# Patient Record
Sex: Female | Born: 1940
Health system: Southern US, Community
[De-identification: ages and names within clinical notes are randomized; demographics above are authoritative.]

## PROBLEM LIST (undated history)

## (undated) DIAGNOSIS — I10 Essential (primary) hypertension: Secondary | ICD-10-CM

## (undated) DIAGNOSIS — Z8719 Personal history of other diseases of the digestive system: Secondary | ICD-10-CM

## (undated) DIAGNOSIS — F32A Depression, unspecified: Secondary | ICD-10-CM

## (undated) DIAGNOSIS — R51 Headache: Secondary | ICD-10-CM

## (undated) DIAGNOSIS — K219 Gastro-esophageal reflux disease without esophagitis: Secondary | ICD-10-CM

## (undated) DIAGNOSIS — F419 Anxiety disorder, unspecified: Secondary | ICD-10-CM

## (undated) DIAGNOSIS — T4145XA Adverse effect of unspecified anesthetic, initial encounter: Secondary | ICD-10-CM

## (undated) DIAGNOSIS — C801 Malignant (primary) neoplasm, unspecified: Secondary | ICD-10-CM

## (undated) DIAGNOSIS — T8859XA Other complications of anesthesia, initial encounter: Secondary | ICD-10-CM

## (undated) DIAGNOSIS — F329 Major depressive disorder, single episode, unspecified: Secondary | ICD-10-CM

## (undated) HISTORY — PX: TEAR DUCT PROBING: SHX793

## (undated) HISTORY — PX: TONSILLECTOMY: SUR1361

## (undated) HISTORY — PX: HERNIA REPAIR: SHX51

## (undated) HISTORY — PX: APPENDECTOMY: SHX54

## (undated) HISTORY — PX: EYE SURGERY: SHX253

## (undated) HISTORY — PX: ABDOMINAL HYSTERECTOMY: SHX81

---

## 1998-01-19 ENCOUNTER — Ambulatory Visit (HOSPITAL_COMMUNITY): Admission: RE | Admit: 1998-01-19 | Discharge: 1998-01-19 | Payer: Self-pay | Admitting: *Deleted

## 1998-07-16 ENCOUNTER — Other Ambulatory Visit: Admission: RE | Admit: 1998-07-16 | Discharge: 1998-07-16 | Payer: Self-pay | Admitting: *Deleted

## 2005-05-15 ENCOUNTER — Emergency Department (HOSPITAL_COMMUNITY): Admission: EM | Admit: 2005-05-15 | Discharge: 2005-05-16 | Payer: Self-pay | Admitting: Emergency Medicine

## 2005-05-20 ENCOUNTER — Emergency Department (HOSPITAL_COMMUNITY): Admission: EM | Admit: 2005-05-20 | Discharge: 2005-05-20 | Payer: Self-pay | Admitting: Emergency Medicine

## 2006-08-04 ENCOUNTER — Encounter: Admission: RE | Admit: 2006-08-04 | Discharge: 2006-11-02 | Payer: Self-pay | Admitting: Cardiology

## 2006-10-06 ENCOUNTER — Ambulatory Visit (HOSPITAL_COMMUNITY): Admission: RE | Admit: 2006-10-06 | Discharge: 2006-10-07 | Payer: Self-pay | Admitting: Ophthalmology

## 2008-01-06 ENCOUNTER — Encounter: Admission: RE | Admit: 2008-01-06 | Discharge: 2008-01-06 | Payer: Self-pay | Admitting: General Surgery

## 2008-11-06 ENCOUNTER — Emergency Department (HOSPITAL_COMMUNITY): Admission: EM | Admit: 2008-11-06 | Discharge: 2008-11-07 | Payer: Self-pay | Admitting: Emergency Medicine

## 2008-11-15 ENCOUNTER — Encounter: Admission: RE | Admit: 2008-11-15 | Discharge: 2008-11-15 | Payer: Self-pay | Admitting: Internal Medicine

## 2009-04-27 ENCOUNTER — Emergency Department (HOSPITAL_COMMUNITY): Admission: EM | Admit: 2009-04-27 | Discharge: 2009-04-27 | Payer: Self-pay | Admitting: Emergency Medicine

## 2009-04-29 ENCOUNTER — Emergency Department (HOSPITAL_COMMUNITY): Admission: EM | Admit: 2009-04-29 | Discharge: 2009-04-29 | Payer: Self-pay | Admitting: Emergency Medicine

## 2009-05-30 ENCOUNTER — Emergency Department (HOSPITAL_COMMUNITY): Admission: EM | Admit: 2009-05-30 | Discharge: 2009-05-30 | Payer: Self-pay | Admitting: Emergency Medicine

## 2009-06-06 ENCOUNTER — Emergency Department (HOSPITAL_COMMUNITY): Admission: EM | Admit: 2009-06-06 | Discharge: 2009-06-06 | Payer: Self-pay | Admitting: Emergency Medicine

## 2009-10-13 ENCOUNTER — Emergency Department (HOSPITAL_COMMUNITY)
Admission: EM | Admit: 2009-10-13 | Discharge: 2009-10-13 | Payer: Self-pay | Source: Home / Self Care | Admitting: Emergency Medicine

## 2009-10-22 ENCOUNTER — Emergency Department (HOSPITAL_COMMUNITY): Admission: EM | Admit: 2009-10-22 | Discharge: 2009-10-22 | Payer: Self-pay | Admitting: Emergency Medicine

## 2009-10-27 ENCOUNTER — Emergency Department (HOSPITAL_COMMUNITY): Admission: EM | Admit: 2009-10-27 | Discharge: 2009-10-27 | Payer: Self-pay | Admitting: Emergency Medicine

## 2009-10-28 ENCOUNTER — Other Ambulatory Visit: Payer: Self-pay

## 2009-10-28 ENCOUNTER — Ambulatory Visit (HOSPITAL_COMMUNITY): Admission: RE | Admit: 2009-10-28 | Discharge: 2009-10-28 | Payer: Self-pay | Admitting: Psychiatry

## 2009-10-29 ENCOUNTER — Ambulatory Visit: Payer: Self-pay | Admitting: Psychiatry

## 2009-10-29 ENCOUNTER — Other Ambulatory Visit: Payer: Self-pay | Admitting: Emergency Medicine

## 2009-10-29 ENCOUNTER — Inpatient Hospital Stay (HOSPITAL_COMMUNITY): Admission: RE | Admit: 2009-10-29 | Discharge: 2009-11-02 | Payer: Self-pay | Admitting: Psychiatry

## 2009-11-19 ENCOUNTER — Other Ambulatory Visit: Payer: Self-pay | Admitting: Emergency Medicine

## 2009-11-20 ENCOUNTER — Inpatient Hospital Stay (HOSPITAL_COMMUNITY): Admission: AD | Admit: 2009-11-20 | Discharge: 2009-11-26 | Payer: Self-pay | Admitting: Psychiatry

## 2009-11-29 ENCOUNTER — Other Ambulatory Visit (HOSPITAL_COMMUNITY): Admission: RE | Admit: 2009-11-29 | Discharge: 2009-12-12 | Payer: Self-pay | Admitting: Psychiatry

## 2009-12-18 ENCOUNTER — Ambulatory Visit: Payer: Self-pay | Admitting: Psychiatry

## 2010-01-12 ENCOUNTER — Other Ambulatory Visit: Payer: Self-pay | Admitting: Emergency Medicine

## 2010-01-13 ENCOUNTER — Inpatient Hospital Stay (HOSPITAL_COMMUNITY): Admission: AD | Admit: 2010-01-13 | Discharge: 2010-01-18 | Payer: Self-pay | Admitting: Psychiatry

## 2010-02-04 ENCOUNTER — Other Ambulatory Visit: Payer: Self-pay

## 2010-02-05 ENCOUNTER — Ambulatory Visit: Payer: Self-pay | Admitting: Psychiatry

## 2010-02-05 ENCOUNTER — Inpatient Hospital Stay (HOSPITAL_COMMUNITY): Admission: EM | Admit: 2010-02-05 | Discharge: 2010-02-12 | Payer: Self-pay | Admitting: Psychiatry

## 2010-02-20 ENCOUNTER — Emergency Department (HOSPITAL_COMMUNITY): Admission: EM | Admit: 2010-02-20 | Discharge: 2010-02-21 | Payer: Self-pay | Admitting: Emergency Medicine

## 2010-03-12 ENCOUNTER — Ambulatory Visit: Payer: Self-pay | Admitting: Psychiatry

## 2010-04-03 ENCOUNTER — Encounter: Admission: RE | Admit: 2010-04-03 | Discharge: 2010-05-30 | Payer: Self-pay | Admitting: Internal Medicine

## 2010-07-04 ENCOUNTER — Emergency Department (HOSPITAL_COMMUNITY): Admission: EM | Admit: 2010-07-04 | Discharge: 2010-07-04 | Payer: Self-pay | Admitting: Emergency Medicine

## 2010-09-08 ENCOUNTER — Encounter: Payer: Self-pay | Admitting: Internal Medicine

## 2010-10-29 LAB — URINALYSIS, ROUTINE W REFLEX MICROSCOPIC
Bilirubin Urine: NEGATIVE
Ketones, ur: NEGATIVE mg/dL
Nitrite: NEGATIVE
Protein, ur: NEGATIVE mg/dL
Specific Gravity, Urine: 1.013 (ref 1.005–1.030)
Urobilinogen, UA: 0.2 mg/dL (ref 0.0–1.0)
pH: 7 (ref 5.0–8.0)

## 2010-10-29 LAB — COMPREHENSIVE METABOLIC PANEL
ALT: 16 U/L (ref 0–35)
Albumin: 3.9 g/dL (ref 3.5–5.2)
Alkaline Phosphatase: 47 U/L (ref 39–117)
CO2: 28 mEq/L (ref 19–32)
Calcium: 9.7 mg/dL (ref 8.4–10.5)
Chloride: 104 mEq/L (ref 96–112)
Sodium: 140 mEq/L (ref 135–145)

## 2010-10-29 LAB — DIFFERENTIAL
Basophils Absolute: 0 10*3/uL (ref 0.0–0.1)
Basophils Relative: 0 % (ref 0–1)
Eosinophils Absolute: 0.1 10*3/uL (ref 0.0–0.7)
Lymphs Abs: 0.9 10*3/uL (ref 0.7–4.0)
Monocytes Absolute: 0.6 10*3/uL (ref 0.1–1.0)
Monocytes Relative: 9 % (ref 3–12)
Neutrophils Relative %: 75 % (ref 43–77)

## 2010-10-29 LAB — URINE MICROSCOPIC-ADD ON

## 2010-10-29 LAB — CBC
MCHC: 34.9 g/dL (ref 30.0–36.0)
RBC: 4.53 MIL/uL (ref 3.87–5.11)

## 2010-11-03 LAB — CBC
HCT: 42.2 % (ref 36.0–46.0)
Hemoglobin: 14.8 g/dL (ref 12.0–15.0)
MCHC: 34 g/dL (ref 30.0–36.0)
MCV: 96.4 fL (ref 78.0–100.0)
MCV: 96 fL (ref 78.0–100.0)
RDW: 12.4 % (ref 11.5–15.5)

## 2010-11-03 LAB — URINALYSIS, ROUTINE W REFLEX MICROSCOPIC
Bilirubin Urine: NEGATIVE
Glucose, UA: NEGATIVE mg/dL
Ketones, ur: NEGATIVE mg/dL
Nitrite: NEGATIVE

## 2010-11-03 LAB — URINE MICROSCOPIC-ADD ON

## 2010-11-03 LAB — URINE CULTURE: Colony Count: 10000

## 2010-11-03 LAB — DIFFERENTIAL
Basophils Absolute: 0 10*3/uL (ref 0.0–0.1)
Basophils Relative: 0 % (ref 0–1)
Eosinophils Absolute: 0 10*3/uL (ref 0.0–0.7)
Eosinophils Relative: 1 % (ref 0–5)
Eosinophils Relative: 1 % (ref 0–5)
Lymphocytes Relative: 19 % (ref 12–46)
Lymphs Abs: 1.2 10*3/uL (ref 0.7–4.0)
Neutro Abs: 5.3 10*3/uL (ref 1.7–7.7)

## 2010-11-03 LAB — TRICYCLICS SCREEN, URINE
TCA Scrn: NOT DETECTED
TCA Scrn: NOT DETECTED

## 2010-11-03 LAB — RAPID URINE DRUG SCREEN, HOSP PERFORMED
Amphetamines: NOT DETECTED
Barbiturates: NOT DETECTED
Barbiturates: NOT DETECTED
Cocaine: NOT DETECTED
Opiates: NOT DETECTED
Tetrahydrocannabinol: NOT DETECTED

## 2010-11-03 LAB — BASIC METABOLIC PANEL
BUN: 20 mg/dL (ref 6–23)
BUN: 17 mg/dL (ref 6–23)
CO2: 26 mEq/L (ref 19–32)
Calcium: 9.7 mg/dL (ref 8.4–10.5)
Calcium: 9.8 mg/dL (ref 8.4–10.5)
Chloride: 99 mEq/L (ref 96–112)
Creatinine, Ser: 0.68 mg/dL (ref 0.4–1.2)
GFR calc non Af Amer: >60 mL/min (ref >60)
Glucose, Bld: 109 mg/dL — ABNORMAL HIGH (ref 70–99)
Potassium: 3.9 mEq/L (ref 3.5–5.1)
Sodium: 137 mEq/L (ref 135–145)

## 2010-11-04 LAB — DIFFERENTIAL
Eosinophils Absolute: 0.1 10*3/uL (ref 0.0–0.7)
Lymphocytes Relative: 16 % (ref 12–46)
Lymphs Abs: 1.2 10*3/uL (ref 0.7–4.0)
Monocytes Relative: 7 % (ref 3–12)
Neutro Abs: 5.3 10*3/uL (ref 1.7–7.7)
Neutrophils Relative %: 76 % (ref 43–77)

## 2010-11-04 LAB — BASIC METABOLIC PANEL
Chloride: 104 mEq/L (ref 96–112)
Creatinine, Ser: 0.72 mg/dL (ref 0.4–1.2)
GFR calc Af Amer: >60 mL/min (ref >60)
GFR calc non Af Amer: >60 mL/min (ref >60)

## 2010-11-04 LAB — ETHANOL: Alcohol, Ethyl (B): <5 mg/dL (ref 0–10)

## 2010-11-04 LAB — URINALYSIS, ROUTINE W REFLEX MICROSCOPIC
Nitrite: NEGATIVE
Specific Gravity, Urine: 1.014 (ref 1.005–1.030)
Urobilinogen, UA: 0.2 mg/dL (ref 0.0–1.0)
pH: 7.5 (ref 5.0–8.0)

## 2010-11-04 LAB — RAPID URINE DRUG SCREEN, HOSP PERFORMED
Benzodiazepines: POSITIVE — AB
Tetrahydrocannabinol: NOT DETECTED

## 2010-11-04 LAB — GLUCOSE, CAPILLARY
Glucose-Capillary: 118 mg/dL — ABNORMAL HIGH (ref 70–99)
Glucose-Capillary: 124 mg/dL — ABNORMAL HIGH (ref 70–99)

## 2010-11-04 LAB — CBC
MCV: 94.9 fL (ref 78.0–100.0)
Platelets: 237 10*3/uL (ref 150–400)
RBC: 4.55 MIL/uL (ref 3.87–5.11)
WBC: 7 10*3/uL (ref 4.0–10.5)

## 2010-11-04 LAB — URINE MICROSCOPIC-ADD ON

## 2010-11-06 LAB — URINALYSIS, ROUTINE W REFLEX MICROSCOPIC
Bilirubin Urine: NEGATIVE
Hgb urine dipstick: NEGATIVE
Hgb urine dipstick: NEGATIVE
Protein, ur: NEGATIVE mg/dL
Specific Gravity, Urine: 1.011 (ref 1.005–1.030)
Urobilinogen, UA: 0.2 mg/dL (ref 0.0–1.0)
Urobilinogen, UA: 0.2 mg/dL (ref 0.0–1.0)
pH: 7 (ref 5.0–8.0)

## 2010-11-06 LAB — CBC
MCHC: 33.7 g/dL (ref 30.0–36.0)
MCV: 95.8 fL (ref 78.0–100.0)
Platelets: 233 10*3/uL (ref 150–400)

## 2010-11-06 LAB — BASIC METABOLIC PANEL
BUN: 19 mg/dL (ref 6–23)
CO2: 29 mEq/L (ref 19–32)
Chloride: 105 mEq/L (ref 96–112)
Creatinine, Ser: 0.72 mg/dL (ref 0.4–1.2)
Glucose, Bld: 111 mg/dL — ABNORMAL HIGH (ref 70–99)

## 2010-11-06 LAB — DIFFERENTIAL
Basophils Relative: 0 % (ref 0–1)
Eosinophils Absolute: 0.1 10*3/uL (ref 0.0–0.7)
Monocytes Relative: 7 % (ref 3–12)
Neutrophils Relative %: 72 % (ref 43–77)

## 2010-11-06 LAB — URINE MICROSCOPIC-ADD ON

## 2010-11-06 LAB — RAPID URINE DRUG SCREEN, HOSP PERFORMED
Barbiturates: NOT DETECTED
Benzodiazepines: NOT DETECTED
Cocaine: NOT DETECTED

## 2010-11-06 LAB — GLUCOSE, CAPILLARY: Glucose-Capillary: 113 mg/dL — ABNORMAL HIGH (ref 70–99)

## 2010-11-10 LAB — POCT CARDIAC MARKERS
CKMB, poc: <1 ng/mL — ABNORMAL LOW (ref 1.0–8.0)
Myoglobin, poc: 59.5 ng/mL (ref 12–200)
Troponin i, poc: <0.05 ng/mL (ref 0.00–0.09)

## 2010-11-10 LAB — CBC
Hemoglobin: 14.4 g/dL (ref 12.0–15.0)
RDW: 11.7 % (ref 11.5–15.5)

## 2010-11-10 LAB — POCT I-STAT, CHEM 8
Glucose, Bld: 108 mg/dL — ABNORMAL HIGH (ref 70–99)
HCT: 42 % (ref 36.0–46.0)
Hemoglobin: 14.3 g/dL (ref 12.0–15.0)
Potassium: 3.8 mEq/L (ref 3.5–5.1)
Sodium: 139 mEq/L (ref 135–145)
TCO2: 28 mmol/L (ref 0–100)

## 2010-11-10 LAB — DIFFERENTIAL
Basophils Absolute: 0 10*3/uL (ref 0.0–0.1)
Lymphocytes Relative: 18 % (ref 12–46)
Monocytes Absolute: 0.6 10*3/uL (ref 0.1–1.0)
Neutro Abs: 5.6 10*3/uL (ref 1.7–7.7)
Neutrophils Relative %: 72 % (ref 43–77)

## 2010-11-11 LAB — CBC
HCT: 43.5 % (ref 36.0–46.0)
HCT: 44.4 % (ref 36.0–46.0)
Hemoglobin: 14.5 g/dL (ref 12.0–15.0)
Hemoglobin: 14.7 g/dL (ref 12.0–15.0)
MCV: 96.8 fL (ref 78.0–100.0)
MCV: 97.3 fL (ref 78.0–100.0)
Platelets: 241 10*3/uL (ref 150–400)
RBC: 4.47 MIL/uL (ref 3.87–5.11)
RDW: 12.1 % (ref 11.5–15.5)
WBC: 6.6 10*3/uL (ref 4.0–10.5)

## 2010-11-11 LAB — DIFFERENTIAL
Basophils Absolute: 0 10*3/uL (ref 0.0–0.1)
Basophils Relative: 0 % (ref 0–1)
Eosinophils Absolute: 0.1 10*3/uL (ref 0.0–0.7)
Eosinophils Relative: 2 % (ref 0–5)
Lymphocytes Relative: 16 % (ref 12–46)
Lymphocytes Relative: 17 % (ref 12–46)
Lymphs Abs: 1.1 10*3/uL (ref 0.7–4.0)
Monocytes Absolute: 0.5 10*3/uL (ref 0.1–1.0)
Monocytes Relative: 7 % (ref 3–12)
Neutro Abs: 4.7 10*3/uL (ref 1.7–7.7)
Neutrophils Relative %: 74 % (ref 43–77)

## 2010-11-11 LAB — COMPREHENSIVE METABOLIC PANEL
Albumin: 4.1 g/dL (ref 3.5–5.2)
Alkaline Phosphatase: 48 U/L (ref 39–117)
BUN: 16 mg/dL (ref 6–23)
Chloride: 104 mEq/L (ref 96–112)
Creatinine, Ser: 0.66 mg/dL (ref 0.4–1.2)
Glucose, Bld: 120 mg/dL — ABNORMAL HIGH (ref 70–99)
Potassium: 3.9 mEq/L (ref 3.5–5.1)
Total Bilirubin: 0.7 mg/dL (ref 0.3–1.2)

## 2010-11-11 LAB — ETHANOL
Alcohol, Ethyl (B): <5 mg/dL (ref 0–10)
Alcohol, Ethyl (B): <5 mg/dL (ref 0–10)

## 2010-11-11 LAB — BASIC METABOLIC PANEL
Chloride: 102 mEq/L (ref 96–112)
GFR calc Af Amer: >60 mL/min (ref >60)
GFR calc non Af Amer: >60 mL/min (ref >60)
Potassium: 4 mEq/L (ref 3.5–5.1)
Sodium: 138 mEq/L (ref 135–145)

## 2010-11-11 LAB — RAPID URINE DRUG SCREEN, HOSP PERFORMED
Barbiturates: NOT DETECTED
Benzodiazepines: NOT DETECTED
Cocaine: NOT DETECTED
Cocaine: NOT DETECTED
Opiates: NOT DETECTED
Tetrahydrocannabinol: NOT DETECTED

## 2010-11-21 LAB — DIFFERENTIAL
Basophils Absolute: 0 10*3/uL (ref 0.0–0.1)
Basophils Relative: 0 % (ref 0–1)
Basophils Relative: 0 % (ref 0–1)
Eosinophils Absolute: 0.1 10*3/uL (ref 0.0–0.7)
Eosinophils Absolute: 0.1 10*3/uL (ref 0.0–0.7)
Eosinophils Relative: 2 % (ref 0–5)
Monocytes Absolute: 0.4 10*3/uL (ref 0.1–1.0)
Monocytes Absolute: 0.5 10*3/uL (ref 0.1–1.0)
Monocytes Relative: 8 % (ref 3–12)
Monocytes Relative: 9 % (ref 3–12)
Neutrophils Relative %: 75 % (ref 43–77)

## 2010-11-21 LAB — CBC
HCT: 44.6 % (ref 36.0–46.0)
Hemoglobin: 15.6 g/dL — ABNORMAL HIGH (ref 12.0–15.0)
Hemoglobin: 14.9 g/dL (ref 12.0–15.0)
Platelets: 212 10*3/uL (ref 150–400)
RBC: 4.71 MIL/uL (ref 3.87–5.11)
RDW: 12.3 % (ref 11.5–15.5)
RDW: 12.2 % (ref 11.5–15.5)
WBC: 5.6 10*3/uL (ref 4.0–10.5)
WBC: 5.7 10*3/uL (ref 4.0–10.5)

## 2010-11-21 LAB — URINE MICROSCOPIC-ADD ON

## 2010-11-21 LAB — COMPREHENSIVE METABOLIC PANEL
ALT: 19 U/L (ref 0–35)
ALT: 18 U/L (ref 0–35)
Albumin: 4.1 g/dL (ref 3.5–5.2)
Alkaline Phosphatase: 44 U/L (ref 39–117)
Alkaline Phosphatase: 49 U/L (ref 39–117)
Chloride: 105 mEq/L (ref 96–112)
Chloride: 101 mEq/L (ref 96–112)
Glucose, Bld: 111 mg/dL — ABNORMAL HIGH (ref 70–99)
Potassium: 3.9 mEq/L (ref 3.5–5.1)
Potassium: 4.1 mEq/L (ref 3.5–5.1)
Sodium: 138 mEq/L (ref 135–145)
Sodium: 138 mEq/L (ref 135–145)
Total Bilirubin: 0.6 mg/dL (ref 0.3–1.2)
Total Bilirubin: 0.5 mg/dL (ref 0.3–1.2)
Total Protein: 6.6 g/dL (ref 6.0–8.3)
Total Protein: 6.6 g/dL (ref 6.0–8.3)

## 2010-11-21 LAB — URINALYSIS, ROUTINE W REFLEX MICROSCOPIC
Bilirubin Urine: NEGATIVE
Bilirubin Urine: NEGATIVE
Glucose, UA: NEGATIVE mg/dL
Glucose, UA: NEGATIVE mg/dL
Hgb urine dipstick: NEGATIVE
Ketones, ur: NEGATIVE mg/dL
Nitrite: NEGATIVE
Specific Gravity, Urine: 1.012 (ref 1.005–1.030)
Specific Gravity, Urine: 1.013 (ref 1.005–1.030)
Urobilinogen, UA: 0.2 mg/dL (ref 0.0–1.0)
pH: 6.5 (ref 5.0–8.0)
pH: 6.5 (ref 5.0–8.0)

## 2010-11-22 LAB — COMPREHENSIVE METABOLIC PANEL
ALT: 19 U/L (ref 0–35)
AST: 19 U/L (ref 0–37)
Albumin: 4.2 g/dL (ref 3.5–5.2)
Calcium: 9.5 mg/dL (ref 8.4–10.5)
GFR calc Af Amer: >60 mL/min (ref >60)
Glucose, Bld: 138 mg/dL — ABNORMAL HIGH (ref 70–99)
Sodium: 138 mEq/L (ref 135–145)
Total Protein: 6.9 g/dL (ref 6.0–8.3)

## 2010-11-22 LAB — URINALYSIS, ROUTINE W REFLEX MICROSCOPIC
Bilirubin Urine: NEGATIVE
Bilirubin Urine: NEGATIVE
Glucose, UA: NEGATIVE mg/dL
Ketones, ur: NEGATIVE mg/dL
Ketones, ur: NEGATIVE mg/dL
Nitrite: NEGATIVE
Specific Gravity, Urine: 1.016 (ref 1.005–1.030)
Urobilinogen, UA: 0.2 mg/dL (ref 0.0–1.0)
pH: 6 (ref 5.0–8.0)

## 2010-11-22 LAB — RAPID URINE DRUG SCREEN, HOSP PERFORMED
Amphetamines: NOT DETECTED
Barbiturates: NOT DETECTED
Benzodiazepines: POSITIVE — AB
Opiates: NOT DETECTED

## 2010-11-22 LAB — URINE CULTURE: Culture: NO GROWTH

## 2010-11-22 LAB — DIFFERENTIAL
Eosinophils Absolute: 0.1 10*3/uL (ref 0.0–0.7)
Eosinophils Relative: 2 % (ref 0–5)
Lymphocytes Relative: 15 % (ref 12–46)
Lymphs Abs: 1 10*3/uL (ref 0.7–4.0)
Lymphs Abs: 1.1 10*3/uL (ref 0.7–4.0)
Monocytes Absolute: 0.5 10*3/uL (ref 0.1–1.0)
Monocytes Absolute: 0.6 10*3/uL (ref 0.1–1.0)
Monocytes Relative: 6 % (ref 3–12)
Neutrophils Relative %: 79 % — ABNORMAL HIGH (ref 43–77)

## 2010-11-22 LAB — CBC
HCT: 42.1 % (ref 36.0–46.0)
Hemoglobin: 14.4 g/dL (ref 12.0–15.0)
MCHC: 34.2 g/dL (ref 30.0–36.0)
Platelets: 212 10*3/uL (ref 150–400)
RDW: 12.1 % (ref 11.5–15.5)
WBC: 7 10*3/uL (ref 4.0–10.5)

## 2010-11-22 LAB — POCT CARDIAC MARKERS: CKMB, poc: <1 ng/mL — ABNORMAL LOW (ref 1.0–8.0)

## 2010-11-22 LAB — BASIC METABOLIC PANEL
GFR calc Af Amer: >60 mL/min (ref >60)
GFR calc non Af Amer: >60 mL/min (ref >60)
Glucose, Bld: 126 mg/dL — ABNORMAL HIGH (ref 70–99)
Potassium: 4.1 mEq/L (ref 3.5–5.1)
Sodium: 137 mEq/L (ref 135–145)

## 2010-11-22 LAB — BRAIN NATRIURETIC PEPTIDE: Pro B Natriuretic peptide (BNP): 38 pg/mL (ref 0.0–100.0)

## 2010-11-28 LAB — BASIC METABOLIC PANEL
BUN: 18 mg/dL (ref 6–23)
CO2: 24 mEq/L (ref 19–32)
Calcium: 9.8 mg/dL (ref 8.4–10.5)
Creatinine, Ser: 0.67 mg/dL (ref 0.4–1.2)
GFR calc non Af Amer: >60 mL/min (ref >60)
Glucose, Bld: 157 mg/dL — ABNORMAL HIGH (ref 70–99)

## 2010-11-28 LAB — CBC
MCHC: 35.5 g/dL (ref 30.0–36.0)
Platelets: 257 10*3/uL (ref 150–400)
RDW: 11.5 % (ref 11.5–15.5)

## 2010-11-28 LAB — POCT CARDIAC MARKERS
Myoglobin, poc: 142 ng/mL (ref 12–200)
Troponin i, poc: <0.05 ng/mL (ref 0.00–0.09)

## 2010-11-28 LAB — URINALYSIS, ROUTINE W REFLEX MICROSCOPIC
Bilirubin Urine: NEGATIVE
Nitrite: NEGATIVE
Specific Gravity, Urine: 1.015 (ref 1.005–1.030)
pH: 7 (ref 5.0–8.0)

## 2010-11-28 LAB — URINE MICROSCOPIC-ADD ON

## 2010-11-28 LAB — DIFFERENTIAL
Eosinophils Absolute: 0.1 10*3/uL (ref 0.0–0.7)
Eosinophils Relative: 1 % (ref 0–5)
Lymphocytes Relative: 16 % (ref 12–46)
Lymphs Abs: 1.4 10*3/uL (ref 0.7–4.0)
Monocytes Relative: 11 % (ref 3–12)
Neutrophils Relative %: 71 % (ref 43–77)

## 2011-01-03 NOTE — Op Note (Signed)
NAMEJAMILYN, Monica Faulkner NO.:  [id number]   MEDICAL RECORD NO.:  [id number]          PATIENT TYPE:  AMB   LOCATION:  SDS                          FACILITY:  [facility]   PHYSICIAN:  John D. Ashley Royalty, M.D. DATE OF BIRTH:  Jun 11, 1941   DATE OF PROCEDURE:  10/06/2006  DATE OF DISCHARGE:                               OPERATIVE REPORT   ADMISSION DIAGNOSIS:  Macular hole right eye.   PROCEDURES:  Are pars plana vitrectomy with membrane peel, retinal  photocoagulation, gas fluid exchange with Perfluoron propane, serum  patch, and membrane peel, all in the right eye.   SURGEON:  Dr. Alan Mulder.   ASSISTANT:  Rosalie Doctor, MA   ANESTHESIA:  Is general.   DETAILS:  Usual prep and drape, peritomies at 8, 10, and 2 o'clock.  The  5-mm infusion port anchored into place at 8 o'clock.  The lighted pick  and the cutter placed at 10 and 2 o'clock respectively.  Contact lens  ring anchored into place at 6 and 12 o'clock.  Provisc placed on the  corneal surface and the flat contact lens was placed.  Pars plana  vitrectomy was begun just behind the crystalline lens. Membranes and  blood were encountered.  These were carefully removed under low suction  and rapid cutting.  The core vitrectomy was completed.  The silicone tip  suction line was placed into the eye, drawn down to the macular surface  where the fish strike sign occurred centrally and at the edge of the  macular region.  The posterior hyaloid was teased up and stripped from  its attachments to the macular hole with the lighted pick and the  cutter.  The vitrectomy is carried out to the far periphery with a 30  degrees prismatic lens. The magnifying contact lens was then placed and  the diamond dusted membrane scraper was used to remove the internal  limiting membrane from the edges of the hole.  Once this was complete  the instruments removed from the eye.  The indirect ophthalmoscope laser  was moved into place, 847  burns placed around the periphery with a power  of 400-560 milliwatts, 1000 microns each and 0.05-0.07 seconds each.  The instruments were repositioned in the eye and a gas fluid exchange  was carried out.  Sufficient time was allowed for additional fluids  track down the walls of the eye and collect in the posterior pole.  The  serum patch was prepared during this time in the Perfluoron propane was  mixed to 16%.  The additional fluid was removed with a [place] [employer]  brush.  Serum patch was delivered.  The Perfluoron propane was exchanged  for intravitreal gas.  The instruments were removed from the eye and 9-0  nylon was used to close the sclerotomy sites.  The conjunctiva was  closed with wet-field cautery.  Polymyxin and gentamicin were irrigated  into tenons space.  A Decadron 10 mg was injected to the lower  subconjunctival space.  Atropine solution was applied.  Marcaine was  injected around the globe for  postoperative  pain.  TobraDex ophthalmic ointment, patch and shield were  placed.  Closing pressure was 10 with a [place] tonometer.  Complications none.  Duration 1 hour.  The patient is awakened, taken to  recovery in satisfactory condition.      Beulah Gandy. Ashley Royalty, M.D.  Electronically Signed     JDM/MEDQ  D:  10/06/2006  T:  10/06/2006  Job:  [id number]

## 2011-06-13 ENCOUNTER — Emergency Department (HOSPITAL_COMMUNITY)
Admission: EM | Admit: 2011-06-13 | Discharge: 2011-06-13 | Disposition: A | Discharge status: Home / Self Care | Payer: Medicare Other | Attending: Emergency Medicine | Admitting: Emergency Medicine

## 2011-06-13 ENCOUNTER — Emergency Department (HOSPITAL_COMMUNITY): Payer: Medicare Other

## 2011-06-13 DIAGNOSIS — R112 Nausea with vomiting, unspecified: Secondary | ICD-10-CM | POA: Insufficient documentation

## 2011-06-13 DIAGNOSIS — I10 Essential (primary) hypertension: Secondary | ICD-10-CM | POA: Insufficient documentation

## 2011-06-13 DIAGNOSIS — R1013 Epigastric pain: Secondary | ICD-10-CM | POA: Insufficient documentation

## 2011-06-13 DIAGNOSIS — F411 Generalized anxiety disorder: Secondary | ICD-10-CM | POA: Insufficient documentation

## 2011-06-13 DIAGNOSIS — R1011 Right upper quadrant pain: Secondary | ICD-10-CM | POA: Insufficient documentation

## 2011-06-13 LAB — CBC
Hemoglobin: 14.2 g/dL (ref 12.0–15.0)
MCH: 30.9 pg (ref 26.0–34.0)
Platelets: 260 10*3/uL (ref 150–400)
RBC: 4.6 MIL/uL (ref 3.87–5.11)
WBC: 5.9 10*3/uL (ref 4.0–10.5)

## 2011-06-13 LAB — URINE MICROSCOPIC-ADD ON

## 2011-06-13 LAB — COMPREHENSIVE METABOLIC PANEL
ALT: 22 U/L (ref 0–35)
AST: 19 U/L (ref 0–37)
Albumin: 3.4 g/dL — ABNORMAL LOW (ref 3.5–5.2)
Alkaline Phosphatase: 64 U/L (ref 39–117)
Calcium: 9.7 mg/dL (ref 8.4–10.5)
GFR calc Af Amer: >90 mL/min (ref >90)
Glucose, Bld: 120 mg/dL — ABNORMAL HIGH (ref 70–99)
Potassium: 4.1 mEq/L (ref 3.5–5.1)
Sodium: 134 mEq/L — ABNORMAL LOW (ref 135–145)
Total Protein: 6.5 g/dL (ref 6.0–8.3)

## 2011-06-13 LAB — URINALYSIS, ROUTINE W REFLEX MICROSCOPIC
Bilirubin Urine: NEGATIVE
Hgb urine dipstick: NEGATIVE
Specific Gravity, Urine: 1.012 (ref 1.005–1.030)
Urobilinogen, UA: 0.2 mg/dL (ref 0.0–1.0)
pH: 7 (ref 5.0–8.0)

## 2011-06-13 LAB — DIFFERENTIAL
Basophils Absolute: 0 10*3/uL (ref 0.0–0.1)
Basophils Relative: 0 % (ref 0–1)
Eosinophils Absolute: 0.1 10*3/uL (ref 0.0–0.7)
Monocytes Relative: 14 % — ABNORMAL HIGH (ref 3–12)
Neutro Abs: 3.8 10*3/uL (ref 1.7–7.7)
Neutrophils Relative %: 64 % (ref 43–77)

## 2011-06-13 LAB — PROTIME-INR
INR: 1.03 (ref 0.00–1.49)
Prothrombin Time: 13.7 seconds (ref 11.6–15.2)

## 2011-06-13 LAB — APTT: aPTT: 31 seconds (ref 24–37)

## 2011-06-13 MED ORDER — IOHEXOL 300 MG/ML  SOLN
100.0000 mL | Freq: Once | INTRAMUSCULAR | Status: AC | PRN
  Administered 2011-06-13: 100 mL via INTRAVENOUS

## 2011-08-08 IMAGING — CT CT HEAD W/O CM
1 series · 16 of 30 positions shown, 20 images · non-contrast
Comparison: 11/06/2008

CLINICAL DATA: Nausea, ringing in left the year, dizziness, left
ear pain

CT HEAD WITHOUT CONTRAST
TECHNIQUE: Contiguous axial images were obtained from the base of
the skull through the vertex without contrast.

[Series 2: head routine 4.8 h37s · axial · 0.48mm/px · z∈[-66,+72]mm · 16 of 30 slices shown, 20 images]
[im 2/30  brain]
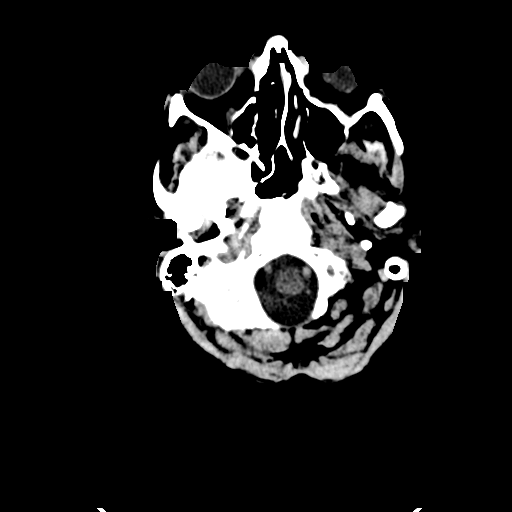
[im 2/30  bone]
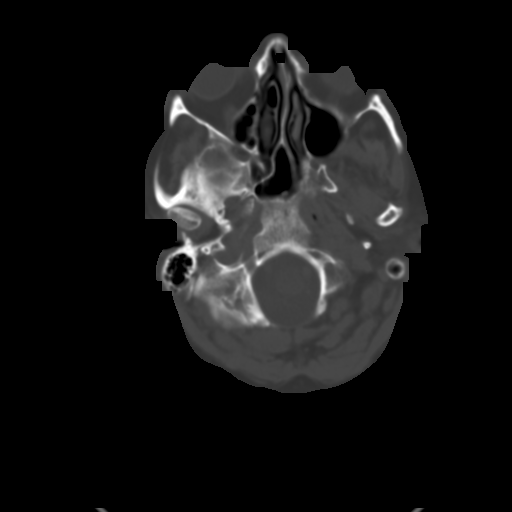
[im 4/30  brain]
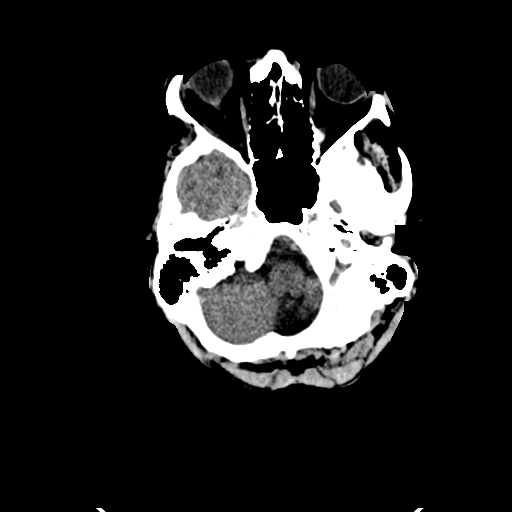
[im 6/30  brain]
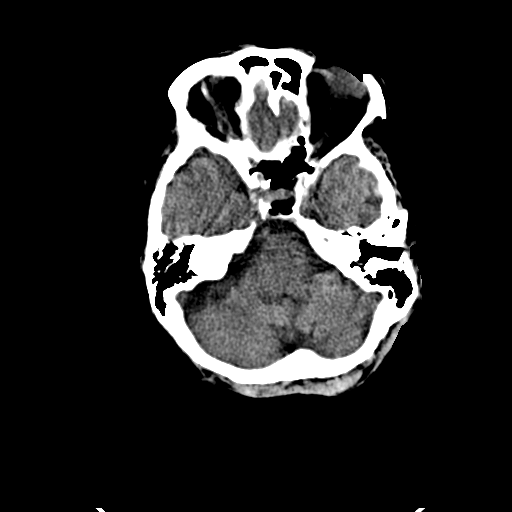
[im 8/30  brain]
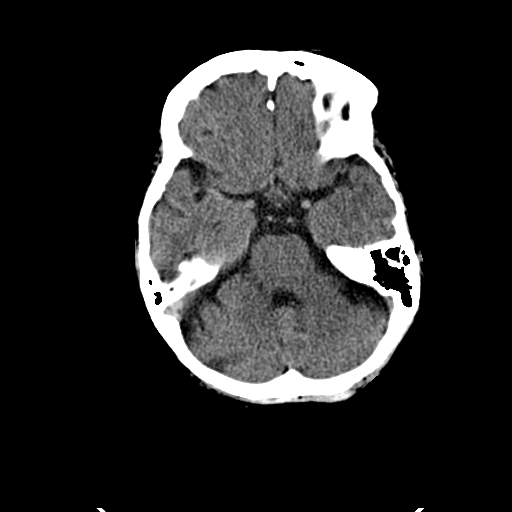
[im 9/30  brain]
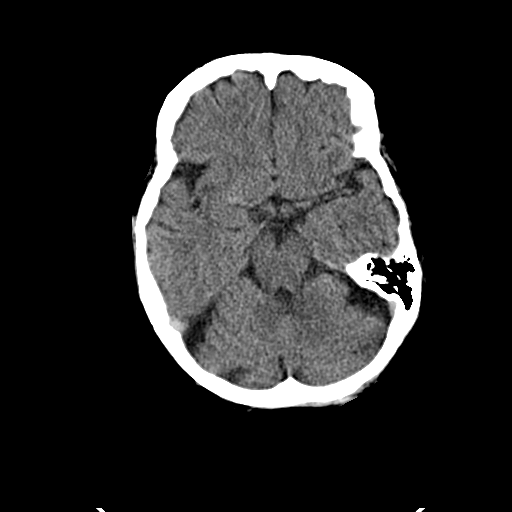
[im 9/30  bone]
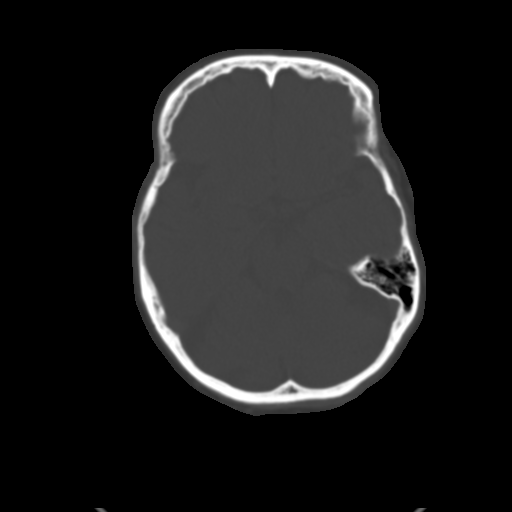
[im 11/30  brain]
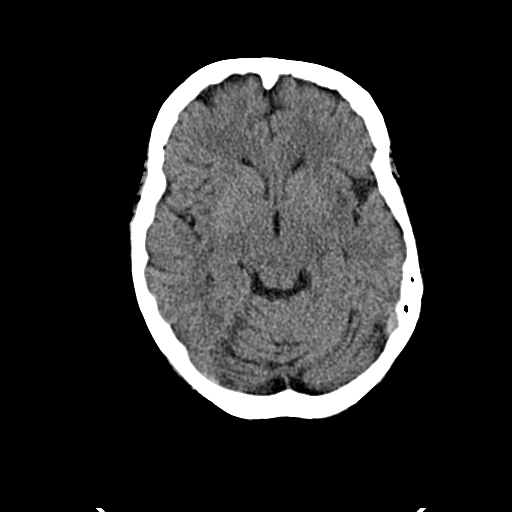
[im 13/30  brain]
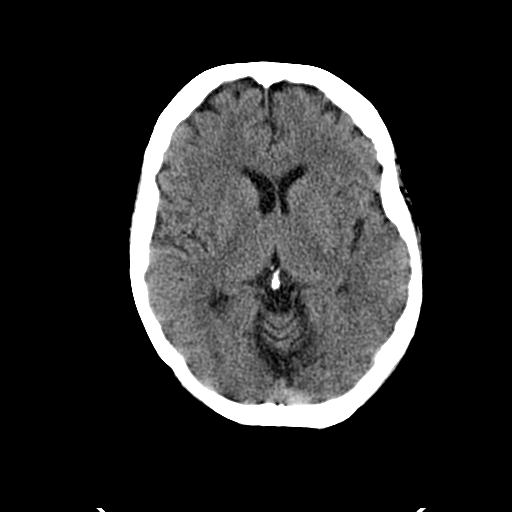
[im 15/30  brain]
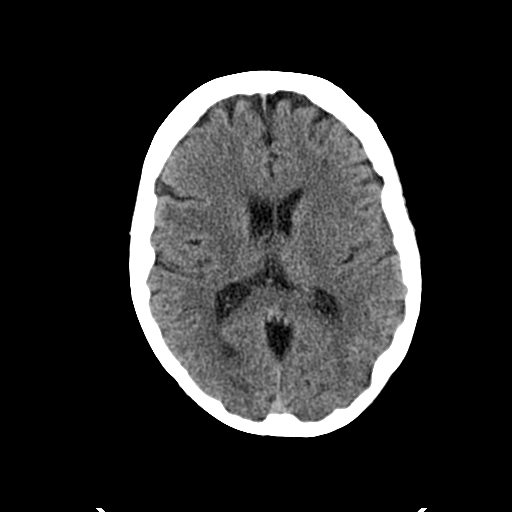
[im 16/30  brain]
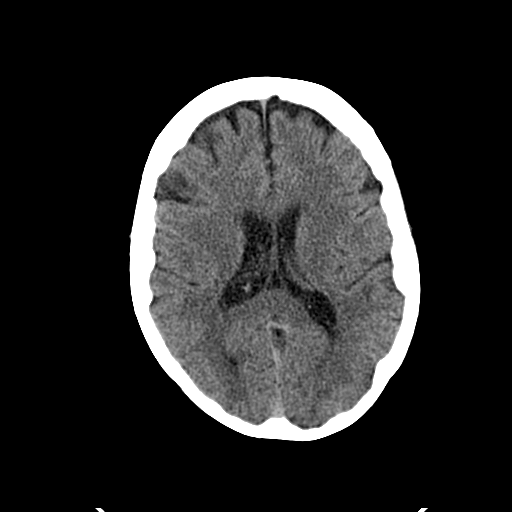
[im 16/30  bone]
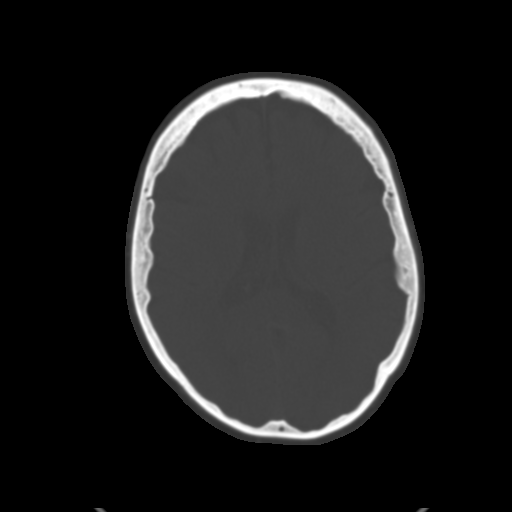
[im 18/30  brain]
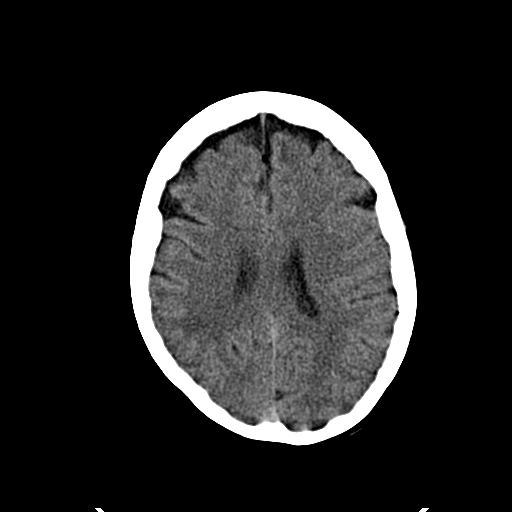
[im 20/30  brain]
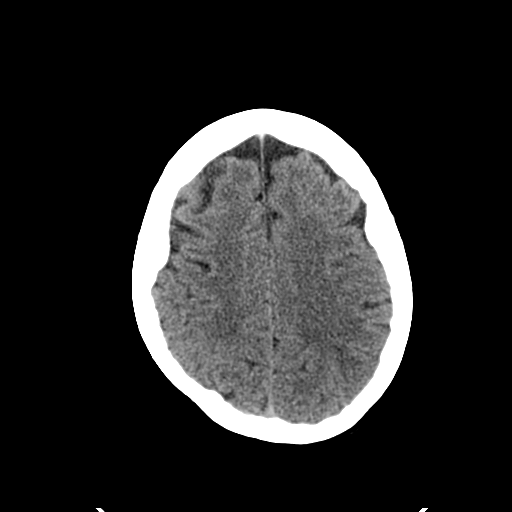
[im 22/30  brain]
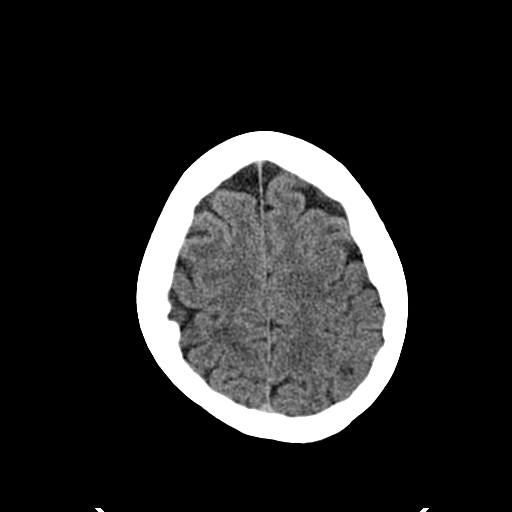
[im 23/30  brain]
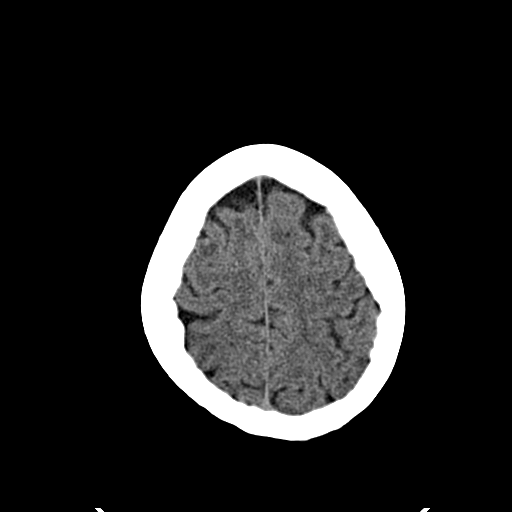
[im 23/30  bone]
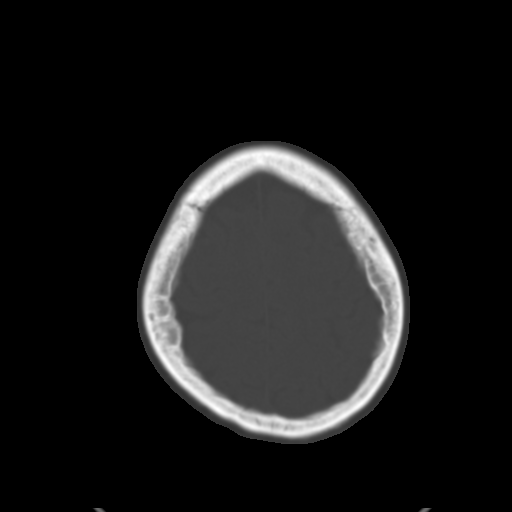
[im 25/30  brain]
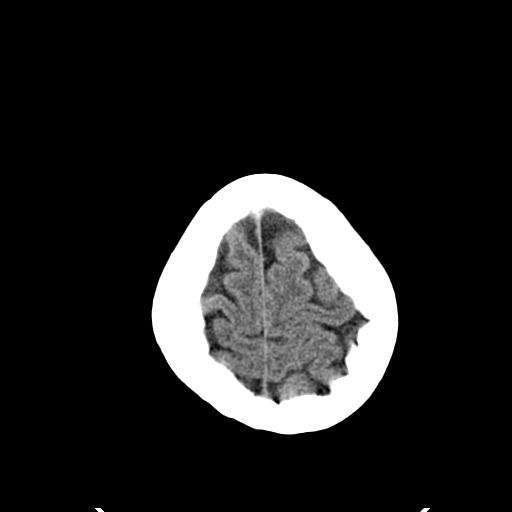
[im 27/30  brain]
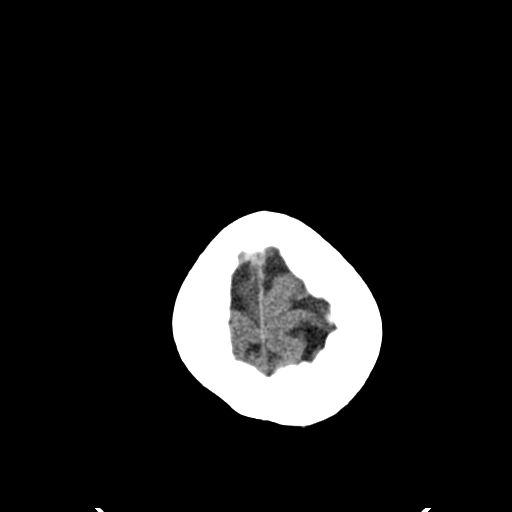
[im 29/30  brain]
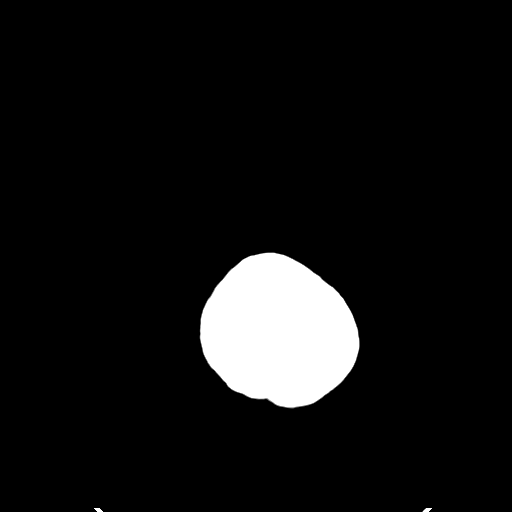

[16 of 30 positions shown; findings below may reference images not displayed]

FINDINGS: Mild generalized atrophy.
Normal ventricular morphology.
No midline shift or mass effect.
Minimal small vessel chronic ischemic changes of deep cerebral
white matter.
Otherwise normal appearance of brain parenchyma.
No intracranial hemorrhage, mass lesion, or acute infarction.
Visualized paranasal sinuses clear.
Bones unremarkable.
IMPRESSION: Atrophy with minimal small vessel chronic ischemic changes of deep
cerebral white matter.
No acute intracranial abnormalities.

## 2011-09-29 ENCOUNTER — Encounter (INDEPENDENT_AMBULATORY_CARE_PROVIDER_SITE_OTHER): Payer: BC Managed Care – PPO | Admitting: Ophthalmology

## 2011-10-13 ENCOUNTER — Encounter (INDEPENDENT_AMBULATORY_CARE_PROVIDER_SITE_OTHER): Payer: BC Managed Care – PPO | Admitting: Ophthalmology

## 2012-01-08 ENCOUNTER — Encounter (INDEPENDENT_AMBULATORY_CARE_PROVIDER_SITE_OTHER): Payer: Medicare Other | Admitting: Ophthalmology

## 2012-02-04 ENCOUNTER — Encounter (INDEPENDENT_AMBULATORY_CARE_PROVIDER_SITE_OTHER): Payer: Medicare Other | Admitting: Ophthalmology

## 2012-02-16 ENCOUNTER — Encounter (INDEPENDENT_AMBULATORY_CARE_PROVIDER_SITE_OTHER): Payer: Medicare Other | Admitting: Ophthalmology

## 2012-02-16 DIAGNOSIS — H43819 Vitreous degeneration, unspecified eye: Secondary | ICD-10-CM

## 2012-02-16 DIAGNOSIS — H2510 Age-related nuclear cataract, unspecified eye: Secondary | ICD-10-CM

## 2012-02-16 DIAGNOSIS — H35349 Macular cyst, hole, or pseudohole, unspecified eye: Secondary | ICD-10-CM

## 2012-02-18 DIAGNOSIS — H35349 Macular cyst, hole, or pseudohole, unspecified eye: Secondary | ICD-10-CM | POA: Diagnosis present

## 2012-02-18 NOTE — H&P (Signed)
Monica Faulkner is an 71 y.o. female.   Chief Complaint:Loss of vision left eye HPI: longstanding macular hole left eye.  Has successful repair of macular hole in right eye  No past medical history on file.   past surgical history:  Tear Duct surgery, Tonsils and Adnoids surgery, ovarian cyst surgery, Hernia surgery, right eye macular hole repair surgery   No family history on file. Social History:  does not have a smoking history on file. She does not have any smokeless tobacco history on file. Her alcohol and drug histories not on file.  Allergies: Allergies not on file  No prescriptions prior to admission    Review of systems otherwise negative  There were no vitals taken for this visit.  Physical exam: Mental status: oriented x3. Eyes: See eye exam associated with this date of surgery in media tab.  Scanned in by scanning center Ears, Nose, Throat: within normal limits Neck: Within Normal limits General: within normal limits Chest: Within normal limits Breast: deferred Heart: Within normal limits Abdomen: Within normal limits GU: deferred Extremities: within normal limits Skin: within normal limits  Assessment/Plan Macular hole left eye Plan: To [facility] for Pars plana vitrectomy, membrane peel, serum patch, gas injection left eye  Orin Eberwein D 02/18/2012, 12:02 PM

## 2012-03-08 ENCOUNTER — Encounter (HOSPITAL_COMMUNITY): Payer: Self-pay | Admitting: Respiratory Therapy

## 2012-03-16 ENCOUNTER — Encounter (HOSPITAL_COMMUNITY): Payer: Self-pay

## 2012-03-17 MED ORDER — CYCLOPENTOLATE HCL 1 % OP SOLN
1.0000 [drp] | OPHTHALMIC | Status: DC | PRN
  Filled 2012-03-17: qty 2

## 2012-03-17 MED ORDER — CEFAZOLIN SODIUM-DEXTROSE 2-3 GM-% IV SOLR
2.0000 g | INTRAVENOUS | Status: DC
  Filled 2012-03-17: qty 50

## 2012-03-17 MED ORDER — PHENYLEPHRINE HCL 2.5 % OP SOLN
1.0000 [drp] | OPHTHALMIC | Status: DC | PRN
  Filled 2012-03-17: qty 3

## 2012-03-17 MED ORDER — TROPICAMIDE 1 % OP SOLN
1.0000 [drp] | OPHTHALMIC | Status: DC | PRN
  Filled 2012-03-17: qty 3

## 2012-03-17 MED ORDER — GATIFLOXACIN 0.5 % OP SOLN
1.0000 [drp] | OPHTHALMIC | Status: DC | PRN
  Filled 2012-03-17: qty 2.5

## 2012-03-18 ENCOUNTER — Encounter (HOSPITAL_COMMUNITY): Payer: Self-pay | Admitting: General Practice

## 2012-03-18 ENCOUNTER — Encounter (HOSPITAL_COMMUNITY)
Admission: RE | Disposition: A | Discharge status: Home / Self Care | Payer: Self-pay | Source: Ambulatory Visit | Attending: Ophthalmology

## 2012-03-18 ENCOUNTER — Encounter (HOSPITAL_COMMUNITY): Payer: Self-pay | Admitting: *Deleted

## 2012-03-18 ENCOUNTER — Ambulatory Visit (HOSPITAL_COMMUNITY)
Admission: RE | Admit: 2012-03-18 | Discharge: 2012-03-19 | Disposition: A | Discharge status: Home / Self Care | Payer: Medicare Other | Source: Ambulatory Visit | Attending: Ophthalmology | Admitting: Ophthalmology

## 2012-03-18 ENCOUNTER — Ambulatory Visit (HOSPITAL_COMMUNITY): Payer: Medicare Other | Admitting: *Deleted

## 2012-03-18 ENCOUNTER — Ambulatory Visit (HOSPITAL_COMMUNITY): Payer: Medicare Other

## 2012-03-18 DIAGNOSIS — H35349 Macular cyst, hole, or pseudohole, unspecified eye: Secondary | ICD-10-CM

## 2012-03-18 DIAGNOSIS — F329 Major depressive disorder, single episode, unspecified: Secondary | ICD-10-CM | POA: Insufficient documentation

## 2012-03-18 DIAGNOSIS — I10 Essential (primary) hypertension: Secondary | ICD-10-CM | POA: Insufficient documentation

## 2012-03-18 DIAGNOSIS — F3289 Other specified depressive episodes: Secondary | ICD-10-CM | POA: Insufficient documentation

## 2012-03-18 DIAGNOSIS — K219 Gastro-esophageal reflux disease without esophagitis: Secondary | ICD-10-CM | POA: Insufficient documentation

## 2012-03-18 DIAGNOSIS — F411 Generalized anxiety disorder: Secondary | ICD-10-CM | POA: Insufficient documentation

## 2012-03-18 HISTORY — DX: Adverse effect of unspecified anesthetic, initial encounter: T41.45XA

## 2012-03-18 HISTORY — DX: Anxiety disorder, unspecified: F41.9

## 2012-03-18 HISTORY — DX: Essential (primary) hypertension: I10

## 2012-03-18 HISTORY — DX: Depression, unspecified: F32.A

## 2012-03-18 HISTORY — DX: Malignant (primary) neoplasm, unspecified: C80.1

## 2012-03-18 HISTORY — PX: GAS INSERTION: SHX5336

## 2012-03-18 HISTORY — DX: Major depressive disorder, single episode, unspecified: F32.9

## 2012-03-18 HISTORY — DX: Gastro-esophageal reflux disease without esophagitis: K21.9

## 2012-03-18 HISTORY — DX: Other complications of anesthesia, initial encounter: T88.59XA

## 2012-03-18 HISTORY — DX: Headache: R51

## 2012-03-18 HISTORY — PX: PARS PLANA VITRECTOMY: SHX2166

## 2012-03-18 HISTORY — PX: VITRECTOMY: SHX106

## 2012-03-18 HISTORY — DX: Personal history of other diseases of the digestive system: Z87.19

## 2012-03-18 LAB — CBC
HCT: 41.3 % (ref 36.0–46.0)
Hemoglobin: 14 g/dL (ref 12.0–15.0)
MCV: 89 fL (ref 78.0–100.0)
RBC: 4.64 MIL/uL (ref 3.87–5.11)
WBC: 7 10*3/uL (ref 4.0–10.5)

## 2012-03-18 LAB — AUTOLOGOUS SERUM PATCH PREP

## 2012-03-18 LAB — BASIC METABOLIC PANEL
CO2: 28 mEq/L (ref 19–32)
Chloride: 103 mEq/L (ref 96–112)
Creatinine, Ser: 0.67 mg/dL (ref 0.50–1.10)
GFR calc Af Amer: >90 mL/min (ref >90)
Potassium: 4 mEq/L (ref 3.5–5.1)
Sodium: 139 mEq/L (ref 135–145)

## 2012-03-18 LAB — SURGICAL PCR SCREEN: Staphylococcus aureus: NEGATIVE

## 2012-03-18 SURGERY — PARS PLANA VITRECTOMY WITH 25 GAUGE
Anesthesia: General | Site: Eye | Laterality: Left | Wound class: Clean

## 2012-03-18 MED ORDER — STERILE WATER FOR IRRIGATION IR SOLN
Status: DC | PRN
  Administered 2012-03-18: 1000 mL

## 2012-03-18 MED ORDER — EPHEDRINE SULFATE 50 MG/ML IJ SOLN
INTRAMUSCULAR | Status: DC | PRN
  Administered 2012-03-18: 10 mg via INTRAVENOUS
  Administered 2012-03-18: 5 mg via INTRAVENOUS

## 2012-03-18 MED ORDER — FENTANYL CITRATE 0.05 MG/ML IJ SOLN
INTRAMUSCULAR | Status: DC | PRN
  Administered 2012-03-18: 50 ug via INTRAVENOUS
  Administered 2012-03-18: 150 ug via INTRAVENOUS

## 2012-03-18 MED ORDER — DEXAMETHASONE SODIUM PHOSPHATE 10 MG/ML IJ SOLN
INTRAMUSCULAR | Status: AC
  Filled 2012-03-18: qty 1

## 2012-03-18 MED ORDER — GATIFLOXACIN 0.5 % OP SOLN
1.0000 [drp] | Freq: Four times a day (QID) | OPHTHALMIC | Status: DC
  Administered 2012-03-19: 1 [drp] via OPHTHALMIC
  Filled 2012-03-18: qty 2.5

## 2012-03-18 MED ORDER — LISINOPRIL 40 MG PO TABS
40.0000 mg | ORAL_TABLET | Freq: Two times a day (BID) | ORAL | Status: DC
  Administered 2012-03-18 – 2012-03-19 (×2): 40 mg via ORAL
  Filled 2012-03-18 (×4): qty 1

## 2012-03-18 MED ORDER — ONDANSETRON HCL 4 MG/2ML IJ SOLN: 4.0000 mg | Freq: Once | INTRAMUSCULAR | Status: DC | PRN

## 2012-03-18 MED ORDER — ACETAMINOPHEN 325 MG PO TABS: 325.0000 mg | ORAL_TABLET | ORAL | Status: DC | PRN

## 2012-03-18 MED ORDER — BSS PLUS IO SOLN
INTRAOCULAR | Status: DC | PRN
  Administered 2012-03-18: 1 via INTRAOCULAR

## 2012-03-18 MED ORDER — LATANOPROST 0.005 % OP SOLN
1.0000 [drp] | Freq: Every day | OPHTHALMIC | Status: DC
  Filled 2012-03-18: qty 2.5

## 2012-03-18 MED ORDER — HYDROMORPHONE HCL PF 1 MG/ML IJ SOLN
0.2500 mg | INTRAMUSCULAR | Status: DC | PRN
  Administered 2012-03-18: 0.5 mg via INTRAVENOUS

## 2012-03-18 MED ORDER — BACITRACIN-POLYMYXIN B 500-10000 UNIT/GM OP OINT
1.0000 "application " | TOPICAL_OINTMENT | Freq: Four times a day (QID) | OPHTHALMIC | Status: DC
  Administered 2012-03-19: 1 via OPHTHALMIC
  Filled 2012-03-18: qty 3.5

## 2012-03-18 MED ORDER — EPINEPHRINE HCL 1 MG/ML IJ SOLN
INTRAMUSCULAR | Status: AC
  Filled 2012-03-18: qty 1

## 2012-03-18 MED ORDER — BACITRACIN-POLYMYXIN B 500-10000 UNIT/GM OP OINT
TOPICAL_OINTMENT | OPHTHALMIC | Status: AC
  Filled 2012-03-18: qty 3.5

## 2012-03-18 MED ORDER — POLYMYXIN B SULFATE 500000 UNITS IJ SOLR
INTRAMUSCULAR | Status: AC
  Filled 2012-03-18: qty 1

## 2012-03-18 MED ORDER — SODIUM CHLORIDE 0.45 % IV SOLN
INTRAVENOUS | Status: DC
  Administered 2012-03-19: 02:00:00 via INTRAVENOUS

## 2012-03-18 MED ORDER — SODIUM CHLORIDE 0.9 % IV SOLN
INTRAVENOUS | Status: DC
  Administered 2012-03-18: 12:00:00 via INTRAVENOUS

## 2012-03-18 MED ORDER — PROPOFOL 10 MG/ML IV EMUL
INTRAVENOUS | Status: DC | PRN
  Administered 2012-03-18: 110 mg via INTRAVENOUS

## 2012-03-18 MED ORDER — TETRACAINE HCL 0.5 % OP SOLN
2.0000 [drp] | Freq: Once | OPHTHALMIC | Status: DC
  Filled 2012-03-18: qty 2

## 2012-03-18 MED ORDER — ATROPINE SULFATE 1 % OP SOLN
OPHTHALMIC | Status: AC
  Filled 2012-03-18: qty 2

## 2012-03-18 MED ORDER — POLYMYXIN B SULFATE 500000 UNITS IJ SOLR
INTRAMUSCULAR | Status: DC | PRN
  Administered 2012-03-18: 12:00:00

## 2012-03-18 MED ORDER — DEXAMETHASONE SODIUM PHOSPHATE 10 MG/ML IJ SOLN
INTRAMUSCULAR | Status: DC | PRN
  Administered 2012-03-18: 10 mg

## 2012-03-18 MED ORDER — PROVISC 10 MG/ML IO SOLN
INTRAOCULAR | Status: DC | PRN
  Administered 2012-03-18: .85 mL via INTRAOCULAR

## 2012-03-18 MED ORDER — HYDROMORPHONE HCL PF 1 MG/ML IJ SOLN
INTRAMUSCULAR | Status: AC
  Filled 2012-03-18: qty 1

## 2012-03-18 MED ORDER — BUPIVACAINE HCL 0.75 % IJ SOLN
INTRAMUSCULAR | Status: AC
  Filled 2012-03-18: qty 10

## 2012-03-18 MED ORDER — ESCITALOPRAM OXALATE 10 MG PO TABS
10.0000 mg | ORAL_TABLET | Freq: Every day | ORAL | Status: DC
  Administered 2012-03-19: 10 mg via ORAL
  Filled 2012-03-18: qty 1

## 2012-03-18 MED ORDER — SODIUM CHLORIDE 0.9 % IR SOLN
Status: DC | PRN
  Administered 2012-03-18: 1

## 2012-03-18 MED ORDER — BSS IO SOLN
INTRAOCULAR | Status: DC | PRN
  Administered 2012-03-18: 15 mL via INTRAOCULAR

## 2012-03-18 MED ORDER — VECURONIUM BROMIDE 10 MG IV SOLR
INTRAVENOUS | Status: DC | PRN
  Administered 2012-03-18: 1 mg via INTRAVENOUS

## 2012-03-18 MED ORDER — PHENYLEPHRINE HCL 2.5 % OP SOLN
1.0000 [drp] | OPHTHALMIC | Status: AC | PRN
  Administered 2012-03-18 (×3): 1 [drp] via OPHTHALMIC

## 2012-03-18 MED ORDER — ACETAZOLAMIDE SODIUM 500 MG IJ SOLR
500.0000 mg | Freq: Once | INTRAMUSCULAR | Status: AC
  Administered 2012-03-19: 500 mg via INTRAVENOUS
  Filled 2012-03-18: qty 500

## 2012-03-18 MED ORDER — MUPIROCIN 2 % EX OINT
TOPICAL_OINTMENT | CUTANEOUS | Status: AC
  Administered 2012-03-18: 1 via NASAL
  Filled 2012-03-18: qty 22

## 2012-03-18 MED ORDER — GATIFLOXACIN 0.5 % OP SOLN
1.0000 [drp] | OPHTHALMIC | Status: DC | PRN
  Administered 2012-03-18 (×2): 1 [drp] via OPHTHALMIC

## 2012-03-18 MED ORDER — TROPICAMIDE 1 % OP SOLN
1.0000 [drp] | OPHTHALMIC | Status: AC | PRN
  Administered 2012-03-18 (×3): 1 [drp] via OPHTHALMIC

## 2012-03-18 MED ORDER — DOCUSATE SODIUM 100 MG PO CAPS
100.0000 mg | ORAL_CAPSULE | Freq: Two times a day (BID) | ORAL | Status: DC
  Administered 2012-03-19: 100 mg via ORAL
  Filled 2012-03-18 (×2): qty 1

## 2012-03-18 MED ORDER — HYPROMELLOSE (GONIOSCOPIC) 2.5 % OP SOLN
OPHTHALMIC | Status: AC
  Filled 2012-03-18: qty 15

## 2012-03-18 MED ORDER — ATENOLOL 25 MG PO TABS
25.0000 mg | ORAL_TABLET | Freq: Every evening | ORAL | Status: DC
  Administered 2012-03-18: 25 mg via ORAL
  Filled 2012-03-18 (×2): qty 1

## 2012-03-18 MED ORDER — GENTAMICIN SULFATE 40 MG/ML IJ SOLN
INTRAMUSCULAR | Status: AC
  Filled 2012-03-18: qty 2

## 2012-03-18 MED ORDER — BACITRACIN-POLYMYXIN B 500-10000 UNIT/GM OP OINT
TOPICAL_OINTMENT | OPHTHALMIC | Status: DC | PRN
  Administered 2012-03-18: 1 via OPHTHALMIC

## 2012-03-18 MED ORDER — BUPIVACAINE HCL 0.75 % IJ SOLN
INTRAMUSCULAR | Status: DC | PRN
  Administered 2012-03-18: 10 mL

## 2012-03-18 MED ORDER — EPINEPHRINE HCL 1 MG/ML IJ SOLN
INTRAOCULAR | Status: DC | PRN
  Administered 2012-03-18: 12:00:00

## 2012-03-18 MED ORDER — ACETAZOLAMIDE SODIUM 500 MG IJ SOLR
INTRAMUSCULAR | Status: AC
  Filled 2012-03-18: qty 500

## 2012-03-18 MED ORDER — FAMOTIDINE 10 MG PO TABS
10.0000 mg | ORAL_TABLET | Freq: Every day | ORAL | Status: DC
  Administered 2012-03-19: 10 mg via ORAL
  Filled 2012-03-18 (×2): qty 1

## 2012-03-18 MED ORDER — BSS PLUS IO SOLN
INTRAOCULAR | Status: AC
  Filled 2012-03-18: qty 500

## 2012-03-18 MED ORDER — MAGNESIUM HYDROXIDE 400 MG/5ML PO SUSP: 15.0000 mL | Freq: Four times a day (QID) | ORAL | Status: DC | PRN

## 2012-03-18 MED ORDER — LACTATED RINGERS IV SOLN
INTRAVENOUS | Status: DC | PRN
  Administered 2012-03-18: 12:00:00 via INTRAVENOUS

## 2012-03-18 MED ORDER — TEMAZEPAM 15 MG PO CAPS: 15.0000 mg | ORAL_CAPSULE | Freq: Every evening | ORAL | Status: DC | PRN

## 2012-03-18 MED ORDER — ATROPINE SULFATE 1 % OP SOLN
OPHTHALMIC | Status: DC | PRN
  Administered 2012-03-18: 1 [drp] via OPHTHALMIC

## 2012-03-18 MED ORDER — MINERAL OIL LIGHT 100 % EX OIL
TOPICAL_OIL | CUTANEOUS | Status: AC
  Filled 2012-03-18: qty 25

## 2012-03-18 MED ORDER — HYDROCODONE-ACETAMINOPHEN 5-325 MG PO TABS
ORAL_TABLET | ORAL | Status: AC
  Filled 2012-03-18: qty 2

## 2012-03-18 MED ORDER — NEOSTIGMINE METHYLSULFATE 1 MG/ML IJ SOLN
INTRAMUSCULAR | Status: DC | PRN
  Administered 2012-03-18: 4 mg via INTRAVENOUS

## 2012-03-18 MED ORDER — ONDANSETRON HCL 4 MG/2ML IJ SOLN
INTRAMUSCULAR | Status: DC | PRN
  Administered 2012-03-18: 4 mg via INTRAVENOUS

## 2012-03-18 MED ORDER — ROCURONIUM BROMIDE 100 MG/10ML IV SOLN
INTRAVENOUS | Status: DC | PRN
  Administered 2012-03-18: 50 mg via INTRAVENOUS

## 2012-03-18 MED ORDER — CYCLOPENTOLATE HCL 1 % OP SOLN
1.0000 [drp] | OPHTHALMIC | Status: AC | PRN
  Administered 2012-03-18 (×3): 1 [drp] via OPHTHALMIC

## 2012-03-18 MED ORDER — BSS IO SOLN
INTRAOCULAR | Status: AC
  Filled 2012-03-18: qty 15

## 2012-03-18 MED ORDER — PREDNISOLONE ACETATE 1 % OP SUSP
1.0000 [drp] | Freq: Four times a day (QID) | OPHTHALMIC | Status: DC
  Administered 2012-03-19: 1 [drp] via OPHTHALMIC
  Filled 2012-03-18: qty 1

## 2012-03-18 MED ORDER — BRIMONIDINE TARTRATE 0.2 % OP SOLN
1.0000 [drp] | Freq: Two times a day (BID) | OPHTHALMIC | Status: DC
  Administered 2012-03-19: 1 [drp] via OPHTHALMIC
  Filled 2012-03-18: qty 5

## 2012-03-18 MED ORDER — MORPHINE SULFATE 2 MG/ML IJ SOLN: 1.0000 mg | INTRAMUSCULAR | Status: DC | PRN

## 2012-03-18 MED ORDER — LIDOCAINE HCL (CARDIAC) 20 MG/ML IV SOLN
INTRAVENOUS | Status: DC | PRN
  Administered 2012-03-18: 100 mg via INTRAVENOUS

## 2012-03-18 MED ORDER — ONDANSETRON HCL 4 MG/2ML IJ SOLN: 4.0000 mg | Freq: Four times a day (QID) | INTRAMUSCULAR | Status: DC | PRN

## 2012-03-18 MED ORDER — CLONAZEPAM 1 MG PO TABS
1.0000 mg | ORAL_TABLET | Freq: Every day | ORAL | Status: DC
  Administered 2012-03-18: 1 mg via ORAL
  Filled 2012-03-18: qty 1

## 2012-03-18 MED ORDER — LIDOCAINE HCL 2 % IJ SOLN
INTRAMUSCULAR | Status: AC
  Filled 2012-03-18: qty 1

## 2012-03-18 MED ORDER — GLYCOPYRROLATE 0.2 MG/ML IJ SOLN
INTRAMUSCULAR | Status: DC | PRN
  Administered 2012-03-18: .5 mg via INTRAVENOUS

## 2012-03-18 MED ORDER — SODIUM HYALURONATE 10 MG/ML IO SOLN
INTRAOCULAR | Status: AC
  Filled 2012-03-18: qty 0.85

## 2012-03-18 MED ORDER — HYDROCODONE-ACETAMINOPHEN 5-325 MG PO TABS
1.0000 | ORAL_TABLET | ORAL | Status: DC | PRN
  Administered 2012-03-18: 1 via ORAL
  Administered 2012-03-18: 2 via ORAL
  Filled 2012-03-18: qty 2

## 2012-03-18 SURGICAL SUPPLY — 74 items
APL SRG 3 HI ABS STRL LF PLS (MISCELLANEOUS) ×2
APPLICATOR DR MATTHEWS STRL (MISCELLANEOUS) ×2 IMPLANT
BALL CTTN LRG ABS STRL LF (GAUZE/BANDAGES/DRESSINGS) ×3
BLADE EYE CATARACT 19 1.4 BEAV (BLADE) IMPLANT
BLADE MVR KNIFE 19G (BLADE) IMPLANT
BLADE MVR KNIFE 20G (BLADE) IMPLANT
CANNULA DUAL BORE 23G (CANNULA) IMPLANT
CANNULA FLEX TIP 25G (CANNULA) ×2 IMPLANT
CLOTH BEACON ORANGE TIMEOUT ST (SAFETY) ×2 IMPLANT
CORDS BIPOLAR (ELECTRODE) ×1 IMPLANT
COTTONBALL LRG STERILE PKG (GAUZE/BANDAGES/DRESSINGS) ×6 IMPLANT
DRAPE INCISE 51X51 W/FILM STRL (DRAPES) ×1 IMPLANT
DRAPE OPHTHALMIC 77X100 STRL (CUSTOM PROCEDURE TRAY) ×2 IMPLANT
FILTER BLUE MILLIPORE (MISCELLANEOUS) ×2 IMPLANT
FILTER STRAW FLUID ASPIR (MISCELLANEOUS) ×1 IMPLANT
FORCEPS ECKARDT ILM 25G SERR (OPHTHALMIC RELATED) IMPLANT
GAS OPHTHALMIC (MISCELLANEOUS) ×1 IMPLANT
GLOVE SS BIOGEL STRL SZ 6.5 (GLOVE) ×1 IMPLANT
GLOVE SS BIOGEL STRL SZ 7 (GLOVE) ×1 IMPLANT
GLOVE SUPERSENSE BIOGEL SZ 6.5 (GLOVE) ×1
GLOVE SUPERSENSE BIOGEL SZ 7 (GLOVE) ×1
GLOVE SURG 8.5 LATEX PF (GLOVE) ×2 IMPLANT
GOWN STRL NON-REIN LRG LVL3 (GOWN DISPOSABLE) ×6 IMPLANT
ILLUMINATOR CHOW PICK 25GA (MISCELLANEOUS) ×2 IMPLANT
KIT BASIN OR (CUSTOM PROCEDURE TRAY) ×2 IMPLANT
KIT ROOM TURNOVER OR (KITS) ×2 IMPLANT
KNIFE CRESCENT 1.75 EDGEAHEAD (BLADE) ×1 IMPLANT
KNIFE CRESCENT 2.5 55 ANG (BLADE) IMPLANT
LENS BIOM SUPER VIEW SET DISP (OPHTHALMIC RELATED) IMPLANT
MARKER SKIN DUAL TIP RULER LAB (MISCELLANEOUS) IMPLANT
MASK EYE SHIELD (GAUZE/BANDAGES/DRESSINGS) ×1 IMPLANT
MICROPICK 25G (MISCELLANEOUS)
NDL 18GX1X1/2 (RX/OR ONLY) (NEEDLE) ×1 IMPLANT
NDL 25GX 5/8IN NON SAFETY (NEEDLE) ×1 IMPLANT
NDL FILTER BLUNT 18X1 1/2 (NEEDLE) ×1 IMPLANT
NDL HYPO 30X.5 LL (NEEDLE) ×1 IMPLANT
NEEDLE 18GX1X1/2 (RX/OR ONLY) (NEEDLE) ×2 IMPLANT
NEEDLE 25GX 5/8IN NON SAFETY (NEEDLE) ×2 IMPLANT
NEEDLE 27GAX1X1/2 (NEEDLE) ×1 IMPLANT
NEEDLE FILTER BLUNT 18X 1/2SAF (NEEDLE) ×1
NEEDLE FILTER BLUNT 18X1 1/2 (NEEDLE) ×1 IMPLANT
NEEDLE HYPO 30X.5 LL (NEEDLE) ×2 IMPLANT
NS IRRIG 1000ML POUR BTL (IV SOLUTION) ×2 IMPLANT
PACK VITRECTOMY CUSTOM (CUSTOM PROCEDURE TRAY) ×2 IMPLANT
PAD ARMBOARD 7.5X6 YLW CONV (MISCELLANEOUS) ×4 IMPLANT
PAD EYE OVAL STERILE LF (GAUZE/BANDAGES/DRESSINGS) ×1 IMPLANT
PAK VITRECTOMY PIK 25 GA (OPHTHALMIC RELATED) ×2 IMPLANT
PENCIL BIPOLAR 25GA STR DISP (OPHTHALMIC RELATED) IMPLANT
PICK MICROPICK 25G (MISCELLANEOUS) IMPLANT
PROBE DIRECTIONAL LASER (MISCELLANEOUS) ×1 IMPLANT
REPL STRA BRUSH NDL (NEEDLE) IMPLANT
REPL STRA BRUSH NEEDLE (NEEDLE) ×2 IMPLANT
RESERVOIR BACK FLUSH (MISCELLANEOUS) ×1 IMPLANT
ROLLS DENTAL (MISCELLANEOUS) ×4 IMPLANT
SCRAPER DIAMOND DUST MEMBRANE (MISCELLANEOUS) ×1 IMPLANT
SPONGE SURGIFOAM ABS GEL 12-7 (HEMOSTASIS) ×2 IMPLANT
STOPCOCK 4 WAY LG BORE MALE ST (IV SETS) IMPLANT
SUT CHROMIC 7 0 TG140 8 (SUTURE) IMPLANT
SUT ETHILON 10 0 CS140 6 (SUTURE) IMPLANT
SUT ETHILON 9 0 TG140 8 (SUTURE) ×1 IMPLANT
SUT POLY NON ABSORB 10-0 8 STR (SUTURE) IMPLANT
SUT SILK 4 0 RB 1 (SUTURE) IMPLANT
SYR 20CC LL (SYRINGE) ×2 IMPLANT
SYR 50ML LL SCALE MARK (SYRINGE) ×1 IMPLANT
SYR 5ML LL (SYRINGE) IMPLANT
SYR BULB 3OZ (MISCELLANEOUS) ×2 IMPLANT
SYR TB 1ML LUER SLIP (SYRINGE) ×2 IMPLANT
SYRINGE 10CC LL (SYRINGE) ×1 IMPLANT
TAPE SURG TRANSPORE 1 IN (GAUZE/BANDAGES/DRESSINGS) IMPLANT
TAPE SURGICAL TRANSPORE 1 IN (GAUZE/BANDAGES/DRESSINGS) ×1
TOWEL OR 17X24 6PK STRL BLUE (TOWEL DISPOSABLE) ×6 IMPLANT
TROCAR CANNULA 25GA (CANNULA) IMPLANT
WATER STERILE IRR 1000ML POUR (IV SOLUTION) ×2 IMPLANT
WIPE INSTRUMENT VISIWIPE 73X73 (MISCELLANEOUS) ×2 IMPLANT

## 2012-03-18 NOTE — Anesthesia Postprocedure Evaluation (Signed)
  Anesthesia Post-op Note  Patient: Monica Faulkner  Procedure(s) Performed: Procedure(s) (LRB): PARS PLANA VITRECTOMY WITH 25 GAUGE (Left) DIODE LASER APPLICATION (Left) MEMBRANE PEEL (Left) SERUM PATCH (Left) INSERTION OF GAS (Left)  Patient Location: PACU  Anesthesia Type: General  Level of Consciousness: awake, alert  and oriented  Airway and Oxygen Therapy: Patient Spontanous Breathing  Post-op Pain: none  Post-op Assessment: Post-op Vital signs reviewed and Patient's Cardiovascular Status Stable  Post-op Vital Signs: Reviewed and stable  Complications: No apparent anesthesia complications

## 2012-03-18 NOTE — H&P (Signed)
I examined the patient today and there is no change in the medical status 

## 2012-03-18 NOTE — Transfer of Care (Signed)
Immediate Anesthesia Transfer of Care Note  Patient: Shakeira Kayde Atkerson  Procedure(s) Performed: Procedure(s) (LRB): PARS PLANA VITRECTOMY WITH 25 GAUGE (Left) DIODE LASER APPLICATION (Left) MEMBRANE PEEL (Left) SERUM PATCH (Left) INSERTION OF GAS (Left)  Patient Location: PACU  Anesthesia Type: General  Level of Consciousness: awake, alert  and oriented  Airway & Oxygen Therapy: Patient Spontanous Breathing and Patient connected to face mask oxygen  Post-op Assessment: Report given to PACU RN and Post -op Vital signs reviewed and stable  Post vital signs: Reviewed and stable  Complications: No apparent anesthesia complications

## 2012-03-18 NOTE — Preoperative (Signed)
Beta Blockers   Reason not to administer Beta Blockers:Not Applicable 

## 2012-03-18 NOTE — Anesthesia Preprocedure Evaluation (Addendum)
Anesthesia Evaluation  Patient identified by MRN, date of birth, ID band Patient awake    Reviewed: Allergy & Precautions, H&P , NPO status , Patient's Chart, lab work & pertinent test results, reviewed documented beta blocker date and time   Airway Mallampati: I TM Distance: >3 FB Neck ROM: Full    Dental  (+) Teeth Intact and Dental Advisory Given   Pulmonary          Cardiovascular hypertension, Pt. on medications and Pt. on home beta blockers     Neuro/Psych Anxiety Depression    GI/Hepatic GERD-  Medicated,  Endo/Other  Type 2  Renal/GU      Musculoskeletal   Abdominal   Peds  Hematology   Anesthesia Other Findings   Reproductive/Obstetrics                          Anesthesia Physical Anesthesia Plan  ASA: III  Anesthesia Plan: General   Post-op Pain Management:    Induction: Intravenous  Airway Management Planned: Oral ETT  Additional Equipment:   Intra-op Plan:   Post-operative Plan: Extubation in OR  Informed Consent: I have reviewed the patients History and Physical, chart, labs and discussed the procedure including the risks, benefits and alternatives for the proposed anesthesia with the patient or authorized representative who has indicated his/her understanding and acceptance.   Dental advisory given  Plan Discussed with: CRNA and Surgeon  Anesthesia Plan Comments:        Anesthesia Quick Evaluation

## 2012-03-18 NOTE — Brief Op Note (Signed)
03/18/2012  1:25 PM  PATIENT:  [name]  71 y.o. female  PRE-OPERATIVE DIAGNOSIS:  Left Eye Macular Hole  POST-OPERATIVE DIAGNOSIS:  Left Eye Macular Hole  PROCEDURE:  Procedure(s) (LRB): PARS PLANA VITRECTOMY WITH 25 GAUGE (Left) DIODE LASER APPLICATION (Left) MEMBRANE PEEL (Left) SERUM PATCH (Left) INSERTION OF GAS (Left)  SURGEON:  Surgeon(s) and Role:    * Sherrie George, MD - Primary    Preoperative diagnosis:  Pre-Op Diagnosis Codes:    * Macular cyst, hole, or pseudohole of retina [362.54] Postoperative diagnosis  Post-Op Diagnosis Codes:    * Macular cyst, hole, or pseudohole of retina [362.54]  Procedures: @ORPROCAL @  Surgeon:  Sherrie George, MD...  Assistant:  Rosalie Doctor SA   Anesthesia: General  Specimen: none  Estimated blood loss:  1cc  Complications: none  Patient sent to PACU in good condition  Composed by Sherrie George MD  Dictation number: [phone]

## 2012-03-19 ENCOUNTER — Encounter (HOSPITAL_COMMUNITY): Payer: Self-pay | Admitting: Ophthalmology

## 2012-03-19 MED ORDER — PREDNISOLONE ACETATE 1 % OP SUSP: 1.0000 [drp] | Freq: Four times a day (QID) | OPHTHALMIC | Status: AC

## 2012-03-19 MED ORDER — GATIFLOXACIN 0.5 % OP SOLN: 1.0000 [drp] | Freq: Four times a day (QID) | OPHTHALMIC | Status: DC

## 2012-03-19 MED ORDER — BRIMONIDINE TARTRATE 0.2 % OP SOLN: 1.0000 [drp] | Freq: Two times a day (BID) | OPHTHALMIC | Status: AC

## 2012-03-19 MED ORDER — BACITRACIN-POLYMYXIN B 500-10000 UNIT/GM OP OINT: 1.0000 "application " | TOPICAL_OINTMENT | Freq: Four times a day (QID) | OPHTHALMIC | Status: AC

## 2012-03-19 MED FILL — Cefazolin Sodium for IV Soln 2 GM and Dextrose 3% (50 ML): INTRAVENOUS | Qty: 50 | Status: AC

## 2012-03-19 NOTE — Progress Notes (Signed)
03/19/2012, 6:18 AM  Mental Status:  Awake, Alert, Oriented  Anterior segment: Cornea  Clear    Anterior Chamber Clear    Lens:    IOL  Intra Ocular Pressure 20 mmHg with Tonopen  Vitreous: Clear 95%gas bubble   Retina:  Attached Good laser reaction   Impression: Excellent result Retina attached   Final Diagnosis: Principal Problem:  *Macular hole   Plan: start post operative eye drops.  Discharge to home.  Give post operative instructions  Sherrie George 03/19/2012, 6:18 AM

## 2012-03-19 NOTE — Op Note (Signed)
NAMELOY, LITTLE NO.:  [id number]  MEDICAL RECORD NO.:  [id number]  LOCATION:  [facility]                         FACILITY:  [facility]  PHYSICIAN:  Kasean Denherder D. Ashley Royalty, M.D. DATE OF BIRTH:  1941/01/05  DATE OF PROCEDURE:  03/18/2012 DATE OF DISCHARGE:                              OPERATIVE REPORT   ADMISSION DIAGNOSIS:  Macular hole, stage IV, left eye.  PROCEDURES:  Pars plana vitrectomy, repair of macular hole, membrane peel, retinal photocoagulation, gas-fluid exchange, serum patch, all in the left eye.  SURGEON:  Beulah Gandy. Ashley Royalty, MD.  ASSISTANT:  Rosalie Doctor, SA.  ANESTHESIA:  General.  DETAILS:  Usual prep and drape, 25-gauge trocars placed at 10 o'clock and 4 o'clock.  A MVR incision, 20-gauge, performed at 2 o'clock. Contact lens ring anchored into place.  Provisc placed on the corneal surface and the flat contact lens was placed.  Pars plana vitrectomy was begun just behind the cataractous lens.  PSC cataract caused dim view of the pathology.  The vitrectomy was carried posteriorly down to the macular surface where a stage IV macular hole with surrounding pigmentary degeneration was seen.  Additional vitreous was removed from the mid-vitreous in a core fashion.  The vitrectomy was carried into the mid-vitreous space and vitreous was removed from this region.  A silicone-tip suction line was drowned down to the macular surface looking for the fish-strike sign; however, no fish strike sign occurred. The magnifying contact lens was then placed on the cornea and the diamond-dusted membrane scraper was used to engage the posterior hyaloid and the internal limiting membrane of the retinal surface.  The membrane was peeled from around the edges of the hole for 360 degrees and out approximately 2 disc diameters from the hole.  Once this was accomplished, the vitrectomy was carried into the far periphery with a 30-degree prismatic lens.  The lens was rotated to  obtain a view for 360 degrees and all vitreous was removed, and a gas-fluid exchange was carried out at this point.  Sufficient time was allowed for additional fluid to track down the walls of the eye and collect in the posterior segment. The serum patch was prepared during this time and C3F8 in a 15% concentration was prepared. Additional fluid was removed from the posterior segment.  Serum patch was delivered. C3F8 of 15% was exchanged for intravitreal gas.  The indirect ophthalmoscope laser had been moved into place and 677 burns were placed around the retinal periphery around weak areas of the retina.  The power was 400 mW, 1000 microns each and 0.1 seconds each.  The gas-gas exchange was performed with C3F8 being exchanged for intravitreal gas.  The instruments were removed from the eye and the scleral wound was closed with 9-0 nylon suture.  Conjunctiva was closed with wet-field cautery.  Polymyxin and gentamicin were irrigated into tenon space.  Atropine solution was applied.  Marcaine was injected around the globe for postop pain.  Decadron 10 mg was injected to the lower subconjunctival space.  Polysporin ophthalmic ointment, a patch and shield were placed.  The patient was awakened and taken to recovery room in satisfactory condition.  DURATION:  1 hour 15 minutes.  COMPLICATIONS:  None.     Beulah Gandy. Ashley Royalty, M.D.     JDM/MEDQ  D:  03/18/2012  T:  03/19/2012  Job:  [id number]

## 2012-03-19 NOTE — Discharge Summary (Signed)
Discharge summary not needed on [facility] patients per medical records. 

## 2012-03-19 NOTE — Progress Notes (Signed)
Patient d/c home with family Left floor via wheelchair accompanied by staff No c/o pain at d/c  Verbalized understanding of d/c instructions, when to follow up with MD, and RX's Patient provided with eye medications and understands how and when to use the medications. Naveyah Iacovelli [employer]

## 2012-03-25 ENCOUNTER — Ambulatory Visit (INDEPENDENT_AMBULATORY_CARE_PROVIDER_SITE_OTHER): Payer: Medicare Other | Admitting: Ophthalmology

## 2012-03-25 DIAGNOSIS — H35349 Macular cyst, hole, or pseudohole, unspecified eye: Secondary | ICD-10-CM

## 2012-04-14 ENCOUNTER — Encounter (INDEPENDENT_AMBULATORY_CARE_PROVIDER_SITE_OTHER): Payer: Medicare Other | Admitting: Ophthalmology

## 2012-04-22 ENCOUNTER — Encounter (INDEPENDENT_AMBULATORY_CARE_PROVIDER_SITE_OTHER): Payer: Medicare Other | Admitting: Ophthalmology

## 2012-05-03 ENCOUNTER — Encounter (INDEPENDENT_AMBULATORY_CARE_PROVIDER_SITE_OTHER): Payer: Medicare Other | Admitting: Ophthalmology

## 2012-05-06 ENCOUNTER — Encounter (INDEPENDENT_AMBULATORY_CARE_PROVIDER_SITE_OTHER): Payer: Medicare Other | Admitting: Ophthalmology

## 2012-05-06 DIAGNOSIS — H35349 Macular cyst, hole, or pseudohole, unspecified eye: Secondary | ICD-10-CM

## 2012-07-19 ENCOUNTER — Encounter (INDEPENDENT_AMBULATORY_CARE_PROVIDER_SITE_OTHER): Payer: Medicare Other | Admitting: Ophthalmology

## 2012-07-28 ENCOUNTER — Encounter (INDEPENDENT_AMBULATORY_CARE_PROVIDER_SITE_OTHER): Payer: Medicare Other | Admitting: Ophthalmology

## 2012-08-04 ENCOUNTER — Encounter (INDEPENDENT_AMBULATORY_CARE_PROVIDER_SITE_OTHER): Payer: Medicare Other | Admitting: Ophthalmology

## 2012-09-13 ENCOUNTER — Encounter (INDEPENDENT_AMBULATORY_CARE_PROVIDER_SITE_OTHER): Payer: Medicare Other | Admitting: Ophthalmology

## 2012-09-27 ENCOUNTER — Encounter (INDEPENDENT_AMBULATORY_CARE_PROVIDER_SITE_OTHER): Payer: Medicare Other | Admitting: Ophthalmology

## 2012-10-25 ENCOUNTER — Encounter (INDEPENDENT_AMBULATORY_CARE_PROVIDER_SITE_OTHER): Payer: Medicare Other | Admitting: Ophthalmology

## 2012-11-04 ENCOUNTER — Encounter (INDEPENDENT_AMBULATORY_CARE_PROVIDER_SITE_OTHER): Payer: PRIVATE HEALTH INSURANCE | Admitting: Ophthalmology

## 2012-11-04 DIAGNOSIS — H35039 Hypertensive retinopathy, unspecified eye: Secondary | ICD-10-CM

## 2012-11-04 DIAGNOSIS — H43819 Vitreous degeneration, unspecified eye: Secondary | ICD-10-CM

## 2012-11-04 DIAGNOSIS — I10 Essential (primary) hypertension: Secondary | ICD-10-CM

## 2013-03-07 ENCOUNTER — Ambulatory Visit (INDEPENDENT_AMBULATORY_CARE_PROVIDER_SITE_OTHER): Payer: PRIVATE HEALTH INSURANCE | Admitting: Ophthalmology

## 2013-04-04 ENCOUNTER — Ambulatory Visit (INDEPENDENT_AMBULATORY_CARE_PROVIDER_SITE_OTHER): Payer: PRIVATE HEALTH INSURANCE | Admitting: Ophthalmology

## 2013-04-14 ENCOUNTER — Ambulatory Visit (INDEPENDENT_AMBULATORY_CARE_PROVIDER_SITE_OTHER): Payer: PRIVATE HEALTH INSURANCE | Admitting: Ophthalmology

## 2013-05-09 ENCOUNTER — Ambulatory Visit (INDEPENDENT_AMBULATORY_CARE_PROVIDER_SITE_OTHER): Payer: PRIVATE HEALTH INSURANCE | Admitting: Ophthalmology

## 2013-05-25 ENCOUNTER — Ambulatory Visit (INDEPENDENT_AMBULATORY_CARE_PROVIDER_SITE_OTHER): Payer: PRIVATE HEALTH INSURANCE | Admitting: Ophthalmology

## 2013-05-25 DIAGNOSIS — H35349 Macular cyst, hole, or pseudohole, unspecified eye: Secondary | ICD-10-CM

## 2013-05-25 DIAGNOSIS — H2510 Age-related nuclear cataract, unspecified eye: Secondary | ICD-10-CM

## 2013-05-25 DIAGNOSIS — I10 Essential (primary) hypertension: Secondary | ICD-10-CM

## 2013-05-25 DIAGNOSIS — H3530 Unspecified macular degeneration: Secondary | ICD-10-CM

## 2013-05-25 DIAGNOSIS — H35039 Hypertensive retinopathy, unspecified eye: Secondary | ICD-10-CM

## 2013-05-25 DIAGNOSIS — H43819 Vitreous degeneration, unspecified eye: Secondary | ICD-10-CM

## 2013-06-02 ENCOUNTER — Other Ambulatory Visit: Payer: Self-pay

## 2013-06-02 DIAGNOSIS — Z1231 Encounter for screening mammogram for malignant neoplasm of breast: Secondary | ICD-10-CM

## 2013-06-20 ENCOUNTER — Ambulatory Visit: Payer: Medicare Other

## 2013-07-13 ENCOUNTER — Ambulatory Visit: Payer: Medicare Other

## 2013-08-19 ENCOUNTER — Ambulatory Visit: Payer: Medicare Other

## 2013-08-24 ENCOUNTER — Ambulatory Visit: Payer: Medicare Other

## 2013-09-09 ENCOUNTER — Ambulatory Visit: Payer: Medicare Other

## 2013-12-01 ENCOUNTER — Ambulatory Visit (INDEPENDENT_AMBULATORY_CARE_PROVIDER_SITE_OTHER): Payer: PRIVATE HEALTH INSURANCE | Admitting: Ophthalmology

## 2013-12-11 ENCOUNTER — Emergency Department (HOSPITAL_COMMUNITY)
Admission: EM | Admit: 2013-12-11 | Discharge: 2013-12-11 | Disposition: A | Discharge status: Home / Self Care | Payer: Medicare Other | Attending: Emergency Medicine | Admitting: Emergency Medicine

## 2013-12-11 ENCOUNTER — Encounter (HOSPITAL_COMMUNITY): Payer: Self-pay | Admitting: Emergency Medicine

## 2013-12-11 DIAGNOSIS — F329 Major depressive disorder, single episode, unspecified: Secondary | ICD-10-CM | POA: Insufficient documentation

## 2013-12-11 DIAGNOSIS — F3289 Other specified depressive episodes: Secondary | ICD-10-CM | POA: Insufficient documentation

## 2013-12-11 DIAGNOSIS — R110 Nausea: Secondary | ICD-10-CM | POA: Insufficient documentation

## 2013-12-11 DIAGNOSIS — R197 Diarrhea, unspecified: Secondary | ICD-10-CM | POA: Insufficient documentation

## 2013-12-11 DIAGNOSIS — K219 Gastro-esophageal reflux disease without esophagitis: Secondary | ICD-10-CM | POA: Insufficient documentation

## 2013-12-11 DIAGNOSIS — R109 Unspecified abdominal pain: Secondary | ICD-10-CM | POA: Insufficient documentation

## 2013-12-11 DIAGNOSIS — F411 Generalized anxiety disorder: Secondary | ICD-10-CM | POA: Insufficient documentation

## 2013-12-11 DIAGNOSIS — Z85828 Personal history of other malignant neoplasm of skin: Secondary | ICD-10-CM | POA: Insufficient documentation

## 2013-12-11 DIAGNOSIS — E119 Type 2 diabetes mellitus without complications: Secondary | ICD-10-CM | POA: Insufficient documentation

## 2013-12-11 DIAGNOSIS — Z79899 Other long term (current) drug therapy: Secondary | ICD-10-CM | POA: Insufficient documentation

## 2013-12-11 DIAGNOSIS — I10 Essential (primary) hypertension: Secondary | ICD-10-CM | POA: Insufficient documentation

## 2013-12-11 LAB — CBC WITH DIFFERENTIAL/PLATELET
Basophils Absolute: 0 10*3/uL (ref 0.0–0.1)
Basophils Relative: 0 % (ref 0–1)
Eosinophils Absolute: 0.1 10*3/uL (ref 0.0–0.7)
Eosinophils Relative: 2 % (ref 0–5)
HCT: 39 % (ref 36.0–46.0)
Hemoglobin: 13.8 g/dL (ref 12.0–15.0)
Lymphocytes Relative: 23 % (ref 12–46)
Lymphs Abs: 1.2 10*3/uL (ref 0.7–4.0)
MCH: 31.9 pg (ref 26.0–34.0)
MCHC: 35.4 g/dL (ref 30.0–36.0)
MCV: 90.1 fL (ref 78.0–100.0)
Monocytes Absolute: 0.6 10*3/uL (ref 0.1–1.0)
Monocytes Relative: 11 % (ref 3–12)
Neutro Abs: 3.4 10*3/uL (ref 1.7–7.7)
Neutrophils Relative %: 64 % (ref 43–77)
Platelets: 215 10*3/uL (ref 150–400)
RBC: 4.33 MIL/uL (ref 3.87–5.11)
RDW: 11.9 % (ref 11.5–15.5)
WBC: 5.2 10*3/uL (ref 4.0–10.5)

## 2013-12-11 LAB — URINALYSIS, ROUTINE W REFLEX MICROSCOPIC
Bilirubin Urine: NEGATIVE
Glucose, UA: NEGATIVE mg/dL
Ketones, ur: NEGATIVE mg/dL
Leukocytes, UA: NEGATIVE
Nitrite: NEGATIVE
Protein, ur: NEGATIVE mg/dL
Specific Gravity, Urine: 1.004 — ABNORMAL LOW (ref 1.005–1.030)
Urobilinogen, UA: 0.2 mg/dL (ref 0.0–1.0)
pH: 6.5 (ref 5.0–8.0)

## 2013-12-11 LAB — COMPREHENSIVE METABOLIC PANEL
ALT: 12 U/L (ref 0–35)
AST: 16 U/L (ref 0–37)
Albumin: 3.6 g/dL (ref 3.5–5.2)
Alkaline Phosphatase: 55 U/L (ref 39–117)
BUN: 10 mg/dL (ref 6–23)
CO2: 24 mEq/L (ref 19–32)
Calcium: 9.1 mg/dL (ref 8.4–10.5)
Chloride: 102 mEq/L (ref 96–112)
Creatinine, Ser: 0.67 mg/dL (ref 0.50–1.10)
GFR calc Af Amer: >90 mL/min (ref >90)
GFR calc non Af Amer: 86 mL/min — ABNORMAL LOW (ref >90)
Glucose, Bld: 138 mg/dL — ABNORMAL HIGH (ref 70–99)
Potassium: 3.7 mEq/L (ref 3.7–5.3)
Sodium: 139 mEq/L (ref 137–147)
Total Bilirubin: 0.6 mg/dL (ref 0.3–1.2)
Total Protein: 6.5 g/dL (ref 6.0–8.3)

## 2013-12-11 LAB — URINE MICROSCOPIC-ADD ON

## 2013-12-11 LAB — LIPASE, BLOOD: Lipase: 80 U/L — ABNORMAL HIGH (ref 11–59)

## 2013-12-11 MED ORDER — SODIUM CHLORIDE 0.9 % IV BOLUS (SEPSIS)
1000.0000 mL | INTRAVENOUS | Status: AC
  Administered 2013-12-11: 1000 mL via INTRAVENOUS

## 2013-12-11 MED ORDER — ONDANSETRON HCL 4 MG/2ML IJ SOLN
4.0000 mg | Freq: Once | INTRAMUSCULAR | Status: AC
  Administered 2013-12-11: 4 mg via INTRAVENOUS
  Filled 2013-12-11: qty 2

## 2013-12-11 MED ORDER — ONDANSETRON HCL 4 MG PO TABS: 4.0000 mg | ORAL_TABLET | Freq: Four times a day (QID) | ORAL | Status: DC

## 2013-12-11 NOTE — ED Provider Notes (Signed)
Medical screening examination/treatment/procedure(s) were conducted as a shared visit with non-physician practitioner(s) and myself.  I personally evaluated the patient during the encounter.   EKG Interpretation None       Orlie Dakin, MD 12/11/13 2257

## 2013-12-11 NOTE — ED Notes (Signed)
Pt reports having diarrhea since Thursday. Pt denies pain or seeing any blood in her stool. Pt reports attempting imodium without relief. Pt reports nausea, however denies emesis as well as recently being on any antibiotics. Pt is hypertensive in triage, however reports a history of hypertension.

## 2013-12-11 NOTE — ED Notes (Signed)
Pt given water to drink. 

## 2013-12-11 NOTE — ED Provider Notes (Signed)
Complains of diarrhea, watery onset 4 days ago. Symptoms accompanied by general weakness. She complains of mild gassy diffuse abdominal pain when she eats. No pain now. No fevers. No recent antibiotics or travel. On exam no distress Glasgow Coma Score 15 mucous membranes dry lungs clear auscultation heart regular rate and rhythm abdomen obese normal active bowel sounds nontender  Orlie Dakin, MD 12/11/13 1533

## 2013-12-11 NOTE — ED Provider Notes (Signed)
CSN: [id number]     Arrival date & time 12/11/13  1430 History   First MD Initiated Contact with Patient 12/11/13 1502     Chief Complaint  Patient presents with  . Diarrhea     (Consider location/radiation/quality/duration/timing/severity/associated sxs/prior Treatment) HPI Pt is a 73yo female presenting to ED from home with hx of HTN, DM managed by diet, and GERD c/o watery diarrhea that started 4 days ago.  Reports 6-10 episodes of watery diarrhea since sudden onset 4 days ago. Denies blood or mucous in stool. Reports mild generalized abdominal pain that is "gassy" in nature, worse after eating but resolved after having an episode of diarrhea. Reports mild nausea but denies vomiting or fever. Has tried imodium, up to 4 tabs daily w/o relief. Pt states she normally notices difference in diarrhea with just one tab of imodium.  Denies sick contacts, recent travel, or recent antibiotic use. Abdominal surgery hx significant for appendectomy and hysterectomy.   Past Medical History  Diagnosis Date  . Complication of anesthesia     atropine increased BP & increased pulse  . Anxiety     seen at [facility], for extreme anxiety. As a result of her anxiety  she had ECHO  . Hypertension   . Depression   . Diabetes mellitus     type 2- 2006, managed with diet  . GERD (gastroesophageal reflux disease)     rare occurence  . Headache(784.0)     caused by sinus congestion fr. time to time   . Cancer     basal cell   . H/O hiatal hernia    Past Surgical History  Procedure Laterality Date  . Tear duct probing      as an infant  . Tonsillectomy      and adenioidectomy- as a child  . Hernia repair      1974- inguinal repair  . Appendectomy      L ovarian cyst - ruptured & appendectomy done at same time.   . Abdominal hysterectomy      1991-scar later had to be further repaired   . Eye surgery      R eye- macular hole repair- 2008  . Vitrectomy  03/18/2012  . Pars plana vitrectomy  03/18/2012     Procedure: PARS PLANA VITRECTOMY WITH 25 GAUGE;  Surgeon: Hayden Pedro, MD;  Location: [place];  Service: Ophthalmology;  Laterality: Left;  25 GAUGE PPV FOR MACULAR HOLE  . Gas insertion  03/18/2012    Procedure: INSERTION OF GAS;  Surgeon: Hayden Pedro, MD;  Location: [place];  Service: Ophthalmology;  Laterality: Left;   No family history on file. History  Substance Use Topics  . Smoking status: Never Smoker   . Smokeless tobacco: Never Used  . Alcohol Use: No   OB History   Grav Para Term Preterm Abortions TAB SAB Ect Mult Living                 Review of Systems  Constitutional: Negative for fever, chills, diaphoresis, appetite change and fatigue.  Respiratory: Negative for shortness of breath.   Cardiovascular: Negative for chest pain and palpitations.  Gastrointestinal: Positive for nausea, abdominal pain and diarrhea. Negative for vomiting.  Genitourinary: Negative for dysuria, frequency, flank pain, decreased urine volume, vaginal discharge, vaginal pain and pelvic pain.  Musculoskeletal: Negative for back pain.  Neurological: Negative for dizziness, weakness, light-headedness, numbness and headaches.  All other systems reviewed and are negative.  Allergies  Avapro; Decongestant; Norvasc; and Sulfa antibiotics  Home Medications   Prior to Admission medications   Medication Sig Start Date End Date Taking? Authorizing Provider  atenolol (TENORMIN) 25 MG tablet Take 25 mg by mouth every evening.   Yes Historical Provider, MD  clonazePAM (KLONOPIN) 1 MG tablet Take 0.25-1 mg by mouth at bedtime.    Yes Historical Provider, MD  diphenhydrAMINE (BENADRYL) 25 mg capsule Take 25 mg by mouth at bedtime.   Yes Historical Provider, MD  escitalopram (LEXAPRO) 10 MG tablet Take 10 mg by mouth every morning.   Yes Historical Provider, MD  ibuprofen (ADVIL,MOTRIN) 200 MG tablet Take 200 mg by mouth daily as needed. for pain   Yes Historical Provider, MD  lisinopril  (PRINIVIL,ZESTRIL) 40 MG tablet Take 40 mg by mouth 2 (two) times daily.   Yes Historical Provider, MD  ranitidine (ZANTAC) 150 MG tablet Take 150 mg by mouth daily as needed for heartburn.    Yes Historical Provider, MD  temazepam (RESTORIL) 15 MG capsule Take 15 mg by mouth at bedtime.   Yes Historical Provider, MD   BP 163/57  Pulse 58  Temp(Src) 98.8 F (37.1 C) (Oral)  Resp 18  SpO2 93% Physical Exam  Nursing note and vitals reviewed. Constitutional: She appears well-developed and well-nourished. No distress.  Pt lying comfortably in exam bed, NAD.   HENT:  Head: Normocephalic and atraumatic.  Eyes: Conjunctivae are normal. No scleral icterus.  Neck: Normal range of motion. Neck supple.  Cardiovascular: Normal rate, regular rhythm and normal heart sounds.   Pulmonary/Chest: Effort normal and breath sounds normal. No respiratory distress. She has no wheezes. She has no rales. She exhibits no tenderness.  No respiratory distress, able to speak in full sentences w/o difficulty. Lungs: CTAB  Abdominal: Soft. Bowel sounds are normal. She exhibits no distension and no mass. There is no tenderness. There is no rebound and no guarding.  Soft, non-distended, non-tender. No CVAT  Musculoskeletal: Normal range of motion.  Neurological: She is alert.  Skin: Skin is warm and dry. She is not diaphoretic.    ED Course  Procedures (including critical care time) Labs Review Labs Reviewed  URINALYSIS, ROUTINE W REFLEX MICROSCOPIC - Abnormal; Notable for the following:    Specific Gravity, Urine 1.004 (*)    Hgb urine dipstick TRACE (*)    All other components within normal limits  COMPREHENSIVE METABOLIC PANEL - Abnormal; Notable for the following:    Glucose, Bld 138 (*)    GFR calc non Af Amer 86 (*)    All other components within normal limits  LIPASE, BLOOD - Abnormal; Notable for the following:    Lipase 80 (*)    All other components within normal limits  CBC WITH DIFFERENTIAL   URINE MICROSCOPIC-ADD ON    Imaging Review No results found.   EKG Interpretation None      MDM   Final diagnoses:  Diarrhea  Nausea    Pt is a 73yo female presenting c/o watery diarrhea x4 days w/o blood or mucous in stool. Low concern for C. Diff as pt lives at home, no sick contacts, no recent travel, no recent antibiotic use.  Labs: CBC, CMP, Lipase, UA: unremarkable.   1L IV fluids with zofran given in ED. Pt states she feels improved.  She was able to keep down several ounces of water in ED. Not concerned for emergent process taking place at this time. Advised to f/u with PCP next week  for recheck of symptoms if not improving. Return precautions provided. Pt verbalized understanding and agreement with tx plan.  Discussed pt with Dr. Winfred Leeds who also examined pt and agrees with tx plan.      Noland Fordyce, PA-C 12/11/13 1801

## 2013-12-15 ENCOUNTER — Ambulatory Visit (INDEPENDENT_AMBULATORY_CARE_PROVIDER_SITE_OTHER): Payer: PRIVATE HEALTH INSURANCE | Admitting: Ophthalmology

## 2013-12-22 ENCOUNTER — Ambulatory Visit (INDEPENDENT_AMBULATORY_CARE_PROVIDER_SITE_OTHER): Payer: PRIVATE HEALTH INSURANCE | Admitting: Ophthalmology

## 2014-01-04 ENCOUNTER — Ambulatory Visit (INDEPENDENT_AMBULATORY_CARE_PROVIDER_SITE_OTHER): Payer: PRIVATE HEALTH INSURANCE | Admitting: Ophthalmology

## 2014-01-18 ENCOUNTER — Ambulatory Visit (INDEPENDENT_AMBULATORY_CARE_PROVIDER_SITE_OTHER): Payer: PRIVATE HEALTH INSURANCE | Admitting: Ophthalmology

## 2014-01-18 DIAGNOSIS — E1165 Type 2 diabetes mellitus with hyperglycemia: Secondary | ICD-10-CM

## 2014-01-18 DIAGNOSIS — H35039 Hypertensive retinopathy, unspecified eye: Secondary | ICD-10-CM

## 2014-01-18 DIAGNOSIS — I10 Essential (primary) hypertension: Secondary | ICD-10-CM

## 2014-01-18 DIAGNOSIS — H35349 Macular cyst, hole, or pseudohole, unspecified eye: Secondary | ICD-10-CM

## 2014-01-18 DIAGNOSIS — H3530 Unspecified macular degeneration: Secondary | ICD-10-CM

## 2014-01-18 DIAGNOSIS — E1139 Type 2 diabetes mellitus with other diabetic ophthalmic complication: Secondary | ICD-10-CM

## 2014-01-18 DIAGNOSIS — E11319 Type 2 diabetes mellitus with unspecified diabetic retinopathy without macular edema: Secondary | ICD-10-CM

## 2015-01-29 ENCOUNTER — Ambulatory Visit (INDEPENDENT_AMBULATORY_CARE_PROVIDER_SITE_OTHER): Payer: Medicare Other | Admitting: Ophthalmology

## 2015-01-29 DIAGNOSIS — H35343 Macular cyst, hole, or pseudohole, bilateral: Secondary | ICD-10-CM | POA: Diagnosis not present

## 2015-01-29 DIAGNOSIS — I10 Essential (primary) hypertension: Secondary | ICD-10-CM

## 2015-01-29 DIAGNOSIS — E11319 Type 2 diabetes mellitus with unspecified diabetic retinopathy without macular edema: Secondary | ICD-10-CM

## 2015-01-29 DIAGNOSIS — H35033 Hypertensive retinopathy, bilateral: Secondary | ICD-10-CM

## 2015-01-29 DIAGNOSIS — E11329 Type 2 diabetes mellitus with mild nonproliferative diabetic retinopathy without macular edema: Secondary | ICD-10-CM | POA: Diagnosis not present

## 2016-02-11 ENCOUNTER — Ambulatory Visit (INDEPENDENT_AMBULATORY_CARE_PROVIDER_SITE_OTHER): Payer: Medicare Other | Admitting: Ophthalmology

## 2016-02-11 DIAGNOSIS — H35343 Macular cyst, hole, or pseudohole, bilateral: Secondary | ICD-10-CM

## 2016-02-11 DIAGNOSIS — E113293 Type 2 diabetes mellitus with mild nonproliferative diabetic retinopathy without macular edema, bilateral: Secondary | ICD-10-CM

## 2016-02-11 DIAGNOSIS — I10 Essential (primary) hypertension: Secondary | ICD-10-CM

## 2016-02-11 DIAGNOSIS — H35033 Hypertensive retinopathy, bilateral: Secondary | ICD-10-CM

## 2016-02-11 DIAGNOSIS — E11319 Type 2 diabetes mellitus with unspecified diabetic retinopathy without macular edema: Secondary | ICD-10-CM | POA: Diagnosis not present

## 2016-02-11 DIAGNOSIS — H26492 Other secondary cataract, left eye: Secondary | ICD-10-CM

## 2016-02-28 ENCOUNTER — Ambulatory Visit (INDEPENDENT_AMBULATORY_CARE_PROVIDER_SITE_OTHER): Payer: Medicare Other | Admitting: Ophthalmology

## 2016-02-28 DIAGNOSIS — H2702 Aphakia, left eye: Secondary | ICD-10-CM

## 2017-01-29 ENCOUNTER — Ambulatory Visit (INDEPENDENT_AMBULATORY_CARE_PROVIDER_SITE_OTHER): Payer: Medicare Other | Admitting: Ophthalmology

## 2017-01-29 DIAGNOSIS — E113293 Type 2 diabetes mellitus with mild nonproliferative diabetic retinopathy without macular edema, bilateral: Secondary | ICD-10-CM | POA: Diagnosis not present

## 2017-01-29 DIAGNOSIS — I10 Essential (primary) hypertension: Secondary | ICD-10-CM

## 2017-01-29 DIAGNOSIS — H353112 Nonexudative age-related macular degeneration, right eye, intermediate dry stage: Secondary | ICD-10-CM

## 2017-01-29 DIAGNOSIS — H35343 Macular cyst, hole, or pseudohole, bilateral: Secondary | ICD-10-CM

## 2017-01-29 DIAGNOSIS — H35033 Hypertensive retinopathy, bilateral: Secondary | ICD-10-CM

## 2017-02-11 ENCOUNTER — Ambulatory Visit (INDEPENDENT_AMBULATORY_CARE_PROVIDER_SITE_OTHER): Payer: Medicare Other | Admitting: Ophthalmology

## 2018-02-10 ENCOUNTER — Ambulatory Visit (INDEPENDENT_AMBULATORY_CARE_PROVIDER_SITE_OTHER): Payer: Medicare Other | Admitting: Ophthalmology

## 2018-02-10 DIAGNOSIS — H353122 Nonexudative age-related macular degeneration, left eye, intermediate dry stage: Secondary | ICD-10-CM

## 2018-02-10 DIAGNOSIS — H35343 Macular cyst, hole, or pseudohole, bilateral: Secondary | ICD-10-CM | POA: Diagnosis not present

## 2018-02-10 DIAGNOSIS — I10 Essential (primary) hypertension: Secondary | ICD-10-CM

## 2018-02-10 DIAGNOSIS — H35033 Hypertensive retinopathy, bilateral: Secondary | ICD-10-CM

## 2018-02-10 DIAGNOSIS — E11319 Type 2 diabetes mellitus with unspecified diabetic retinopathy without macular edema: Secondary | ICD-10-CM | POA: Diagnosis not present

## 2018-02-10 DIAGNOSIS — E113293 Type 2 diabetes mellitus with mild nonproliferative diabetic retinopathy without macular edema, bilateral: Secondary | ICD-10-CM

## 2018-04-28 ENCOUNTER — Emergency Department (HOSPITAL_COMMUNITY)
Admission: EM | Admit: 2018-04-28 | Discharge: 2018-04-28 | Disposition: A | Discharge status: Home / Self Care | Payer: Medicare Other | Attending: Emergency Medicine | Admitting: Emergency Medicine

## 2018-04-28 ENCOUNTER — Encounter (HOSPITAL_COMMUNITY): Payer: Self-pay | Admitting: Family Medicine

## 2018-04-28 DIAGNOSIS — R112 Nausea with vomiting, unspecified: Secondary | ICD-10-CM | POA: Diagnosis not present

## 2018-04-28 DIAGNOSIS — Z85828 Personal history of other malignant neoplasm of skin: Secondary | ICD-10-CM | POA: Insufficient documentation

## 2018-04-28 DIAGNOSIS — R197 Diarrhea, unspecified: Secondary | ICD-10-CM | POA: Diagnosis not present

## 2018-04-28 DIAGNOSIS — E119 Type 2 diabetes mellitus without complications: Secondary | ICD-10-CM | POA: Diagnosis not present

## 2018-04-28 DIAGNOSIS — Z79899 Other long term (current) drug therapy: Secondary | ICD-10-CM | POA: Diagnosis not present

## 2018-04-28 DIAGNOSIS — I10 Essential (primary) hypertension: Secondary | ICD-10-CM | POA: Diagnosis not present

## 2018-04-28 DIAGNOSIS — Z7984 Long term (current) use of oral hypoglycemic drugs: Secondary | ICD-10-CM | POA: Diagnosis not present

## 2018-04-28 LAB — BASIC METABOLIC PANEL
Anion gap: 14 (ref 5–15)
BUN: 32 mg/dL — ABNORMAL HIGH (ref 8–23)
CO2: 31 mmol/L (ref 22–32)
Calcium: 10 mg/dL (ref 8.9–10.3)
Chloride: 86 mmol/L — ABNORMAL LOW (ref 98–111)
Creatinine, Ser: 1.28 mg/dL — ABNORMAL HIGH (ref 0.44–1.00)
GFR calc Af Amer: 46 mL/min — ABNORMAL LOW (ref >60)
GFR calc non Af Amer: 40 mL/min — ABNORMAL LOW (ref >60)
Glucose, Bld: 148 mg/dL — ABNORMAL HIGH (ref 70–99)
Potassium: 3.4 mmol/L — ABNORMAL LOW (ref 3.5–5.1)
Sodium: 131 mmol/L — ABNORMAL LOW (ref 135–145)

## 2018-04-28 LAB — URINALYSIS, ROUTINE W REFLEX MICROSCOPIC
Bacteria, UA: NONE SEEN
Bilirubin Urine: NEGATIVE
Glucose, UA: NEGATIVE mg/dL
Ketones, ur: NEGATIVE mg/dL
Nitrite: NEGATIVE
Protein, ur: NEGATIVE mg/dL
Specific Gravity, Urine: 1.005 (ref 1.005–1.030)
pH: 5 (ref 5.0–8.0)

## 2018-04-28 LAB — CBC WITH DIFFERENTIAL/PLATELET
Basophils Absolute: 0 10*3/uL (ref 0.0–0.1)
Basophils Relative: 0 %
Eosinophils Absolute: 0 10*3/uL (ref 0.0–0.7)
Eosinophils Relative: 1 %
HCT: 34.3 % — ABNORMAL LOW (ref 36.0–46.0)
Hemoglobin: 12 g/dL (ref 12.0–15.0)
Lymphocytes Relative: 9 %
Lymphs Abs: 0.4 10*3/uL — ABNORMAL LOW (ref 0.7–4.0)
MCH: 31.6 pg (ref 26.0–34.0)
MCHC: 35 g/dL (ref 30.0–36.0)
MCV: 90.3 fL (ref 78.0–100.0)
Monocytes Absolute: 0.7 10*3/uL (ref 0.1–1.0)
Monocytes Relative: 15 %
Neutro Abs: 3.5 10*3/uL (ref 1.7–7.7)
Neutrophils Relative %: 75 %
Platelets: 282 10*3/uL (ref 150–400)
RBC: 3.8 MIL/uL — ABNORMAL LOW (ref 3.87–5.11)
RDW: 11.7 % (ref 11.5–15.5)
WBC: 4.6 10*3/uL (ref 4.0–10.5)

## 2018-04-28 MED ORDER — ONDANSETRON HCL 8 MG PO TABS: 8.0000 mg | ORAL_TABLET | Freq: Three times a day (TID) | ORAL | 0 refills | Status: DC | PRN

## 2018-04-28 MED ORDER — SODIUM CHLORIDE 0.9 % IV BOLUS
1000.0000 mL | Freq: Once | INTRAVENOUS | Status: AC
  Administered 2018-04-28: 1000 mL via INTRAVENOUS

## 2018-04-28 MED ORDER — ONDANSETRON HCL 4 MG/2ML IJ SOLN
4.0000 mg | Freq: Once | INTRAMUSCULAR | Status: AC
  Administered 2018-04-28: 4 mg via INTRAVENOUS
  Filled 2018-04-28: qty 2

## 2018-04-28 MED ORDER — LOPERAMIDE HCL 2 MG PO CAPS
4.0000 mg | ORAL_CAPSULE | Freq: Once | ORAL | Status: AC
  Administered 2018-04-28: 4 mg via ORAL
  Filled 2018-04-28: qty 2

## 2018-04-28 MED ORDER — SODIUM CHLORIDE 0.9 % IV BOLUS
500.0000 mL | Freq: Once | INTRAVENOUS | Status: AC
  Administered 2018-04-28: 500 mL via INTRAVENOUS

## 2018-04-28 NOTE — ED Triage Notes (Signed)
Patient is from home and transported via [facility]. Patient is complaining of nausea and diarrhea. Denies vomiting. Patient reported to EMS she didn't come yesterday because she had errands to do despite feeling bad. EMS reports she was sitting on her front porch when EMS arrived. Patient reports she took 6 Imodiums for her symptoms, last diarrhea was last night. Also, states she had chills and a unmeasured fever last night.

## 2018-04-28 NOTE — ED Provider Notes (Signed)
[facility] DEPT Provider Note   CSN: [id number] Arrival date & time: 04/28/18  1352     History   Chief Complaint Chief Complaint  Patient presents with  . Nausea  . Diarrhea    HPI Monica Faulkner is a 77 y.o. female.  HPI   77 year old female with nausea, vomiting and diarrhea.  Onset 3 days ago.  Persistent since then.  She initially said she was having diarrhea "like dysentery."  She took multiple Imodium yesterday and has not had any more diarrhea since last night.  She is continued to feel very nauseated and vomiting earlier today.  Mild diffuse abdominal pain.  Subjective fevers last night.  No blood in her stool or emesis.  No sick contacts.  Past Medical History:  Diagnosis Date  . Anxiety    seen at [facility], for extreme anxiety. As a result of her anxiety  she had ECHO  . Cancer ([place])    basal cell   . Complication of anesthesia    atropine increased BP & increased pulse  . Depression   . Diabetes mellitus    type 2- 2006, managed with diet  . GERD (gastroesophageal reflux disease)    rare occurence  . H/O hiatal hernia   . Headache(784.0)    caused by sinus congestion fr. time to time   . Hypertension     Patient Active Problem List   Diagnosis Date Noted  . Macular hole 02/18/2012    Past Surgical History:  Procedure Laterality Date  . ABDOMINAL HYSTERECTOMY     1991-scar later had to be further repaired   . APPENDECTOMY     L ovarian cyst - ruptured & appendectomy done at same time.   Marland Kitchen EYE SURGERY     R eye- macular hole repair- 2008  . GAS INSERTION  03/18/2012   Procedure: INSERTION OF GAS;  Surgeon: Hayden Pedro, MD;  Location: [place] [facility];  Service: Ophthalmology;  Laterality: Left;  . HERNIA REPAIR     1974- inguinal repair  . PARS PLANA VITRECTOMY  03/18/2012   Procedure: PARS PLANA VITRECTOMY WITH 25 GAUGE;  Surgeon: Hayden Pedro, MD;  Location: [place];  Service: Ophthalmology;  Laterality: Left;  25 GAUGE PPV  FOR MACULAR HOLE  . TEAR DUCT PROBING     as an infant  . TONSILLECTOMY     and adenioidectomy- as a child  . VITRECTOMY  03/18/2012     OB History   None      Home Medications    Prior to Admission medications   Medication Sig Start Date End Date Taking? Authorizing Provider  atenolol (TENORMIN) 25 MG tablet Take 25 mg by mouth every evening.   Yes [provider]  cholecalciferol (VITAMIN D) 1000 units tablet Take 1,000 Units by mouth daily.   Yes [provider]  clonazePAM (KLONOPIN) 0.5 MG tablet Take 1 mg by mouth at bedtime. May repeat one tablet as needed for sleep 04/23/18  Yes [provider]  cloNIDine (CATAPRES) 0.2 MG tablet Take 0.2 mg by mouth at bedtime.  04/23/18  Yes [provider]  diphenhydrAMINE (BENADRYL) 25 mg capsule Take 25 mg by mouth at bedtime.   Yes [provider]  doxepin (SINEQUAN) 25 MG capsule Take 25 mg by mouth at bedtime. 03/24/18  Yes [provider]  escitalopram (LEXAPRO) 10 MG tablet Take 10 mg by mouth every morning.   Yes [provider]  ibuprofen (ADVIL,MOTRIN)  200 MG tablet Take 200 mg by mouth daily as needed. for pain   Yes [provider]  JANUVIA 100 MG tablet Take 100 mg by mouth daily. 04/01/18  Yes [provider]  lisinopril-hydrochlorothiazide (PRINZIDE,ZESTORETIC) 20-25 MG tablet Take 1 tablet by mouth daily. 04/22/18  Yes [provider]  Loperamide HCl (IMODIUM A-D PO) Take 2 tablets by mouth every hour as needed (diarrhea).   Yes [provider]  pravastatin (PRAVACHOL) 20 MG tablet Take 20 mg by mouth every other day.  02/24/18  Yes [provider]  ranitidine (ZANTAC) 150 MG tablet Take 150 mg by mouth daily as needed for heartburn.    Yes [provider]  zolpidem (AMBIEN) 5 MG tablet Take 5 mg by mouth daily. 04/13/18  Yes [provider]  ondansetron (ZOFRAN) 4 MG tablet Take 1 tablet (4 mg total) by mouth  every 6 (six) hours. Patient not taking: Reported on 04/28/2018 12/11/13   Tyrell Antonio    Family History History reviewed. No pertinent family history.  Social History Social History   Tobacco Use  . Smoking status: Never Smoker  . Smokeless tobacco: Never Used  Substance Use Topics  . Alcohol use: No  . Drug use: No     Allergies   Avapro [irbesartan]; Calcium channel blockers; Decongestant [pseudoephedrine]; Norvasc [amlodipine besylate]; and Sulfa antibiotics   Review of Systems Review of Systems  All systems reviewed and negative, other than as noted in HPI.  Physical Exam Updated Vital Signs BP (!) 104/43 (BP Location: Right Arm)   Pulse 63   Temp 98.3 F (36.8 C) (Oral)   Resp 18   Ht 5\' 5"  (1.651 m)   Wt 76.2 kg   SpO2 96%   BMI 27.96 kg/m   Physical Exam  Constitutional: She appears well-developed and well-nourished. No distress.  HENT:  Head: Normocephalic and atraumatic.  Eyes: Conjunctivae are normal. Right eye exhibits no discharge. Left eye exhibits no discharge.  Neck: Neck supple.  Cardiovascular: Normal rate, regular rhythm and normal heart sounds. Exam reveals no gallop and no friction rub.  No murmur heard. Pulmonary/Chest: Effort normal and breath sounds normal. No respiratory distress.  Abdominal: Soft. She exhibits no distension. There is tenderness.  Mild diffuse tenderness  Musculoskeletal: She exhibits no edema or tenderness.  Neurological: She is alert.  Skin: Skin is warm and dry.  Psychiatric: She has a normal mood and affect. Her behavior is normal. Thought content normal.  Nursing note and vitals reviewed.    ED Treatments / Results  Labs (all labs ordered are listed, but only abnormal results are displayed) Labs Reviewed  CBC WITH DIFFERENTIAL/PLATELET - Abnormal; Notable for the following components:      Result Value   RBC 3.80 (*)    HCT 34.3 (*)    Lymphs Abs 0.4 (*)    All other components within normal  limits  BASIC METABOLIC PANEL - Abnormal; Notable for the following components:   Sodium 131 (*)    Potassium 3.4 (*)    Chloride 86 (*)    Glucose, Bld 148 (*)    BUN 32 (*)    Creatinine, Ser 1.28 (*)    GFR calc non Af Amer 40 (*)    GFR calc Af Amer 46 (*)    All other components within normal limits  C DIFFICILE QUICK SCREEN W PCR REFLEX  URINALYSIS, ROUTINE W REFLEX MICROSCOPIC    EKG None  Radiology No results  found.  Procedures Procedures (including critical care time)  Medications Ordered in ED Medications  sodium chloride 0.9 % bolus 1,000 mL (1,000 mLs Intravenous New Bag/Given 04/28/18 1516)  ondansetron (ZOFRAN) injection 4 mg (4 mg Intravenous Given 04/28/18 1514)  loperamide (IMODIUM) capsule 4 mg (4 mg Oral Given 04/28/18 1514)  sodium chloride 0.9 % bolus 500 mL (500 mLs Intravenous New Bag/Given 04/28/18 1617)     Initial Impression / Assessment and Plan / ED Course  I have reviewed the triage vital signs and the nursing notes.  Pertinent labs & imaging results that were available during my care of the patient were reviewed by me and considered in my medical decision making (see chart for details).     77 year old female with vomiting diarrhea.  Denies any diarrhea since last night though.  Persistently nauseated throughout today though.  Afebrile here in the emergency room.  Nontoxic appearance.  Mild diffuse abdominal tenderness without focality.  Certainly no peritonitis.  Appears to have dehydration/AKI.  Creatinine is approximately double from prior values although these were for several years ago.  She was treated with IV fluids and antiemetics.  Suspect this is a viral GI illness.  Reassess after fluids and meds.  Final Clinical Impressions(s) / ED Diagnoses   Final diagnoses:  Nausea vomiting and diarrhea    ED Discharge Orders    None       Virgel Manifold, MD 04/28/18 [phone]

## 2018-04-28 NOTE — Discharge Instructions (Addendum)
Make sure you are drinking plenty of fluids and eat a low fiber diet.  Use the nausea medicine if needed to improve your appetite.  You can continue to use Imodium for diarrhea.  Follow-up with your primary care doctor if not better in 2 or 3 days.

## 2018-04-28 NOTE — ED Notes (Signed)
Patient giver gingerale and crackers. Patient unable to void at this time.

## 2018-04-28 NOTE — ED Provider Notes (Signed)
18: 15-evaluation after treatment to clear for discharge.  She has been able to eat crackers and peanut butter and drink fluids without vomiting.  She states that she feels better and would like to try going home.  She has Imodium to use for diarrhea.  She would like some antiemetics sent to her pharmacy.  Patient is comfortable now and has no further complaints or concerns.  We discussed symptomatic treatment and follow-up with PCP for further problems.   Daleen Bo, MD 04/28/18 [phone]

## 2018-04-28 NOTE — ED Notes (Signed)
Patient aware we need a urine and stool sample. Patient will call out when ready to void.

## 2018-04-29 ENCOUNTER — Emergency Department (HOSPITAL_COMMUNITY)
Admission: EM | Admit: 2018-04-29 | Discharge: 2018-04-29 | Disposition: A | Discharge status: Home / Self Care | Payer: Medicare Other | Attending: Emergency Medicine | Admitting: Emergency Medicine

## 2018-04-29 ENCOUNTER — Emergency Department (HOSPITAL_COMMUNITY): Payer: Medicare Other

## 2018-04-29 ENCOUNTER — Encounter (HOSPITAL_COMMUNITY): Payer: Self-pay

## 2018-04-29 ENCOUNTER — Other Ambulatory Visit: Payer: Self-pay

## 2018-04-29 DIAGNOSIS — E119 Type 2 diabetes mellitus without complications: Secondary | ICD-10-CM | POA: Insufficient documentation

## 2018-04-29 DIAGNOSIS — Z79899 Other long term (current) drug therapy: Secondary | ICD-10-CM | POA: Diagnosis not present

## 2018-04-29 DIAGNOSIS — K5289 Other specified noninfective gastroenteritis and colitis: Secondary | ICD-10-CM | POA: Insufficient documentation

## 2018-04-29 DIAGNOSIS — I10 Essential (primary) hypertension: Secondary | ICD-10-CM | POA: Insufficient documentation

## 2018-04-29 DIAGNOSIS — R197 Diarrhea, unspecified: Secondary | ICD-10-CM

## 2018-04-29 DIAGNOSIS — K529 Noninfective gastroenteritis and colitis, unspecified: Secondary | ICD-10-CM

## 2018-04-29 DIAGNOSIS — Z7984 Long term (current) use of oral hypoglycemic drugs: Secondary | ICD-10-CM | POA: Diagnosis not present

## 2018-04-29 LAB — I-STAT CG4 LACTIC ACID, ED: LACTIC ACID, VENOUS: 0.58 mmol/L (ref 0.5–1.9)

## 2018-04-29 LAB — C DIFFICILE QUICK SCREEN W PCR REFLEX
C DIFFICILE (CDIFF) TOXIN: NEGATIVE
C Diff antigen: NEGATIVE
C Diff interpretation: NOT DETECTED

## 2018-04-29 LAB — CBC WITH DIFFERENTIAL/PLATELET
Basophils Absolute: 0 10*3/uL (ref 0.0–0.1)
Basophils Relative: 0 %
Eosinophils Absolute: 0.1 10*3/uL (ref 0.0–0.7)
Eosinophils Relative: 3 %
HEMATOCRIT: 31.1 % — AB (ref 36.0–46.0)
Hemoglobin: 10.8 g/dL — ABNORMAL LOW (ref 12.0–15.0)
LYMPHS ABS: 0.7 10*3/uL (ref 0.7–4.0)
Lymphocytes Relative: 13 %
MCH: 31.4 pg (ref 26.0–34.0)
MCHC: 34.7 g/dL (ref 30.0–36.0)
MCV: 90.4 fL (ref 78.0–100.0)
MONO ABS: 1 10*3/uL (ref 0.1–1.0)
Monocytes Relative: 21 %
Neutro Abs: 3.1 10*3/uL (ref 1.7–7.7)
Neutrophils Relative %: 63 %
PLATELETS: 239 10*3/uL (ref 150–400)
RBC: 3.44 MIL/uL — ABNORMAL LOW (ref 3.87–5.11)
RDW: 11.8 % (ref 11.5–15.5)
WBC: 4.9 10*3/uL (ref 4.0–10.5)

## 2018-04-29 LAB — COMPREHENSIVE METABOLIC PANEL
ALT: 10 U/L (ref 0–44)
AST: 15 U/L (ref 15–41)
Albumin: 3.3 g/dL — ABNORMAL LOW (ref 3.5–5.0)
Alkaline Phosphatase: 36 U/L — ABNORMAL LOW (ref 38–126)
Anion gap: 10 (ref 5–15)
BILIRUBIN TOTAL: 0.7 mg/dL (ref 0.3–1.2)
BUN: 24 mg/dL — ABNORMAL HIGH (ref 8–23)
CO2: 28 mmol/L (ref 22–32)
Calcium: 9 mg/dL (ref 8.9–10.3)
Chloride: 92 mmol/L — ABNORMAL LOW (ref 98–111)
Creatinine, Ser: 1.03 mg/dL — ABNORMAL HIGH (ref 0.44–1.00)
GFR calc non Af Amer: 51 mL/min — ABNORMAL LOW (ref >60)
GFR, EST AFRICAN AMERICAN: 60 mL/min — AB (ref >60)
Glucose, Bld: 120 mg/dL — ABNORMAL HIGH (ref 70–99)
POTASSIUM: 3.1 mmol/L — AB (ref 3.5–5.1)
Sodium: 130 mmol/L — ABNORMAL LOW (ref 135–145)
Total Protein: 5.9 g/dL — ABNORMAL LOW (ref 6.5–8.1)

## 2018-04-29 LAB — URINALYSIS, ROUTINE W REFLEX MICROSCOPIC
Bacteria, UA: NONE SEEN
Bilirubin Urine: NEGATIVE
Glucose, UA: NEGATIVE mg/dL
Ketones, ur: NEGATIVE mg/dL
Nitrite: NEGATIVE
PROTEIN: NEGATIVE mg/dL
Specific Gravity, Urine: 1.004 — ABNORMAL LOW (ref 1.005–1.030)
pH: 6 (ref 5.0–8.0)

## 2018-04-29 LAB — LIPASE, BLOOD: LIPASE: 31 U/L (ref 11–51)

## 2018-04-29 MED ORDER — CIPROFLOXACIN HCL 500 MG PO TABS: 500.0000 mg | ORAL_TABLET | Freq: Two times a day (BID) | ORAL | 0 refills | Status: DC

## 2018-04-29 MED ORDER — SODIUM CHLORIDE 0.9 % IV BOLUS
1000.0000 mL | Freq: Once | INTRAVENOUS | Status: AC
Start: 2018-04-29 — End: 2018-04-29
  Administered 2018-04-29: 1000 mL via INTRAVENOUS

## 2018-04-29 MED ORDER — IOPAMIDOL (ISOVUE-300) INJECTION 61%
INTRAVENOUS | Status: AC
  Administered 2018-04-29: 100 mL
  Filled 2018-04-29: qty 100

## 2018-04-29 MED ORDER — METRONIDAZOLE 500 MG PO TABS
500.0000 mg | ORAL_TABLET | Freq: Once | ORAL | Status: AC
  Administered 2018-04-29: 500 mg via ORAL
  Filled 2018-04-29: qty 1

## 2018-04-29 MED ORDER — POTASSIUM CHLORIDE CRYS ER 20 MEQ PO TBCR
40.0000 meq | EXTENDED_RELEASE_TABLET | Freq: Once | ORAL | Status: AC
  Administered 2018-04-29: 40 meq via ORAL
  Filled 2018-04-29: qty 2

## 2018-04-29 MED ORDER — CIPROFLOXACIN HCL 500 MG PO TABS
500.0000 mg | ORAL_TABLET | Freq: Once | ORAL | Status: AC
  Administered 2018-04-29: 500 mg via ORAL
  Filled 2018-04-29: qty 1

## 2018-04-29 MED ORDER — METRONIDAZOLE 500 MG PO TABS: 500.0000 mg | ORAL_TABLET | Freq: Two times a day (BID) | ORAL | 0 refills | Status: DC

## 2018-04-29 NOTE — ED Triage Notes (Signed)
EMS reports from home, called for diarrhea since Monday, seen here yesterday for same. Dc'd with RX, Pt has not picked up Rx yet, increasing weakness.  BP 108/50 HR 62 RR 16 Sp02 95 RA CBG 109

## 2018-04-29 NOTE — ED Provider Notes (Signed)
[facility] DEPT Provider Note   CSN: [id number] Arrival date & time: 04/29/18  1022     History   Chief Complaint Chief Complaint  Patient presents with  . Diarrhea    HPI Monica Faulkner is a 77 y.o. female hx of diabetes, hypertension, here presenting with diarrhea.  Patient has been having 6-8 episodes of watery diarrhea daily for the last 4 days.  Patient states that she has been cleaning out her cat liters and she was concerned that maybe she had an infection from that.  She was in particular concerned about Giardia.  But she denies any travel anywhere or drinking spring water.  She denies eating any uncooked food or vomiting.  She has subjective fevers and chills but did not take her temperature at home.  She denies any history of C. difficile and was not on any antibiotics recently.  Of note, patient was seen in the ED yesterday and was noted to have a mild AKI and was given IV fluids and felt better.  She states that since last night she had poor appetite and continual diarrhea so came here for reevaluation.  The history is provided by the patient.    Past Medical History:  Diagnosis Date  . Anxiety    seen at [facility], for extreme anxiety. As a result of her anxiety  she had ECHO  . Cancer ([place])    basal cell   . Complication of anesthesia    atropine increased BP & increased pulse  . Depression   . Diabetes mellitus    type 2- 2006, managed with diet  . GERD (gastroesophageal reflux disease)    rare occurence  . H/O hiatal hernia   . Headache(784.0)    caused by sinus congestion fr. time to time   . Hypertension     Patient Active Problem List   Diagnosis Date Noted  . Macular hole 02/18/2012    Past Surgical History:  Procedure Laterality Date  . ABDOMINAL HYSTERECTOMY     1991-scar later had to be further repaired   . APPENDECTOMY     L ovarian cyst - ruptured & appendectomy done at same time.   Marland Kitchen EYE SURGERY     R eye-  macular hole repair- 2008  . GAS INSERTION  03/18/2012   Procedure: INSERTION OF GAS;  Surgeon: Hayden Pedro, MD;  Location: [facility];  Service: Ophthalmology;  Laterality: Left;  . HERNIA REPAIR     1974- inguinal repair  . PARS PLANA VITRECTOMY  03/18/2012   Procedure: PARS PLANA VITRECTOMY WITH 25 GAUGE;  Surgeon: Hayden Pedro, MD;  Location: [place];  Service: Ophthalmology;  Laterality: Left;  25 GAUGE PPV FOR MACULAR HOLE  . TEAR DUCT PROBING     as an infant  . TONSILLECTOMY     and adenioidectomy- as a child  . VITRECTOMY  03/18/2012     OB History   None      Home Medications    Prior to Admission medications   Medication Sig Start Date End Date Taking? Authorizing Provider  atenolol (TENORMIN) 25 MG tablet Take 25 mg by mouth every evening.   Yes [provider]  cholecalciferol (VITAMIN D) 1000 units tablet Take 1,000 Units by mouth daily.   Yes [provider]  clonazePAM (KLONOPIN) 0.5 MG tablet Take 1 mg by mouth at bedtime. May repeat one tablet as needed for sleep 04/23/18  Yes [provider]  cloNIDine (  CATAPRES) 0.2 MG tablet Take 0.2 mg by mouth at bedtime.  04/23/18  Yes [provider]  diphenhydrAMINE (BENADRYL) 25 mg capsule Take 25 mg by mouth at bedtime as needed for allergies.    Yes [provider]  doxepin (SINEQUAN) 25 MG capsule Take 25 mg by mouth at bedtime as needed (sleep).  03/24/18  Yes [provider]  escitalopram (LEXAPRO) 10 MG tablet Take 10 mg by mouth every morning.   Yes [provider]  ibuprofen (ADVIL,MOTRIN) 200 MG tablet Take 200 mg by mouth daily as needed for fever or mild pain.    Yes [provider]  JANUVIA 100 MG tablet Take 100 mg by mouth daily. 04/01/18  Yes [provider]  lisinopril-hydrochlorothiazide (PRINZIDE,ZESTORETIC) 20-25 MG tablet Take 1 tablet by mouth daily. 04/22/18  Yes [provider]  Loperamide HCl (IMODIUM A-D PO) Take 2 tablets  by mouth every hour as needed (diarrhea).   Yes [provider]  pravastatin (PRAVACHOL) 20 MG tablet Take 20 mg by mouth every other day.  02/24/18  Yes [provider]  ranitidine (ZANTAC) 150 MG tablet Take 150 mg by mouth daily as needed for heartburn.    Yes [provider]  ondansetron (ZOFRAN) 8 MG tablet Take 1 tablet (8 mg total) by mouth every 8 (eight) hours as needed for nausea or vomiting. 04/28/18   Daleen Bo, MD    Family History History reviewed. No pertinent family history.  Social History Social History   Tobacco Use  . Smoking status: Never Smoker  . Smokeless tobacco: Never Used  Substance Use Topics  . Alcohol use: No  . Drug use: No     Allergies   Avapro [irbesartan]; Calcium channel blockers; Decongestant [pseudoephedrine]; Norvasc [amlodipine besylate]; and Sulfa antibiotics   Review of Systems Review of Systems  Gastrointestinal: Positive for diarrhea.  All other systems reviewed and are negative.    Physical Exam Updated Vital Signs BP (!) 110/41   Pulse 60   Temp 98.1 F (36.7 C)   Resp 16   SpO2 98%   Physical Exam  Constitutional: She is oriented to person, place, and time.  Slightly dehydrated   HENT:  Head: Normocephalic.  MM slightly dry   Eyes: Pupils are equal, round, and reactive to light. Conjunctivae and EOM are normal.  Neck: Normal range of motion. Neck supple.  Cardiovascular: Normal rate, regular rhythm and normal heart sounds.  Pulmonary/Chest: Effort normal and breath sounds normal. No stridor. No respiratory distress.  Abdominal: Soft. Bowel sounds are normal.  Mild diffuse lower abdominal tenderness, no rebound or guarding   Musculoskeletal: Normal range of motion.  Neurological: She is alert and oriented to person, place, and time. No cranial nerve deficit. Coordination normal.  Skin: Skin is warm.  Psychiatric: She has a normal mood and affect.  Nursing note and vitals  reviewed.    ED Treatments / Results  Labs (all labs ordered are listed, but only abnormal results are displayed) Labs Reviewed  CBC WITH DIFFERENTIAL/PLATELET - Abnormal; Notable for the following components:      Result Value   RBC 3.44 (*)    Hemoglobin 10.8 (*)    HCT 31.1 (*)    All other components within normal limits  COMPREHENSIVE METABOLIC PANEL - Abnormal; Notable for the following components:   Sodium 130 (*)    Potassium 3.1 (*)    Chloride 92 (*)    Glucose, Bld 120 (*)  BUN 24 (*)    Creatinine, Ser 1.03 (*)    Total Protein 5.9 (*)    Albumin 3.3 (*)    Alkaline Phosphatase 36 (*)    GFR calc non Af Amer 51 (*)    GFR calc Af Amer 60 (*)    All other components within normal limits  URINALYSIS, ROUTINE W REFLEX MICROSCOPIC - Abnormal; Notable for the following components:   Specific Gravity, Urine 1.004 (*)    Hgb urine dipstick SMALL (*)    Leukocytes, UA TRACE (*)    All other components within normal limits  C DIFFICILE QUICK SCREEN W PCR REFLEX  GASTROINTESTINAL PANEL BY PCR, STOOL (REPLACES STOOL CULTURE)  LIPASE, BLOOD  I-STAT CG4 LACTIC ACID, ED    EKG None  Radiology Ct Abdomen Pelvis W Contrast  Result Date: 04/29/2018 CLINICAL DATA:  Increasing weakness and diarrhea. EXAM: CT ABDOMEN AND PELVIS WITH CONTRAST TECHNIQUE: Multidetector CT imaging of the abdomen and pelvis was performed using the standard protocol following bolus administration of intravenous contrast. CONTRAST:  12mL ISOVUE-300 IOPAMIDOL (ISOVUE-300) INJECTION 61% COMPARISON:  06/13/2011 FINDINGS: Lower chest: Bilateral lower lobe atelectatic changes. Hepatobiliary: No focal liver abnormality is seen. Gallstones within the dependent portion of the gallbladder. No gallbladder wall thickening, or biliary dilatation. Pancreas: Unremarkable. No pancreatic ductal dilatation or surrounding inflammatory changes. Spleen: Splenic granulomata. Adrenals/Urinary Tract: Adrenal glands are  unremarkable. Kidneys are normal, without renal calculi, focal lesion, or hydronephrosis. Bladder is unremarkable. Stomach/Bowel: Stomach is within normal limits. No evidence of appendicitis. No evidence of small bowel wall thickening, distention, or inflammatory changes. Circumferential thickening of the sigmoid colon and rectum. Scattered left colonic diverticula. Vascular/Lymphatic: Aortic atherosclerosis. No enlarged abdominal or pelvic lymph nodes. Reproductive: Status post hysterectomy. No adnexal masses. Other: No abdominal wall hernia or abnormality. No abdominopelvic ascites. Musculoskeletal: S shaped scoliosis of the spine. Multifocal spondylosis. IMPRESSION: Long segment of circumferential mucosal thickening of the sigmoid colon and rectum, most suggestive of colitis, which may or may not be related to diverticulitis. Cholelithiasis. Electronically Signed   By: Fidela Salisbury M.D.   On: 04/29/2018 13:09    Procedures Procedures (including critical care time)  Medications Ordered in ED Medications  ciprofloxacin (CIPRO) tablet 500 mg (has no administration in time range)  metroNIDAZOLE (FLAGYL) tablet 500 mg (has no administration in time range)  sodium chloride 0.9 % bolus 1,000 mL (0 mLs Intravenous Stopped 04/29/18 1204)  potassium chloride SA (K-DUR,KLOR-CON) CR tablet 40 mEq (40 mEq Oral Given 04/29/18 1221)  iopamidol (ISOVUE-300) 61 % injection (100 mLs  Contrast Given 04/29/18 1239)     Initial Impression / Assessment and Plan / ED Course  I have reviewed the triage vital signs and the nursing notes.  Pertinent labs & imaging results that were available during my care of the patient were reviewed by me and considered in my medical decision making (see chart for details).     Monica Faulkner is a 77 y.o. female here with diarrhea, abdominal pain. Consider persistent gastroenteritis vs colitis vs C diff. Will repeat labs and UA, get CT ab/pel, GI pathogen and C diff. Will  hydrate and reassess.   2:16 PM WBC nl. Lactate nl. C diff negative. CT showed colitis. Patient afebrile, no vomiting in the ED. I think likely viral. She is concerned for possible giardia from changing cat litter. I think it is unlikely but GI pathogen panel sent. Will dc home with cipro, flagyl empirically. Gave strict return precautions.  Final Clinical Impressions(s) / ED Diagnoses   Final diagnoses:  None    ED Discharge Orders    None       Drenda Freeze, MD 04/29/18 1417

## 2018-04-29 NOTE — ED Notes (Signed)
Bed: WA09 Expected date:  Expected time:  Means of arrival:  Comments: EMS diarrhea

## 2018-04-29 NOTE — Discharge Instructions (Signed)
Stay hydrated.   Take cipro, flagyl as prescribed. It will treat most infectious diarrhea.   See your doctor  GI pathogen panel was sent and we will get results back in 2-3 days   Return to ER if you have fever, dehydration, severe abdominal pain, vomiting

## 2018-04-30 LAB — GASTROINTESTINAL PANEL BY PCR, STOOL (REPLACES STOOL CULTURE)
Adenovirus F40/41: NOT DETECTED
Astrovirus: NOT DETECTED
CRYPTOSPORIDIUM: NOT DETECTED
CYCLOSPORA CAYETANENSIS: NOT DETECTED
Campylobacter species: NOT DETECTED
Entamoeba histolytica: NOT DETECTED
Enteroaggregative E coli (EAEC): NOT DETECTED
Enteropathogenic E coli (EPEC): DETECTED — AB
Enterotoxigenic E coli (ETEC): NOT DETECTED
Giardia lamblia: NOT DETECTED
Norovirus GI/GII: NOT DETECTED
PLESIMONAS SHIGELLOIDES: NOT DETECTED
ROTAVIRUS A: NOT DETECTED
SALMONELLA SPECIES: NOT DETECTED
SAPOVIRUS (I, II, IV, AND V): NOT DETECTED
Shiga like toxin producing E coli (STEC): NOT DETECTED
Shigella/Enteroinvasive E coli (EIEC): NOT DETECTED
Vibrio cholerae: NOT DETECTED
Vibrio species: NOT DETECTED
YERSINIA ENTEROCOLITICA: NOT DETECTED

## 2018-07-02 ENCOUNTER — Other Ambulatory Visit: Payer: Self-pay

## 2018-07-02 ENCOUNTER — Inpatient Hospital Stay (HOSPITAL_COMMUNITY)
Admission: EM | Admit: 2018-07-02 | Discharge: 2018-07-06 | DRG: 641 | Disposition: A | Discharge status: Home Health | Payer: Medicare Other | Attending: Internal Medicine | Admitting: Internal Medicine

## 2018-07-02 ENCOUNTER — Encounter (HOSPITAL_COMMUNITY): Payer: Self-pay

## 2018-07-02 DIAGNOSIS — Z90710 Acquired absence of both cervix and uterus: Secondary | ICD-10-CM

## 2018-07-02 DIAGNOSIS — I10 Essential (primary) hypertension: Secondary | ICD-10-CM | POA: Diagnosis present

## 2018-07-02 DIAGNOSIS — E785 Hyperlipidemia, unspecified: Secondary | ICD-10-CM | POA: Diagnosis present

## 2018-07-02 DIAGNOSIS — R197 Diarrhea, unspecified: Secondary | ICD-10-CM

## 2018-07-02 DIAGNOSIS — E871 Hypo-osmolality and hyponatremia: Principal | ICD-10-CM | POA: Diagnosis present

## 2018-07-02 DIAGNOSIS — A044 Other intestinal Escherichia coli infections: Secondary | ICD-10-CM | POA: Diagnosis present

## 2018-07-02 DIAGNOSIS — Z79899 Other long term (current) drug therapy: Secondary | ICD-10-CM

## 2018-07-02 DIAGNOSIS — E119 Type 2 diabetes mellitus without complications: Secondary | ICD-10-CM | POA: Diagnosis present

## 2018-07-02 DIAGNOSIS — E876 Hypokalemia: Secondary | ICD-10-CM | POA: Diagnosis present

## 2018-07-02 DIAGNOSIS — Z85828 Personal history of other malignant neoplasm of skin: Secondary | ICD-10-CM

## 2018-07-02 DIAGNOSIS — Z7984 Long term (current) use of oral hypoglycemic drugs: Secondary | ICD-10-CM

## 2018-07-02 DIAGNOSIS — E860 Dehydration: Secondary | ICD-10-CM | POA: Diagnosis present

## 2018-07-02 DIAGNOSIS — T502X5A Adverse effect of carbonic-anhydrase inhibitors, benzothiadiazides and other diuretics, initial encounter: Secondary | ICD-10-CM | POA: Diagnosis present

## 2018-07-02 LAB — CBC WITH DIFFERENTIAL/PLATELET
Abs Immature Granulocytes: 0.02 10*3/uL (ref 0.00–0.07)
Basophils Absolute: 0 10*3/uL (ref 0.0–0.1)
Basophils Relative: 0 %
Eosinophils Absolute: 0.1 10*3/uL (ref 0.0–0.5)
Eosinophils Relative: 2 %
HCT: 32.5 % — ABNORMAL LOW (ref 36.0–46.0)
Hemoglobin: 11.2 g/dL — ABNORMAL LOW (ref 12.0–15.0)
Immature Granulocytes: 0 %
Lymphocytes Relative: 15 %
Lymphs Abs: 0.9 10*3/uL (ref 0.7–4.0)
MCH: 30.7 pg (ref 26.0–34.0)
MCHC: 34.5 g/dL (ref 30.0–36.0)
MCV: 89 fL (ref 80.0–100.0)
Monocytes Absolute: 0.8 10*3/uL (ref 0.1–1.0)
Monocytes Relative: 14 %
Neutro Abs: 3.9 10*3/uL (ref 1.7–7.7)
Neutrophils Relative %: 69 %
Platelets: 226 10*3/uL (ref 150–400)
RBC: 3.65 MIL/uL — ABNORMAL LOW (ref 3.87–5.11)
RDW: 11.6 % (ref 11.5–15.5)
WBC: 5.6 10*3/uL (ref 4.0–10.5)
nRBC: 0 % (ref 0.0–0.2)

## 2018-07-02 LAB — BASIC METABOLIC PANEL
Anion gap: 11 (ref 5–15)
BUN: 22 mg/dL (ref 8–23)
CO2: 29 mmol/L (ref 22–32)
Calcium: 9.2 mg/dL (ref 8.9–10.3)
Chloride: 83 mmol/L — ABNORMAL LOW (ref 98–111)
Creatinine, Ser: 0.95 mg/dL (ref 0.44–1.00)
GFR calc Af Amer: >60 mL/min (ref >60)
GFR calc non Af Amer: 57 mL/min — ABNORMAL LOW (ref >60)
Glucose, Bld: 108 mg/dL — ABNORMAL HIGH (ref 70–99)
Potassium: 3.1 mmol/L — ABNORMAL LOW (ref 3.5–5.1)
Sodium: 123 mmol/L — ABNORMAL LOW (ref 135–145)

## 2018-07-02 LAB — MAGNESIUM: MAGNESIUM: 1.4 mg/dL — AB (ref 1.7–2.4)

## 2018-07-02 MED ORDER — ONDANSETRON HCL 4 MG/2ML IJ SOLN: 4.0000 mg | Freq: Four times a day (QID) | INTRAMUSCULAR | Status: DC | PRN

## 2018-07-02 MED ORDER — PRAVASTATIN SODIUM 20 MG PO TABS
20.0000 mg | ORAL_TABLET | ORAL | Status: DC
  Administered 2018-07-02 – 2018-07-06 (×3): 20 mg via ORAL
  Filled 2018-07-02 (×3): qty 1

## 2018-07-02 MED ORDER — SODIUM CHLORIDE 0.9 % IV BOLUS
500.0000 mL | Freq: Once | INTRAVENOUS | Status: AC
  Administered 2018-07-02: 500 mL via INTRAVENOUS

## 2018-07-02 MED ORDER — ACETAMINOPHEN 650 MG RE SUPP: 650.0000 mg | Freq: Four times a day (QID) | RECTAL | Status: DC | PRN

## 2018-07-02 MED ORDER — LOPERAMIDE HCL 2 MG PO CAPS
4.0000 mg | ORAL_CAPSULE | Freq: Once | ORAL | Status: AC
  Administered 2018-07-02: 4 mg via ORAL
  Filled 2018-07-02: qty 2

## 2018-07-02 MED ORDER — CLONAZEPAM 1 MG PO TABS
1.0000 mg | ORAL_TABLET | Freq: Every day | ORAL | Status: DC
  Administered 2018-07-02 – 2018-07-05 (×4): 1 mg via ORAL
  Filled 2018-07-02 (×4): qty 1

## 2018-07-02 MED ORDER — ONDANSETRON HCL 4 MG PO TABS
4.0000 mg | ORAL_TABLET | Freq: Four times a day (QID) | ORAL | Status: DC | PRN
  Administered 2018-07-03: 4 mg via ORAL
  Filled 2018-07-02: qty 1

## 2018-07-02 MED ORDER — CLONIDINE HCL 0.1 MG PO TABS
0.2000 mg | ORAL_TABLET | Freq: Every day | ORAL | Status: DC
  Administered 2018-07-02 – 2018-07-05 (×4): 0.2 mg via ORAL
  Filled 2018-07-02 (×4): qty 1

## 2018-07-02 MED ORDER — SODIUM CHLORIDE 0.9 % IV SOLN
INTRAVENOUS | Status: DC
  Administered 2018-07-02 – 2018-07-03 (×2): via INTRAVENOUS

## 2018-07-02 MED ORDER — ENOXAPARIN SODIUM 40 MG/0.4ML ~~LOC~~ SOLN
40.0000 mg | SUBCUTANEOUS | Status: DC
  Administered 2018-07-02 – 2018-07-05 (×4): 40 mg via SUBCUTANEOUS
  Filled 2018-07-02 (×4): qty 0.4

## 2018-07-02 MED ORDER — DIPHENHYDRAMINE HCL 25 MG PO CAPS: 25.0000 mg | ORAL_CAPSULE | Freq: Every evening | ORAL | Status: DC | PRN

## 2018-07-02 MED ORDER — ONDANSETRON HCL 4 MG/2ML IJ SOLN
4.0000 mg | Freq: Once | INTRAMUSCULAR | Status: AC
  Administered 2018-07-02: 4 mg via INTRAVENOUS
  Filled 2018-07-02: qty 2

## 2018-07-02 MED ORDER — DOXEPIN HCL 25 MG PO CAPS
25.0000 mg | ORAL_CAPSULE | Freq: Every evening | ORAL | Status: DC | PRN
  Filled 2018-07-02: qty 1

## 2018-07-02 MED ORDER — MORPHINE SULFATE (PF) 2 MG/ML IV SOLN
2.0000 mg | Freq: Once | INTRAVENOUS | Status: AC
  Administered 2018-07-02: 2 mg via INTRAVENOUS
  Filled 2018-07-02: qty 1

## 2018-07-02 MED ORDER — POTASSIUM CHLORIDE CRYS ER 20 MEQ PO TBCR
60.0000 meq | EXTENDED_RELEASE_TABLET | Freq: Once | ORAL | Status: AC
  Administered 2018-07-02: 60 meq via ORAL
  Filled 2018-07-02: qty 3

## 2018-07-02 MED ORDER — SODIUM CHLORIDE 0.9 % IV SOLN
INTRAVENOUS | Status: DC
  Administered 2018-07-03 – 2018-07-06 (×5): via INTRAVENOUS

## 2018-07-02 MED ORDER — NAPHAZOLINE-PHENIRAMINE 0.025-0.3 % OP SOLN
1.0000 [drp] | OPHTHALMIC | Status: DC | PRN
  Filled 2018-07-02: qty 15

## 2018-07-02 MED ORDER — ACETAMINOPHEN 325 MG PO TABS
650.0000 mg | ORAL_TABLET | Freq: Four times a day (QID) | ORAL | Status: DC | PRN
  Administered 2018-07-02 – 2018-07-05 (×4): 650 mg via ORAL
  Filled 2018-07-02 (×4): qty 2

## 2018-07-02 NOTE — ED Triage Notes (Signed)
Transported by [facility] from home--diarrhea since Wednesday. Associated s/s include nausea. Denies any vomiting or abdominal pain.

## 2018-07-02 NOTE — ED Notes (Signed)
Bed: WA17 Expected date:  Expected time:  Means of arrival:  Comments: EMS Diarrhea since Wed

## 2018-07-02 NOTE — ED Provider Notes (Signed)
[facility] DEPT Provider Note   CSN: [id number] Arrival date & time: 07/02/18  0998     History   Chief Complaint Chief Complaint  Patient presents with  . Diarrhea    HPI Monica Faulkner is a 77 y.o. female.  HPI  77 year old female with diarrhea.  Onset about 2 days ago.  She reports multiple watery stools per day.  She is concerned because she had profuse diarrhea couple months ago and stool studies grew Enteropathogenic E. coli.  She says she was treated with Cipro and Flagyl with complete resolution.  She is having some crampy lower abdominal pain.  No blood.  Some subjective fevers.  Nausea, but no vomiting.  No sick contacts.  She has been taking Imodium without much improvement.  Past Medical History:  Diagnosis Date  . Anxiety    seen at [facility], for extreme anxiety. As a result of her anxiety  she had ECHO  . Cancer ([place])    basal cell   . Complication of anesthesia    atropine increased BP & increased pulse  . Depression   . Diabetes mellitus    type 2- 2006, managed with diet  . GERD (gastroesophageal reflux disease)    rare occurence  . H/O hiatal hernia   . Headache(784.0)    caused by sinus congestion fr. time to time   . Hypertension     Patient Active Problem List   Diagnosis Date Noted  . Macular hole 02/18/2012    Past Surgical History:  Procedure Laterality Date  . ABDOMINAL HYSTERECTOMY     1991-scar later had to be further repaired   . APPENDECTOMY     L ovarian cyst - ruptured & appendectomy done at same time.   Marland Kitchen EYE SURGERY     R eye- macular hole repair- 2008  . GAS INSERTION  03/18/2012   Procedure: INSERTION OF GAS;  Surgeon: Hayden Pedro, MD;  Location: [facility];  Service: Ophthalmology;  Laterality: Left;  . HERNIA REPAIR     1974- inguinal repair  . PARS PLANA VITRECTOMY  03/18/2012   Procedure: PARS PLANA VITRECTOMY WITH 25 GAUGE;  Surgeon: Hayden Pedro, MD;  Location: [place] [facility];  Service:  Ophthalmology;  Laterality: Left;  25 GAUGE PPV FOR MACULAR HOLE  . TEAR DUCT PROBING     as an infant  . TONSILLECTOMY     and adenioidectomy- as a child  . VITRECTOMY  03/18/2012     OB History   None      Home Medications    Prior to Admission medications   Medication Sig Start Date End Date Taking? Authorizing Provider  atenolol (TENORMIN) 25 MG tablet Take 25 mg by mouth every evening.   Yes [provider]  cholecalciferol (VITAMIN D) 1000 units tablet Take 1,000 Units by mouth daily.   Yes [provider]  clonazePAM (KLONOPIN) 0.5 MG tablet Take 1 mg by mouth at bedtime. May repeat one tablet as needed for sleep 04/23/18  Yes [provider]  cloNIDine (CATAPRES) 0.2 MG tablet Take 0.2 mg by mouth at bedtime.  04/23/18  Yes [provider]  diphenhydrAMINE (BENADRYL) 25 mg capsule Take 25 mg by mouth at bedtime as needed for allergies.    Yes [provider]  doxepin (SINEQUAN) 25 MG capsule Take 25 mg by mouth at bedtime as needed (sleep).  03/24/18  Yes [provider]  escitalopram (LEXAPRO) 10 MG tablet Take 10 mg  by mouth every morning.   Yes [provider]  ibuprofen (ADVIL,MOTRIN) 200 MG tablet Take 200 mg by mouth daily as needed for fever or mild pain.    Yes [provider]  JANUVIA 100 MG tablet Take 100 mg by mouth daily. 04/01/18  Yes [provider]  lisinopril-hydrochlorothiazide (PRINZIDE,ZESTORETIC) 20-25 MG tablet Take 1 tablet by mouth daily. 04/22/18  Yes [provider]  Loperamide HCl (IMODIUM A-D PO) Take 2 tablets by mouth every hour as needed (diarrhea).   Yes [provider]  naphazoline-pheniramine (NAPHCON-A) 0.025-0.3 % ophthalmic solution Place 1 drop into both eyes as needed for eye irritation (dry eyes).   Yes [provider]  pravastatin (PRAVACHOL) 20 MG tablet Take 20 mg by mouth every other day.  02/24/18  Yes [provider]  zolpidem  (AMBIEN) 5 MG tablet Take 5 mg by mouth at bedtime as needed for sleep. 06/14/18  Yes [provider]  ciprofloxacin (CIPRO) 500 MG tablet Take 1 tablet (500 mg total) by mouth 2 (two) times daily. One po bid x 7 days Patient not taking: Reported on 07/02/2018 04/29/18   Drenda Freeze, MD  metroNIDAZOLE (FLAGYL) 500 MG tablet Take 1 tablet (500 mg total) by mouth 2 (two) times daily. One po bid x 7 days Patient not taking: Reported on 07/02/2018 04/29/18   Drenda Freeze, MD  ondansetron (ZOFRAN) 8 MG tablet Take 1 tablet (8 mg total) by mouth every 8 (eight) hours as needed for nausea or vomiting. Patient not taking: Reported on 07/02/2018 04/28/18   Daleen Bo, MD    Family History No family history on file.  Social History Social History   Tobacco Use  . Smoking status: Never Smoker  . Smokeless tobacco: Never Used  Substance Use Topics  . Alcohol use: No  . Drug use: No     Allergies   Avapro [irbesartan]; Calcium channel blockers; Decongestant [pseudoephedrine]; Norvasc [amlodipine besylate]; and Sulfa antibiotics   Review of Systems Review of Systems  All systems reviewed and negative, other than as noted in HPI.  Physical Exam Updated Vital Signs BP (!) 123/49   Pulse (!) 52   Temp 98.1 F (36.7 C) (Oral)   Resp 15   SpO2 93%   Physical Exam  Constitutional: She appears well-developed and well-nourished. No distress.  HENT:  Head: Normocephalic and atraumatic.  Eyes: Conjunctivae are normal. Right eye exhibits no discharge. Left eye exhibits no discharge.  Neck: Neck supple.  Cardiovascular: Normal rate, regular rhythm and normal heart sounds. Exam reveals no gallop and no friction rub.  No murmur heard. Pulmonary/Chest: Effort normal and breath sounds normal. No respiratory distress.  Abdominal: Soft. She exhibits no distension. There is no tenderness.  Musculoskeletal: She exhibits no edema or tenderness.  Neurological: She is alert.    Skin: Skin is warm and dry.  Psychiatric: She has a normal mood and affect. Her behavior is normal. Thought content normal.  Nursing note and vitals reviewed.    ED Treatments / Results  Labs (all labs ordered are listed, but only abnormal results are displayed) Labs Reviewed  CBC WITH DIFFERENTIAL/PLATELET - Abnormal; Notable for the following components:      Result Value   RBC 3.65 (*)    Hemoglobin 11.2 (*)    HCT 32.5 (*)    All other components within normal limits  BASIC METABOLIC PANEL - Abnormal; Notable for the following components:   Sodium 123 (*)  Potassium 3.1 (*)    Chloride 83 (*)    Glucose, Bld 108 (*)    GFR calc non Af Amer 57 (*)    All other components within normal limits  GASTROINTESTINAL PANEL BY PCR, STOOL (REPLACES STOOL CULTURE)  C DIFFICILE QUICK SCREEN W PCR REFLEX    EKG None  Radiology No results found.  Procedures Procedures (including critical care time)  Medications Ordered in ED Medications  loperamide (IMODIUM) capsule 4 mg (4 mg Oral Given 07/02/18 0909)  sodium chloride 0.9 % bolus 500 mL (0 mLs Intravenous Stopped 07/02/18 1002)  morphine 2 MG/ML injection 2 mg (2 mg Intravenous Given 07/02/18 0908)  potassium chloride SA (K-DUR,KLOR-CON) CR tablet 60 mEq (60 mEq Oral Given 07/02/18 1001)  ondansetron (ZOFRAN) injection 4 mg (4 mg Intravenous Given 07/02/18 1001)     Initial Impression / Assessment and Plan / ED Course  I have reviewed the triage vital signs and the nursing notes.  Pertinent labs & imaging results that were available during my care of the patient were reviewed by me and considered in my medical decision making (see chart for details).     77 year old female with diarrhea.  Initial plan was symptomatic treatment and sending stool studies.  Labs subsequently showing hypokalemia which is supplemented.  Also pretty significant hyponatremia with a sodium of 123.  She is on hydrochlorothiazide and also  Lexapro which may potentially be contributing.  She is not overtly encephalopathic but I do not feel quite comfortable discharging her with a sodium at this level.  She was given normal saline.  Will discuss with hospitalist service for admission.  Final Clinical Impressions(s) / ED Diagnoses   Final diagnoses:  Diarrhea, unspecified type  Hyponatremia    ED Discharge Orders    None       Virgel Manifold, MD 07/03/18 [phone]

## 2018-07-02 NOTE — ED Notes (Signed)
ED Provider at bedside. 

## 2018-07-02 NOTE — ED Notes (Signed)
ED TO INPATIENT HANDOFF REPORT  Name/Age/Gender Monica Faulkner 77 y.o. female  Code Status   Home/SNF/Other Home  Chief Complaint diarrhea   Level of Care/Admitting Diagnosis ED Disposition    ED Disposition Condition [facility] Area: [facility] [100102]  Level of Care: Med-Surg [16]  Diagnosis: Hyponatremia [967893]  Admitting Physician: Hosie Poisson [[id number]]  Attending Physician: Hosie Poisson [[id number]]  PT Class (Do Not Modify): Observation [104]  PT Acc Code (Do Not Modify): Observation [10022]       Medical History Past Medical History:  Diagnosis Date  . Anxiety    seen at [facility], for extreme anxiety. As a result of her anxiety  she had ECHO  . Cancer ([place])    basal cell   . Complication of anesthesia    atropine increased BP & increased pulse  . Depression   . Diabetes mellitus    type 2- 2006, managed with diet  . GERD (gastroesophageal reflux disease)    rare occurence  . H/O hiatal hernia   . Headache(784.0)    caused by sinus congestion fr. time to time   . Hypertension     Allergies Allergies  Allergen Reactions  . Avapro [Irbesartan] Other (See Comments)    Difficulty breathing, feeling like she was going to pass out  . Calcium Channel Blockers     ineffective  . Decongestant [Pseudoephedrine] Other (See Comments)    Heart races , blood pressure goes up  . Norvasc [Amlodipine Besylate] Itching, Other (See Comments) and Hypertension    Turned skin red  . Sulfa Antibiotics Other (See Comments)    "Made kidney infection worse"    IV Location/Drains/Wounds Patient Lines/Drains/Airways Status   Active Line/Drains/Airways    Name:   Placement date:   Placement time:   Site:   Days:   Peripheral IV 07/02/18 Right Antecubital   07/02/18    0907    Antecubital   less than 1   Incision 03/18/12 Eye Left   03/18/12    1257     2297          Labs/Imaging Results for orders placed or performed during the  hospital encounter of 07/02/18 (from the past 48 hour(s))  CBC with Differential     Status: Abnormal   Collection Time: 07/02/18  8:52 AM  Result Value Ref Range   WBC 5.6 4.0 - 10.5 K/uL   RBC 3.65 (L) 3.87 - 5.11 MIL/uL   Hemoglobin 11.2 (L) 12.0 - 15.0 g/dL   HCT 32.5 (L) 36.0 - 46.0 %   MCV 89.0 80.0 - 100.0 fL   MCH 30.7 26.0 - 34.0 pg   MCHC 34.5 30.0 - 36.0 g/dL   RDW 11.6 11.5 - 15.5 %   Platelets 226 150 - 400 K/uL   nRBC 0.0 0.0 - 0.2 %   Neutrophils Relative % 69 %   Neutro Abs 3.9 1.7 - 7.7 K/uL   Lymphocytes Relative 15 %   Lymphs Abs 0.9 0.7 - 4.0 K/uL   Monocytes Relative 14 %   Monocytes Absolute 0.8 0.1 - 1.0 K/uL   Eosinophils Relative 2 %   Eosinophils Absolute 0.1 0.0 - 0.5 K/uL   Basophils Relative 0 %   Basophils Absolute 0.0 0.0 - 0.1 K/uL   Immature Granulocytes 0 %   Abs Immature Granulocytes 0.02 0.00 - 0.07 K/uL    Comment: Performed at [facility], [address] [place],  [place] [address]  Basic metabolic panel     Status: Abnormal   Collection Time: 07/02/18  8:52 AM  Result Value Ref Range   Sodium 123 (L) 135 - 145 mmol/L   Potassium 3.1 (L) 3.5 - 5.1 mmol/L   Chloride 83 (L) 98 - 111 mmol/L   CO2 29 22 - 32 mmol/L   Glucose, Bld 108 (H) 70 - 99 mg/dL   BUN 22 8 - 23 mg/dL   Creatinine, Ser 0.95 0.44 - 1.00 mg/dL   Calcium 9.2 8.9 - 10.3 mg/dL   GFR calc non Af Amer 57 (L) >60 mL/min   GFR calc Af Amer >60 >60 mL/min    Comment: (NOTE) The eGFR has been calculated using the CKD EPI equation. This calculation has not been validated in all clinical situations. eGFR's persistently <60 mL/min signify possible Chronic Kidney Disease.    Anion gap 11 5 - 15    Comment: Performed at [facility], [place] [address] [place], [place] [address]   No results found. None  Pending Labs Unresulted Labs (From admission, onward)    Start     Ordered   07/02/18 0853  C difficile quick scan w PCR reflex  (C  Difficile quick screen w PCR reflex panel)  Once, for 24 hours,   R     07/02/18 0852   07/02/18 0852  Gastrointestinal Panel by PCR , Stool  (Gastrointestinal Panel by PCR, Stool)  Once,   R     07/02/18 0852          Vitals/Pain Today's Vitals   07/02/18 1000 07/02/18 1002 07/02/18 1130 07/02/18 1200  BP: (!) 123/49  (!) 122/50 (!) 109/50  Pulse: (!) 52  (!) 51 (!) 51  Resp: _0 Temp:      TempSrc:      SpO2: 93%  98% 96%  PainSc:  0-No pain      Isolation Precautions Enteric precautions (UV disinfection)  Medications Medications  loperamide (IMODIUM) capsule 4 mg (4 mg Oral Given 07/02/18 0909)  sodium chloride 0.9 % bolus 500 mL (0 mLs Intravenous Stopped 07/02/18 1002)  morphine 2 MG/ML injection 2 mg (2 mg Intravenous Given 07/02/18 0908)  potassium chloride SA (K-DUR,KLOR-CON) CR tablet 60 mEq (60 mEq Oral Given 07/02/18 1001)  ondansetron (ZOFRAN) injection 4 mg (4 mg Intravenous Given 07/02/18 1001)    Mobility walks with person assist

## 2018-07-02 NOTE — H&P (Signed)
History and Physical    Monica Faulkner ZOX:[id number] DOB: August 15, 1941 DOA: 07/02/2018  PCP: [facility] Patient coming from: Home.   I have personally briefly reviewed patient's old medical records in [place]  Chief Complaint: nausea and diarrhea for 2 to 3 days.   HPI: Monica Faulkner is a 77 y.o. female with medical history significant of basal cell cancer, DM, diet controlled, hypertension, recent h/o of E coli infection and diarrhea presents again today to ED with similar complaints of nausea or watery diarrhea.  She denies any fever chills, headaches, chest pain, shortness of breath, lower extremity weakness, syncope.  She reports dizziness and feeling exhausted.  She denies any syncope any urinary symptoms her bloody diarrhea or hematemesis.  She reports she completed course of antibiotics last time she had E. coli infection.  Patient reports mild abdominal discomfort in the lower quadrant during diarrhea.  ED Course: About the ED she was afebrile blood pressure is 109/50 and lab work revealed 123 potassium 3.1, creatinine 0.95 and hemoglobin of 11.2 Hospitalist service for admission for hyponatremia, dizziness and dehydration and further evaluation of watery diarrhea. Review of Systems: As per HPI otherwise 10 point review of systems negative.  "All others unremarkable,"   Past Medical History:  Diagnosis Date  . Anxiety    seen at [facility], for extreme anxiety. As a result of her anxiety  she had ECHO  . Cancer ([place])    basal cell   . Complication of anesthesia    atropine increased BP & increased pulse  . Depression   . Diabetes mellitus    type 2- 2006, managed with diet  . GERD (gastroesophageal reflux disease)    rare occurence  . H/O hiatal hernia   . Headache(784.0)    caused by sinus congestion fr. time to time   . Hypertension     Past Surgical History:  Procedure Laterality Date  . ABDOMINAL HYSTERECTOMY     1991-scar later had to  be further repaired   . APPENDECTOMY     L ovarian cyst - ruptured & appendectomy done at same time.   Marland Kitchen EYE SURGERY     R eye- macular hole repair- 2008  . GAS INSERTION  03/18/2012   Procedure: INSERTION OF GAS;  Surgeon: Hayden Pedro, MD;  Location: [facility];  Service: Ophthalmology;  Laterality: Left;  . HERNIA REPAIR     1974- inguinal repair  . PARS PLANA VITRECTOMY  03/18/2012   Procedure: PARS PLANA VITRECTOMY WITH 25 GAUGE;  Surgeon: Hayden Pedro, MD;  Location: [facility];  Service: Ophthalmology;  Laterality: Left;  25 GAUGE PPV FOR MACULAR HOLE  . TEAR DUCT PROBING     as an infant  . TONSILLECTOMY     and adenioidectomy- as a child  . VITRECTOMY  03/18/2012     reports that she has never smoked. She has never used smokeless tobacco. She reports that she does not drink alcohol or use drugs.  Allergies  Allergen Reactions  . Avapro [Irbesartan] Other (See Comments)    Difficulty breathing, feeling like she was going to pass out  . Calcium Channel Blockers     ineffective  . Decongestant [Pseudoephedrine] Other (See Comments)    Heart races , blood pressure goes up  . Norvasc [Amlodipine Besylate] Itching, Other (See Comments) and Hypertension    Turned skin red  . Sulfa Antibiotics Other (See Comments)    "Made kidney infection worse"  No family of heart disease or GI disease.  Prior to Admission medications   Medication Sig Start Date End Date Taking? Authorizing Provider  atenolol (TENORMIN) 25 MG tablet Take 25 mg by mouth every evening.   Yes [provider]  cholecalciferol (VITAMIN D) 1000 units tablet Take 1,000 Units by mouth daily.   Yes [provider]  clonazePAM (KLONOPIN) 0.5 MG tablet Take 1 mg by mouth at bedtime. May repeat one tablet as needed for sleep 04/23/18  Yes [provider]  cloNIDine (CATAPRES) 0.2 MG tablet Take 0.2 mg by mouth at bedtime.  04/23/18  Yes [provider]  diphenhydrAMINE (BENADRYL) 25 mg  capsule Take 25 mg by mouth at bedtime as needed for allergies.    Yes [provider]  doxepin (SINEQUAN) 25 MG capsule Take 25 mg by mouth at bedtime as needed (sleep).  03/24/18  Yes [provider]  escitalopram (LEXAPRO) 10 MG tablet Take 10 mg by mouth every morning.   Yes [provider]  ibuprofen (ADVIL,MOTRIN) 200 MG tablet Take 200 mg by mouth daily as needed for fever or mild pain.    Yes [provider]  JANUVIA 100 MG tablet Take 100 mg by mouth daily. 04/01/18  Yes [provider]  lisinopril-hydrochlorothiazide (PRINZIDE,ZESTORETIC) 20-25 MG tablet Take 1 tablet by mouth daily. 04/22/18  Yes [provider]  Loperamide HCl (IMODIUM A-D PO) Take 2 tablets by mouth every hour as needed (diarrhea).   Yes [provider]  naphazoline-pheniramine (NAPHCON-A) 0.025-0.3 % ophthalmic solution Place 1 drop into both eyes as needed for eye irritation (dry eyes).   Yes [provider]  pravastatin (PRAVACHOL) 20 MG tablet Take 20 mg by mouth every other day.  02/24/18  Yes [provider]  zolpidem (AMBIEN) 5 MG tablet Take 5 mg by mouth at bedtime as needed for sleep. 06/14/18  Yes [provider]  ciprofloxacin (CIPRO) 500 MG tablet Take 1 tablet (500 mg total) by mouth 2 (two) times daily. One po bid x 7 days Patient not taking: Reported on 07/02/2018 04/29/18   Drenda Freeze, MD  metroNIDAZOLE (FLAGYL) 500 MG tablet Take 1 tablet (500 mg total) by mouth 2 (two) times daily. One po bid x 7 days Patient not taking: Reported on 07/02/2018 04/29/18   Drenda Freeze, MD  ondansetron (ZOFRAN) 8 MG tablet Take 1 tablet (8 mg total) by mouth every 8 (eight) hours as needed for nausea or vomiting. Patient not taking: Reported on 07/02/2018 04/28/18   Daleen Bo, MD    Physical Exam: Vitals:   07/02/18 1000 07/02/18 1130 07/02/18 1200 07/02/18 1229  BP: (!) 123/49 (!) 122/50 (!) 109/50 130/65  Pulse:  (!) 52 (!) 51 (!) 51 (!) 56  Resp: 15 14 15 16   Temp:    98.4 F (36.9 C)  TempSrc:    Oral  SpO2: 93% 98% 96% 98%    Constitutional: NAD, calm, comfortable Vitals:   07/02/18 1000 07/02/18 1130 07/02/18 1200 07/02/18 1229  BP: (!) 123/49 (!) 122/50 (!) 109/50 130/65  Pulse: (!) 52 (!) 51 (!) 51 (!) 56  Resp: 15 14 15 16   Temp:    98.4 F (36.9 C)  TempSrc:    Oral  SpO2: 93% 98% 96% 98%   Eyes: PERRL, lids and conjunctivae normal ENMT: Mucous membranes are moist. Posterior pharynx clear of any exudate or lesions.Normal dentition.  Neck: normal, supple, no masses, no thyromegaly Respiratory: clear to auscultation  bilaterally, no wheezing, no crackles. Normal respiratory effort. No accessory muscle use.  Cardiovascular: Regular rate and rhythm, no murmurs / rubs / gallops. No extremity edema. 2+ pedal pulses. No carotid bruits.  Abdomen: no tenderness, no masses palpated. No hepatosplenomegaly. Bowel sounds positive.  Musculoskeletal: no clubbing / cyanosis. No joint deformity upper and lower extremities. Good ROM, no contractures. Normal muscle tone.  Skin: no rashes, lesions, ulcers. No induration Neurologic: CN 2-12 grossly intact. Sensation intact, DTR normal. Strength 5/5 in all 4.  Psychiatric: Normal judgment and insight. Alert and oriented x 3. Normal mood.     Labs on Admission: I have personally reviewed following labs and imaging studies  CBC: Recent Labs  Lab 07/02/18 0852  WBC 5.6  NEUTROABS 3.9  HGB 11.2*  HCT 32.5*  MCV 89.0  PLT 132   Basic Metabolic Panel: Recent Labs  Lab 07/02/18 0852  NA 123*  K 3.1*  CL 83*  CO2 29  GLUCOSE 108*  BUN 22  CREATININE 0.95  CALCIUM 9.2   GFR: CrCl cannot be calculated (Unknown ideal weight.). Liver Function Tests: No results for input(s): AST, ALT, ALKPHOS, BILITOT, PROT, ALBUMIN in the last 168 hours. No results for input(s): LIPASE, AMYLASE in the last 168 hours. No results for input(s): AMMONIA in the  last 168 hours. Coagulation Profile: No results for input(s): INR, PROTIME in the last 168 hours. Cardiac Enzymes: No results for input(s): CKTOTAL, CKMB, CKMBINDEX, TROPONINI in the last 168 hours. BNP (last 3 results) No results for input(s): PROBNP in the last 8760 hours. HbA1C: No results for input(s): HGBA1C in the last 72 hours. CBG: No results for input(s): GLUCAP in the last 168 hours. Lipid Profile: No results for input(s): CHOL, HDL, LDLCALC, TRIG, CHOLHDL, LDLDIRECT in the last 72 hours. Thyroid Function Tests: No results for input(s): TSH, T4TOTAL, FREET4, T3FREE, THYROIDAB in the last 72 hours. Anemia Panel: No results for input(s): VITAMINB12, FOLATE, FERRITIN, TIBC, IRON, RETICCTPCT in the last 72 hours. Urine analysis:    Component Value Date/Time   COLORURINE YELLOW 04/29/2018 1220   APPEARANCEUR CLEAR 04/29/2018 1220   LABSPEC 1.004 (L) 04/29/2018 1220   PHURINE 6.0 04/29/2018 1220   GLUCOSEU NEGATIVE 04/29/2018 1220   HGBUR SMALL (A) 04/29/2018 1220   BILIRUBINUR NEGATIVE 04/29/2018 1220   KETONESUR NEGATIVE 04/29/2018 1220   PROTEINUR NEGATIVE 04/29/2018 1220   UROBILINOGEN 0.2 12/11/2013 1600   NITRITE NEGATIVE 04/29/2018 1220   LEUKOCYTESUR TRACE (A) 04/29/2018 1220    Radiological Exams on Admission: No results found.  EKG:  Not done.   Assessment/Plan Active Problems:   Hyponatremia Acute hyponatremia probably secondary to dehydration from watery diarrhea and concomitant hydrochlorothiazide use. Stop HCTZ use and hydrate with normal saline at 75 mL/h.  Repeat BMP tomorrow.    Realized weakness and dizziness probably from dehydration and hyponatremia Hydrate and repeat BMP.  Get PT evaluation.   Nausea no vomiting and watery diarrhea since 2 days Patient reports mild abdominal discomfort in the lower quadrant.  Please get C. difficile PCR and GI pathogen PCR. Start the patient on clear liquid diet    Hypertension well controlled hold  HCTZ and ARB.  Resume clonidine.    Diet controlled diabetes mellitus.  DVT prophylaxis: Lovenox Code Status full code Family Communication: none at bedside.  Disposition Plan: pending clinical improvement.  Consults called: none Admission status:  Obs/ med surg   Hosie Poisson MD [facility] Pager [phone]  If 7PM-7AM, please contact night-coverage [email or web address]  Password [email or web address]  07/02/2018, 12:54 PM

## 2018-07-03 DIAGNOSIS — E871 Hypo-osmolality and hyponatremia: Principal | ICD-10-CM

## 2018-07-03 DIAGNOSIS — E876 Hypokalemia: Secondary | ICD-10-CM

## 2018-07-03 DIAGNOSIS — K529 Noninfective gastroenteritis and colitis, unspecified: Secondary | ICD-10-CM

## 2018-07-03 LAB — BASIC METABOLIC PANEL
ANION GAP: 7 (ref 5–15)
BUN: 16 mg/dL (ref 8–23)
CO2: 27 mmol/L (ref 22–32)
Calcium: 9 mg/dL (ref 8.9–10.3)
Chloride: 94 mmol/L — ABNORMAL LOW (ref 98–111)
Creatinine, Ser: 0.93 mg/dL (ref 0.44–1.00)
GFR, EST NON AFRICAN AMERICAN: 58 mL/min — AB (ref >60)
Glucose, Bld: 102 mg/dL — ABNORMAL HIGH (ref 70–99)
POTASSIUM: 4.1 mmol/L (ref 3.5–5.1)
SODIUM: 128 mmol/L — AB (ref 135–145)

## 2018-07-03 LAB — CBC
HCT: 31.3 % — ABNORMAL LOW (ref 36.0–46.0)
Hemoglobin: 10.4 g/dL — ABNORMAL LOW (ref 12.0–15.0)
MCH: 31.5 pg (ref 26.0–34.0)
MCHC: 33.2 g/dL (ref 30.0–36.0)
MCV: 94.8 fL (ref 80.0–100.0)
NRBC: 0 % (ref 0.0–0.2)
Platelets: 186 10*3/uL (ref 150–400)
RBC: 3.3 MIL/uL — AB (ref 3.87–5.11)
RDW: 11.9 % (ref 11.5–15.5)
WBC: 4.8 10*3/uL (ref 4.0–10.5)

## 2018-07-03 MED ORDER — LIP MEDEX EX OINT
TOPICAL_OINTMENT | CUTANEOUS | Status: AC
  Administered 2018-07-03: 14:00:00
  Filled 2018-07-03: qty 7

## 2018-07-03 MED ORDER — IBUPROFEN 200 MG PO TABS
400.0000 mg | ORAL_TABLET | Freq: Once | ORAL | Status: AC
  Administered 2018-07-03: 400 mg via ORAL
  Filled 2018-07-03: qty 2

## 2018-07-03 MED ORDER — ATENOLOL 25 MG PO TABS
25.0000 mg | ORAL_TABLET | Freq: Every day | ORAL | Status: DC
  Administered 2018-07-03 – 2018-07-05 (×3): 25 mg via ORAL
  Filled 2018-07-03 (×3): qty 1

## 2018-07-03 NOTE — Evaluation (Signed)
Physical Therapy Evaluation Patient Details Name: Monica Faulkner MRN: [id number] DOB: 05-01-1941 Today's Date: 07/03/2018   History of Present Illness  Pt admitted with hyponatremia and dehydration 2* nausea and diarrhea.  Pt with hx of DM and anxiety/depression  Clinical Impression  Pt admitted as above and presenting with functional mobility limitations 2* generalized weakness and balance deficits.  Pt would benefit from follow up HHPT to further address deficits.  Pt would also benefit from use of RW for home use but states she has a specific model in mind and will have a friend take her to pick it.    Follow Up Recommendations Home health PT    Equipment Recommendations  None recommended by PT    Recommendations for Other Services       Precautions / Restrictions Precautions Precautions: Fall Restrictions Weight Bearing Restrictions: No      Mobility  Bed Mobility Overal bed mobility: Modified Independent             General bed mobility comments: Pt unassisted to EOB sitting  Transfers Overall transfer level: Needs assistance Equipment used: Rolling walker (2 wheeled) Transfers: Sit to/from Stand Sit to Stand: Min guard         General transfer comment: steady assist  Ambulation/Gait Ambulation/Gait assistance: Min assist;Min guard Gait Distance (Feet): 400 Feet Assistive device: Rolling walker (2 wheeled);None Gait Pattern/deviations: Step-through pattern;Decreased step length - right;Decreased step length - left;Shuffle;Trunk flexed     General Gait Details: Pt unsteady sans AD and requiring min assist to balance - marked improvement with use of RW and cues for posture and position from [employer]            Wheelchair Mobility    Modified Rankin (Stroke Patients Only)       Balance Overall balance assessment: Needs assistance Sitting-balance support: Feet supported;No upper extremity supported Sitting balance-Leahy Scale: Good     Standing balance support: Bilateral upper extremity supported Standing balance-Leahy Scale: Fair                               Pertinent Vitals/Pain Pain Assessment: No/denies pain    Home Living Family/patient expects to be discharged to:: Private residence Living Arrangements: Alone Available Help at Discharge: Family Type of Home: House Home Access: Stairs to enter Entrance Stairs-Rails: Right Entrance Stairs-Number of Steps: 3 Home Layout: One level Home Equipment: Cane - single point      Prior Function Level of Independence: Independent with assistive device(s)         Comments: Pt uses cane, shopping carts and furniture to stabilize     Hand Dominance        Extremity/Trunk Assessment   Upper Extremity Assessment Upper Extremity Assessment: Generalized weakness    Lower Extremity Assessment Lower Extremity Assessment: Generalized weakness    Cervical / Trunk Assessment Cervical / Trunk Assessment: Kyphotic  Communication   Communication: No difficulties  Cognition Arousal/Alertness: Awake/alert Behavior During Therapy: WFL for tasks assessed/performed Overall Cognitive Status: Within Functional Limits for tasks assessed                                        General Comments      Exercises     Assessment/Plan    PT Assessment Patient needs continued PT services  PT Problem List Decreased strength;Decreased  range of motion;Decreased activity tolerance;Decreased mobility;Pain;Decreased balance;Decreased knowledge of use of DME;Obesity       PT Treatment Interventions DME instruction;Gait training;Stair training;Functional mobility training;Therapeutic activities;Therapeutic exercise;Patient/family education    PT Goals (Current goals can be found in the Care Plan section)  Acute Rehab PT Goals Patient Stated Goal: HOME PT Goal Formulation: With patient Time For Goal Achievement: 07/17/18 Potential to Achieve  Goals: Good    Frequency Min 3X/week   Barriers to discharge Decreased caregiver support home alone    Co-evaluation               AM-PAC PT "6 Clicks" Daily Activity  Outcome Measure Difficulty turning over in bed (including adjusting bedclothes, sheets and blankets)?: None Difficulty moving from lying on back to sitting on the side of the bed? : A Little Difficulty sitting down on and standing up from a chair with arms (e.g., wheelchair, bedside commode, etc,.)?: A Lot Help needed moving to and from a bed to chair (including a wheelchair)?: A Little Help needed walking in hospital room?: A Little Help needed climbing 3-5 steps with a railing? : A Little 6 Click Score: 18    End of Session Equipment Utilized During Treatment: Gait belt Activity Tolerance: Patient tolerated treatment well Patient left: in chair;with call bell/phone within reach Nurse Communication: Mobility status PT Visit Diagnosis: Unsteadiness on feet (R26.81);Muscle weakness (generalized) (M62.81);Difficulty in walking, not elsewhere classified (R26.2)    Time: 7681-1572 PT Time Calculation (min) (ACUTE ONLY): 22 min   Charges:   PT Evaluation $PT Eval Low Complexity: 1 Low          Debe Coder PT Acute Rehabilitation Services Pager [phone] Office [phone]   Jary Louvier 07/03/2018, 3:25 PM

## 2018-07-03 NOTE — Progress Notes (Signed)
PROGRESS NOTE  Monica Faulkner UEK:[id number] DOB: 1940-08-19 DOA: 07/02/2018 PCP: Merrilee Seashore, MD  HPI/Recap of past 24 hours: HPI from Dr Karleen Hampshire Seila [place]hower is a 77 y.o. female with medical history significant for DM diet controlled, hypertension, GERD, depression, recent h/o of E coli infection and diarrhea presents to ED with similar complaints of nausea, watery non-bloody diarrhea and generalized weakness for the past 3 days. Reports some associated mild intermittent lower quadrant abdominal pain. Pt denies any fever/chills, vomiting, chest pain.  Of note, patient reported taking Imodium without any relief.  In the ED, lab work showed sodium of 123, hypokalemia.  Patient was given Imodium.  Patient admitted for further management.   Today, patient reported feeling much better, denies any further diarrhea, nausea, abdominal pain, chest pain, fever/chills.  Only reports generalized weakness.  She is very eager to eat solid food.  Assessment/Plan: Active Problems:   Hyponatremia  Gastroenteritis/colitis ?viral Vs ?IBS Afebrile, no leukocytosis Gastrointestinal panel, C. difficile pending collection as diarrhea has resolved No imaging done on admission.  Due to resolved symptoms will monitor for now Continue with IV fluids Advance diet No indication for any antibiotics for now Monitor closely  Hyponatremia/hypokalemia Likely due to above, in addition to hydrochlorothiazide use Continue IV fluids Replace PRN Daily BMP  Hypertension Stable Continue home meds except hydrochlorothiazide  Diet controlled diabetes mellitus Carb modified diet  Hyperlipidemia Continue statins    Code Status: Full  Family Communication: None at bedside  Disposition Plan: If remains stable, plan to DC 07/04/2018   Consultants:  None  Procedures:  None  Antimicrobials:  None  DVT prophylaxis: Lovenox   Objective: Vitals:   07/02/18 1229 07/02/18 1917  07/03/18 0400 07/03/18 1244  BP: 130/65 (!) 144/45 (!) 143/62 (!) 145/61  Pulse: (!) 56 (!) 57 67 69  Resp: 16 14 16 16   Temp: 98.4 F (36.9 C) 97.9 F (36.6 C) 98.3 F (36.8 C) 100 F (37.8 C)  TempSrc: Oral Oral Oral Oral  SpO2: 98% 97% 98% 95%    Intake/Output Summary (Last 24 hours) at 07/03/2018 1510 Last data filed at 07/03/2018 1506 Gross per 24 hour  Intake 2358.73 ml  Output -  Net 2358.73 ml   There were no vitals filed for this visit.  Exam:   General: NAD  Cardiovascular: S1, S2 present  Respiratory: CTA B  Abdomen: Soft, nontender, nondistended, bowel sounds present  Musculoskeletal: No pedal edema bilaterally  Skin: Normal  Psychiatry: Normal mood   Data Reviewed: CBC: Recent Labs  Lab 07/02/18 0852 07/03/18 0452  WBC 5.6 4.8  NEUTROABS 3.9  --   HGB 11.2* 10.4*  HCT 32.5* 31.3*  MCV 89.0 94.8  PLT 226 150   Basic Metabolic Panel: Recent Labs  Lab 07/02/18 0852 07/03/18 0452  NA 123* 128*  K 3.1* 4.1  CL 83* 94*  CO2 29 27  GLUCOSE 108* 102*  BUN 22 16  CREATININE 0.95 0.93  CALCIUM 9.2 9.0  MG 1.4*  --    GFR: CrCl cannot be calculated (Unknown ideal weight.). Liver Function Tests: No results for input(s): AST, ALT, ALKPHOS, BILITOT, PROT, ALBUMIN in the last 168 hours. No results for input(s): LIPASE, AMYLASE in the last 168 hours. No results for input(s): AMMONIA in the last 168 hours. Coagulation Profile: No results for input(s): INR, PROTIME in the last 168 hours. Cardiac Enzymes: No results for input(s): CKTOTAL, CKMB, CKMBINDEX, TROPONINI in the last 168 hours. BNP (last 3 results)  No results for input(s): PROBNP in the last 8760 hours. HbA1C: No results for input(s): HGBA1C in the last 72 hours. CBG: No results for input(s): GLUCAP in the last 168 hours. Lipid Profile: No results for input(s): CHOL, HDL, LDLCALC, TRIG, CHOLHDL, LDLDIRECT in the last 72 hours. Thyroid Function Tests: No results for input(s):  TSH, T4TOTAL, FREET4, T3FREE, THYROIDAB in the last 72 hours. Anemia Panel: No results for input(s): VITAMINB12, FOLATE, FERRITIN, TIBC, IRON, RETICCTPCT in the last 72 hours. Urine analysis:    Component Value Date/Time   COLORURINE YELLOW 04/29/2018 1220   APPEARANCEUR CLEAR 04/29/2018 1220   LABSPEC 1.004 (L) 04/29/2018 1220   PHURINE 6.0 04/29/2018 1220   GLUCOSEU NEGATIVE 04/29/2018 1220   HGBUR SMALL (A) 04/29/2018 1220   BILIRUBINUR NEGATIVE 04/29/2018 1220   KETONESUR NEGATIVE 04/29/2018 1220   PROTEINUR NEGATIVE 04/29/2018 1220   UROBILINOGEN 0.2 12/11/2013 1600   NITRITE NEGATIVE 04/29/2018 1220   LEUKOCYTESUR TRACE (A) 04/29/2018 1220   Sepsis Labs: @LABRCNTIP (procalcitonin:4,lacticidven:4)  )No results found for this or any previous visit (from the past 240 hour(s)).    Studies: No results found.  Scheduled Meds: . clonazePAM  1 mg Oral QHS  . cloNIDine  0.2 mg Oral QHS  . enoxaparin (LOVENOX) injection  40 mg Subcutaneous Q24H  . lip balm      . pravastatin  20 mg Oral QODAY    Continuous Infusions: . sodium chloride    . sodium chloride 75 mL/hr at 07/03/18 1040     LOS: 0 days     Alma Friendly, MD [facility]   If 7PM-7AM, please contact night-coverage [email or web address] 07/03/2018, 3:10 PM

## 2018-07-04 DIAGNOSIS — I10 Essential (primary) hypertension: Secondary | ICD-10-CM

## 2018-07-04 DIAGNOSIS — E871 Hypo-osmolality and hyponatremia: Secondary | ICD-10-CM | POA: Diagnosis not present

## 2018-07-04 DIAGNOSIS — A09 Infectious gastroenteritis and colitis, unspecified: Secondary | ICD-10-CM

## 2018-07-04 LAB — CBC WITH DIFFERENTIAL/PLATELET
ABS IMMATURE GRANULOCYTES: 0.01 10*3/uL (ref 0.00–0.07)
BASOS ABS: 0 10*3/uL (ref 0.0–0.1)
Basophils Relative: 0 %
EOS PCT: 2 %
Eosinophils Absolute: 0.1 10*3/uL (ref 0.0–0.5)
HEMATOCRIT: 33.5 % — AB (ref 36.0–46.0)
Hemoglobin: 11.2 g/dL — ABNORMAL LOW (ref 12.0–15.0)
Immature Granulocytes: 0 %
LYMPHS ABS: 0.8 10*3/uL (ref 0.7–4.0)
LYMPHS PCT: 14 %
MCH: 31 pg (ref 26.0–34.0)
MCHC: 33.4 g/dL (ref 30.0–36.0)
MCV: 92.8 fL (ref 80.0–100.0)
Monocytes Absolute: 0.9 10*3/uL (ref 0.1–1.0)
Monocytes Relative: 16 %
NEUTROS ABS: 3.9 10*3/uL (ref 1.7–7.7)
NRBC: 0 % (ref 0.0–0.2)
Neutrophils Relative %: 68 %
Platelets: 247 10*3/uL (ref 150–400)
RBC: 3.61 MIL/uL — ABNORMAL LOW (ref 3.87–5.11)
RDW: 11.9 % (ref 11.5–15.5)
WBC: 5.7 10*3/uL (ref 4.0–10.5)

## 2018-07-04 LAB — BASIC METABOLIC PANEL
ANION GAP: 9 (ref 5–15)
BUN: 12 mg/dL (ref 8–23)
CO2: 25 mmol/L (ref 22–32)
Calcium: 9.1 mg/dL (ref 8.9–10.3)
Chloride: 96 mmol/L — ABNORMAL LOW (ref 98–111)
Creatinine, Ser: 0.7 mg/dL (ref 0.44–1.00)
GFR calc non Af Amer: >60 mL/min (ref >60)
Glucose, Bld: 119 mg/dL — ABNORMAL HIGH (ref 70–99)
Potassium: 3.7 mmol/L (ref 3.5–5.1)
Sodium: 130 mmol/L — ABNORMAL LOW (ref 135–145)

## 2018-07-04 LAB — GASTROINTESTINAL PANEL BY PCR, STOOL (REPLACES STOOL CULTURE)

## 2018-07-04 LAB — C DIFFICILE QUICK SCREEN W PCR REFLEX
C Diff antigen: NEGATIVE
C Diff interpretation: NOT DETECTED
C Diff toxin: NEGATIVE

## 2018-07-04 MED ORDER — CIPROFLOXACIN IN D5W 400 MG/200ML IV SOLN
400.0000 mg | Freq: Two times a day (BID) | INTRAVENOUS | Status: DC
  Administered 2018-07-04 – 2018-07-06 (×3): 400 mg via INTRAVENOUS
  Filled 2018-07-04 (×3): qty 200

## 2018-07-04 MED ORDER — LOPERAMIDE HCL 2 MG PO CAPS
2.0000 mg | ORAL_CAPSULE | Freq: Once | ORAL | Status: AC
  Administered 2018-07-04: 2 mg via ORAL
  Filled 2018-07-04: qty 1

## 2018-07-04 MED ORDER — ALUM & MAG HYDROXIDE-SIMETH 200-200-20 MG/5ML PO SUSP
30.0000 mL | Freq: Once | ORAL | Status: AC
  Administered 2018-07-04: 30 mL via ORAL
  Filled 2018-07-04: qty 30

## 2018-07-04 NOTE — Progress Notes (Signed)
PROGRESS NOTE  Netty Sullivant JSH:[id number] DOB: [birth year] DOA: 07/02/2018 PCP: Merrilee Seashore, MD  HPI/Recap of past 24 hours: HPI from Dr Karleen Hampshire Larae Lauriana Denes is a 77 y.o. female with medical history significant for DM diet controlled, hypertension, GERD, depression, recent h/o of E coli infection and diarrhea presents to ED with similar complaints of nausea, watery non-bloody diarrhea and generalized weakness for the past 3 days. Reports some associated mild intermittent lower quadrant abdominal pain. Pt denies any fever/chills, vomiting, chest pain.  Of note, patient reported taking Imodium without any relief.  In the ED, lab work showed sodium of 123, hypokalemia.  Patient was given Imodium.  Patient admitted for further management.  Overnight, pt had multiple bouts of watery non-bloody diarrhea, with some nausea, generalized weakness. Today still with diarrhea, reports poor appetite. Denies any significant abdominal pain, fever/chills, vomiting, chest pain, SOB.  Assessment/Plan: Active Problems:   Hyponatremia  E.coli gastroenteritis/colitis Afebrile, no leukocytosis Gastrointestinal panel grew E.coli, C. Difficile negative Continue with IV fluids Full liquid diet Start ciprofloxacin x 5 days Monitor closely  Hyponatremia/hypokalemia Improving Likely due to above, in addition to hydrochlorothiazide use Continue IV fluids Replace PRN Daily BMP  Hypertension Stable Continue home meds except hydrochlorothiazide  Diet controlled diabetes mellitus Carb modified diet  Hyperlipidemia Continue statins    Code Status: Full  Family Communication: None at bedside  Disposition Plan: Home once stable   Consultants:  None  Procedures:  None  Antimicrobials:  Ciprofloxacin   DVT prophylaxis: Lovenox   Objective: Vitals:   07/03/18 1345 07/03/18 2015 07/04/18 0440 07/04/18 1349  BP:  (!) 154/61 138/78 (!) 160/69  Pulse:  64 74 72  Resp:  16  12 16   Temp: 99.1 F (37.3 C) 99.9 F (37.7 C) 98.8 F (37.1 C) 98.3 F (36.8 C)  TempSrc: Oral Oral Oral Oral  SpO2:  98% 98% 98%    Intake/Output Summary (Last 24 hours) at 07/04/2018 1530 Last data filed at 07/03/2018 1833 Gross per 24 hour  Intake 240 ml  Output -  Net 240 ml   There were no vitals filed for this visit.  Exam:  General: NAD   Cardiovascular: S1, S2 present  Respiratory: CTAB  Abdomen: Soft, nontender, nondistended, bowel sounds present  Musculoskeletal: No bilateral pedal edema noted  Skin: Normal  Psychiatry: Normal mood   Data Reviewed: CBC: Recent Labs  Lab 07/02/18 0852 07/03/18 0452 07/04/18 0530  WBC 5.6 4.8 5.7  NEUTROABS 3.9  --  3.9  HGB 11.2* 10.4* 11.2*  HCT 32.5* 31.3* 33.5*  MCV 89.0 94.8 92.8  PLT 226 186 850   Basic Metabolic Panel: Recent Labs  Lab 07/02/18 0852 07/03/18 0452 07/04/18 0530  NA 123* 128* 130*  K 3.1* 4.1 3.7  CL 83* 94* 96*  CO2 29 27 25   GLUCOSE 108* 102* 119*  BUN 22 16 12   CREATININE 0.95 0.93 0.70  CALCIUM 9.2 9.0 9.1  MG 1.4*  --   --    GFR: CrCl cannot be calculated (Unknown ideal weight.). Liver Function Tests: No results for input(s): AST, ALT, ALKPHOS, BILITOT, PROT, ALBUMIN in the last 168 hours. No results for input(s): LIPASE, AMYLASE in the last 168 hours. No results for input(s): AMMONIA in the last 168 hours. Coagulation Profile: No results for input(s): INR, PROTIME in the last 168 hours. Cardiac Enzymes: No results for input(s): CKTOTAL, CKMB, CKMBINDEX, TROPONINI in the last 168 hours. BNP (last 3 results) No results for input(s):  PROBNP in the last 8760 hours. HbA1C: No results for input(s): HGBA1C in the last 72 hours. CBG: No results for input(s): GLUCAP in the last 168 hours. Lipid Profile: No results for input(s): CHOL, HDL, LDLCALC, TRIG, CHOLHDL, LDLDIRECT in the last 72 hours. Thyroid Function Tests: No results for input(s): TSH, T4TOTAL, FREET4, T3FREE,  THYROIDAB in the last 72 hours. Anemia Panel: No results for input(s): VITAMINB12, FOLATE, FERRITIN, TIBC, IRON, RETICCTPCT in the last 72 hours. Urine analysis:    Component Value Date/Time   COLORURINE YELLOW 04/29/2018 1220   APPEARANCEUR CLEAR 04/29/2018 1220   LABSPEC 1.004 (L) 04/29/2018 1220   PHURINE 6.0 04/29/2018 1220   GLUCOSEU NEGATIVE 04/29/2018 1220   HGBUR SMALL (A) 04/29/2018 1220   BILIRUBINUR NEGATIVE 04/29/2018 1220   KETONESUR NEGATIVE 04/29/2018 1220   PROTEINUR NEGATIVE 04/29/2018 1220   UROBILINOGEN 0.2 12/11/2013 1600   NITRITE NEGATIVE 04/29/2018 1220   LEUKOCYTESUR TRACE (A) 04/29/2018 1220   Sepsis Labs: @LABRCNTIP (procalcitonin:4,lacticidven:4)  ) Recent Results (from the past 240 hour(s))  Gastrointestinal Panel by PCR , Stool     Status: Abnormal   Collection Time: 07/02/18  8:52 AM  Result Value Ref Range Status   Campylobacter species NOT DETECTED NOT DETECTED Final   Plesimonas shigelloides NOT DETECTED NOT DETECTED Final   Salmonella species NOT DETECTED NOT DETECTED Final   Yersinia enterocolitica NOT DETECTED NOT DETECTED Final   Vibrio species NOT DETECTED NOT DETECTED Final   Vibrio cholerae NOT DETECTED NOT DETECTED Final   Enteroaggregative E coli (EAEC) NOT DETECTED NOT DETECTED Final   Enteropathogenic E coli (EPEC) DETECTED (A) NOT DETECTED Final    Comment: RESULT CALLED TO, READ BACK BY AND VERIFIED WITH: CINDY HUGHY AT 1422 ON 07/04/18 BY SNJ    Enterotoxigenic E coli (ETEC) NOT DETECTED NOT DETECTED Final   Shiga like toxin producing E coli (STEC) NOT DETECTED NOT DETECTED Final   Shigella/Enteroinvasive E coli (EIEC) NOT DETECTED NOT DETECTED Final   Cryptosporidium NOT DETECTED NOT DETECTED Final   Cyclospora cayetanensis NOT DETECTED NOT DETECTED Final   Entamoeba histolytica NOT DETECTED NOT DETECTED Final   Giardia lamblia NOT DETECTED NOT DETECTED Final   Adenovirus F40/41 NOT DETECTED NOT DETECTED Final   Astrovirus  NOT DETECTED NOT DETECTED Final   Norovirus GI/GII NOT DETECTED NOT DETECTED Final   Rotavirus A NOT DETECTED NOT DETECTED Final   Sapovirus (I, II, IV, and V) NOT DETECTED NOT DETECTED Final    Comment: Performed at [facility], [address] [place], [place] [address]  C difficile quick scan w PCR reflex     Status: None   Collection Time: 07/02/18  8:53 AM  Result Value Ref Range Status   C Diff antigen NEGATIVE NEGATIVE Final   C Diff toxin NEGATIVE NEGATIVE Final   C Diff interpretation No C. difficile detected.  Final    Comment: Performed at [facility], [place] [address] [place], [place] [address]      Studies: No results found.  Scheduled Meds: . atenolol  25 mg Oral Daily  . clonazePAM  1 mg Oral QHS  . cloNIDine  0.2 mg Oral QHS  . enoxaparin (LOVENOX) injection  40 mg Subcutaneous Q24H  . pravastatin  20 mg Oral QODAY    Continuous Infusions: . sodium chloride 75 mL/hr at 07/04/18 0628     LOS: 0 days     Alma Friendly, MD [facility]   If 7PM-7AM, please contact night-coverage  [email or web address] 07/04/2018, 3:30 PM

## 2018-07-04 NOTE — Progress Notes (Signed)
Pharmacy Antibiotic Note  Monica Faulkner is a 77 y.o. female presented to the ED on 11/15 with c/o diarrhea. GI panel PCR on 11/15 positive for enteropathogenic Ecoli.  To start cipro for infection.  Today, 07/04/2018: - scr 0.70 (crcl~81)   Plan: - ciprofloxacin 400 mg IV q12h - Of note, typical LOT is three days for abx.  Consider d/c abx after three days if sx improves.  _______________________________________     Temp (24hrs), Avg:99 F (37.2 C), Min:98.3 F (36.8 C), Max:99.9 F (37.7 C)  Recent Labs  Lab 07/02/18 0852 07/03/18 0452 07/04/18 0530  WBC 5.6 4.8 5.7  CREATININE 0.95 0.93 0.70    CrCl cannot be calculated (Unknown ideal weight.).    Allergies  Allergen Reactions  . Avapro [Irbesartan] Other (See Comments)    Difficulty breathing, feeling like she was going to pass out  . Calcium Channel Blockers     ineffective  . Decongestant [Pseudoephedrine] Other (See Comments)    Heart races , blood pressure goes up  . Norvasc [Amlodipine Besylate] Itching, Other (See Comments) and Hypertension    Turned skin red  . Sulfa Antibiotics Other (See Comments)    "Made kidney infection worse"    Thank you for allowing pharmacy to be a part of this patient's care.  Lynelle Doctor 07/04/2018 3:34 PM

## 2018-07-05 DIAGNOSIS — I10 Essential (primary) hypertension: Secondary | ICD-10-CM | POA: Diagnosis present

## 2018-07-05 DIAGNOSIS — E785 Hyperlipidemia, unspecified: Secondary | ICD-10-CM | POA: Diagnosis present

## 2018-07-05 DIAGNOSIS — E876 Hypokalemia: Secondary | ICD-10-CM | POA: Diagnosis present

## 2018-07-05 DIAGNOSIS — Z7984 Long term (current) use of oral hypoglycemic drugs: Secondary | ICD-10-CM | POA: Diagnosis not present

## 2018-07-05 DIAGNOSIS — Z90710 Acquired absence of both cervix and uterus: Secondary | ICD-10-CM | POA: Diagnosis not present

## 2018-07-05 DIAGNOSIS — T502X5A Adverse effect of carbonic-anhydrase inhibitors, benzothiadiazides and other diuretics, initial encounter: Secondary | ICD-10-CM | POA: Diagnosis present

## 2018-07-05 DIAGNOSIS — Z79899 Other long term (current) drug therapy: Secondary | ICD-10-CM | POA: Diagnosis not present

## 2018-07-05 DIAGNOSIS — A044 Other intestinal Escherichia coli infections: Secondary | ICD-10-CM | POA: Diagnosis present

## 2018-07-05 DIAGNOSIS — E119 Type 2 diabetes mellitus without complications: Secondary | ICD-10-CM

## 2018-07-05 DIAGNOSIS — E871 Hypo-osmolality and hyponatremia: Secondary | ICD-10-CM | POA: Diagnosis present

## 2018-07-05 DIAGNOSIS — E860 Dehydration: Secondary | ICD-10-CM | POA: Diagnosis present

## 2018-07-05 DIAGNOSIS — Z85828 Personal history of other malignant neoplasm of skin: Secondary | ICD-10-CM | POA: Diagnosis not present

## 2018-07-05 DIAGNOSIS — K529 Noninfective gastroenteritis and colitis, unspecified: Secondary | ICD-10-CM | POA: Diagnosis not present

## 2018-07-05 LAB — BASIC METABOLIC PANEL
Anion gap: 6 (ref 5–15)
BUN: <5 mg/dL — ABNORMAL LOW (ref 8–23)
CO2: 27 mmol/L (ref 22–32)
Calcium: 9 mg/dL (ref 8.9–10.3)
Chloride: 100 mmol/L (ref 98–111)
Creatinine, Ser: 0.71 mg/dL (ref 0.44–1.00)
GFR calc Af Amer: >60 mL/min (ref >60)
GLUCOSE: 118 mg/dL — AB (ref 70–99)
POTASSIUM: 3.9 mmol/L (ref 3.5–5.1)
Sodium: 133 mmol/L — ABNORMAL LOW (ref 135–145)

## 2018-07-05 MED ORDER — SIMETHICONE 80 MG PO CHEW
80.0000 mg | CHEWABLE_TABLET | Freq: Four times a day (QID) | ORAL | Status: DC | PRN
  Administered 2018-07-05: 80 mg via ORAL
  Filled 2018-07-05: qty 1

## 2018-07-05 NOTE — Progress Notes (Signed)
PROGRESS NOTE  Monica Faulkner PQZ:[id number] DOB: 1941/01/15 DOA: 07/02/2018 PCP: Merrilee Seashore, MD  HPI/Recap of past 24 hours: HPI from Dr Karleen Hampshire Monica Faulkner is a 77 y.o. female with medical history significant for DM diet controlled, hypertension, GERD, depression, recent h/o of E coli infection and diarrhea presents to ED with similar complaints of nausea, watery non-bloody diarrhea and generalized weakness for the past 3 days. Reports some associated mild intermittent lower quadrant abdominal pain. Pt denies any fever/chills, vomiting, chest pain.  Of note, patient reported taking Imodium without any relief.  In the ED, lab work showed sodium of 123, hypokalemia.  Patient was given Imodium.  Patient admitted for further management.   Today pt reported no diarrhea since starting antibiotics, feels hungry, wants to try an advanced diet. Denies any significant abdominal pain, fever/chills, vomiting, chest pain, SOB.  Assessment/Plan: Active Problems:   Hyponatremia  E.coli gastroenteritis/colitis Afebrile, no leukocytosis Gastrointestinal panel grew E.coli, C. Difficile negative Continue with IV fluids Advance diet to regular Continue ciprofloxacin x 5 days Monitor closely  Hyponatremia/hypokalemia Improving Likely due to above, in addition to hydrochlorothiazide use Continue IV fluids Replace PRN Daily BMP  Hypertension Stable Continue home meds except hydrochlorothiazide  Diet controlled diabetes mellitus Carb modified diet  Hyperlipidemia Continue statins    Code Status: Full  Family Communication: None at bedside  Disposition Plan: Home once stable   Consultants:  None  Procedures:  None  Antimicrobials:  Ciprofloxacin   DVT prophylaxis: Lovenox   Objective: Vitals:   07/04/18 1349 07/04/18 1951 07/05/18 0450 07/05/18 1334  BP: (!) 160/69 (!) 174/68 138/72 (!) 173/66  Pulse: 72 71 67 64  Resp: 16 14 16 16   Temp: 98.3 F  (36.8 C) 99 F (37.2 C) 99 F (37.2 C) 98.1 F (36.7 C)  TempSrc: Oral Oral Oral Oral  SpO2: 98% 96% 97% 97%    Intake/Output Summary (Last 24 hours) at 07/05/2018 1628 Last data filed at 07/05/2018 0948 Gross per 24 hour  Intake 600 ml  Output -  Net 600 ml   There were no vitals filed for this visit.  Exam:  General: NAD   Cardiovascular: S1, S2 present  Respiratory: CTAB  Abdomen: Soft, nontender, nondistended, bowel sounds present  Musculoskeletal: No bilateral pedal edema noted  Skin: Normal  Psychiatry: Normal mood   Data Reviewed: CBC: Recent Labs  Lab 07/02/18 0852 07/03/18 0452 07/04/18 0530  WBC 5.6 4.8 5.7  NEUTROABS 3.9  --  3.9  HGB 11.2* 10.4* 11.2*  HCT 32.5* 31.3* 33.5*  MCV 89.0 94.8 92.8  PLT 226 186 335   Basic Metabolic Panel: Recent Labs  Lab 07/02/18 0852 07/03/18 0452 07/04/18 0530 07/05/18 0520  NA 123* 128* 130* 133*  K 3.1* 4.1 3.7 3.9  CL 83* 94* 96* 100  CO2 29 27 25 27   GLUCOSE 108* 102* 119* 118*  BUN 22 16 12  <5*  CREATININE 0.95 0.93 0.70 0.71  CALCIUM 9.2 9.0 9.1 9.0  MG 1.4*  --   --   --    GFR: CrCl cannot be calculated (Unknown ideal weight.). Liver Function Tests: No results for input(s): AST, ALT, ALKPHOS, BILITOT, PROT, ALBUMIN in the last 168 hours. No results for input(s): LIPASE, AMYLASE in the last 168 hours. No results for input(s): AMMONIA in the last 168 hours. Coagulation Profile: No results for input(s): INR, PROTIME in the last 168 hours. Cardiac Enzymes: No results for input(s): CKTOTAL, CKMB, CKMBINDEX, TROPONINI in the  last 168 hours. BNP (last 3 results) No results for input(s): PROBNP in the last 8760 hours. HbA1C: No results for input(s): HGBA1C in the last 72 hours. CBG: No results for input(s): GLUCAP in the last 168 hours. Lipid Profile: No results for input(s): CHOL, HDL, LDLCALC, TRIG, CHOLHDL, LDLDIRECT in the last 72 hours. Thyroid Function Tests: No results for  input(s): TSH, T4TOTAL, FREET4, T3FREE, THYROIDAB in the last 72 hours. Anemia Panel: No results for input(s): VITAMINB12, FOLATE, FERRITIN, TIBC, IRON, RETICCTPCT in the last 72 hours. Urine analysis:    Component Value Date/Time   COLORURINE YELLOW 04/29/2018 1220   APPEARANCEUR CLEAR 04/29/2018 1220   LABSPEC 1.004 (L) 04/29/2018 1220   PHURINE 6.0 04/29/2018 1220   GLUCOSEU NEGATIVE 04/29/2018 1220   HGBUR SMALL (A) 04/29/2018 1220   BILIRUBINUR NEGATIVE 04/29/2018 1220   KETONESUR NEGATIVE 04/29/2018 1220   PROTEINUR NEGATIVE 04/29/2018 1220   UROBILINOGEN 0.2 12/11/2013 1600   NITRITE NEGATIVE 04/29/2018 1220   LEUKOCYTESUR TRACE (A) 04/29/2018 1220   Sepsis Labs: @LABRCNTIP (procalcitonin:4,lacticidven:4)  ) Recent Results (from the past 240 hour(s))  Gastrointestinal Panel by PCR , Stool     Status: Abnormal   Collection Time: 07/02/18  8:52 AM  Result Value Ref Range Status   Campylobacter species NOT DETECTED NOT DETECTED Final   Plesimonas shigelloides NOT DETECTED NOT DETECTED Final   Salmonella species NOT DETECTED NOT DETECTED Final   Yersinia enterocolitica NOT DETECTED NOT DETECTED Final   Vibrio species NOT DETECTED NOT DETECTED Final   Vibrio cholerae NOT DETECTED NOT DETECTED Final   Enteroaggregative E coli (EAEC) NOT DETECTED NOT DETECTED Final   Enteropathogenic E coli (EPEC) DETECTED (A) NOT DETECTED Final    Comment: RESULT CALLED TO, READ BACK BY AND VERIFIED WITH: CINDY HUGHY AT 1422 ON 07/04/18 BY SNJ    Enterotoxigenic E coli (ETEC) NOT DETECTED NOT DETECTED Final   Shiga like toxin producing E coli (STEC) NOT DETECTED NOT DETECTED Final   Shigella/Enteroinvasive E coli (EIEC) NOT DETECTED NOT DETECTED Final   Cryptosporidium NOT DETECTED NOT DETECTED Final   Cyclospora cayetanensis NOT DETECTED NOT DETECTED Final   Entamoeba histolytica NOT DETECTED NOT DETECTED Final   Giardia lamblia NOT DETECTED NOT DETECTED Final   Adenovirus F40/41 NOT  DETECTED NOT DETECTED Final   Astrovirus NOT DETECTED NOT DETECTED Final   Norovirus GI/GII NOT DETECTED NOT DETECTED Final   Rotavirus A NOT DETECTED NOT DETECTED Final   Sapovirus (I, II, IV, and V) NOT DETECTED NOT DETECTED Final    Comment: Performed at [facility], [address] [place], [place] [address]  C difficile quick scan w PCR reflex     Status: None   Collection Time: 07/02/18  8:53 AM  Result Value Ref Range Status   C Diff antigen NEGATIVE NEGATIVE Final   C Diff toxin NEGATIVE NEGATIVE Final   C Diff interpretation No C. difficile detected.  Final    Comment: Performed at [facility], [place] [address] [place], [place] [address]      Studies: No results found.  Scheduled Meds: . atenolol  25 mg Oral Daily  . clonazePAM  1 mg Oral QHS  . cloNIDine  0.2 mg Oral QHS  . enoxaparin (LOVENOX) injection  40 mg Subcutaneous Q24H  . pravastatin  20 mg Oral QODAY    Continuous Infusions: . sodium chloride 75 mL/hr at 07/05/18 1103  . ciprofloxacin 400 mg (07/05/18 1600)     LOS: 0  days     Alma Friendly, MD [facility]   If 7PM-7AM, please contact night-coverage [email or web address] 07/05/2018, 4:28 PM

## 2018-07-05 NOTE — Progress Notes (Signed)
Physical Therapy Treatment Patient Details Name: Monica Faulkner MRN: [id number] DOB: 1941/07/20 Today's Date: 07/05/2018    History of Present Illness Pt admitted with hyponatremia and dehydration 2* nausea and diarrhea.  Pt with hx of DM and anxiety/depression    PT Comments    Pt continues to participate well.    Follow Up Recommendations  Home health PT;Supervision - Intermittent     Equipment Recommendations  None recommended by PT    Recommendations for Other Services       Precautions / Restrictions Precautions Precautions: Fall Restrictions Weight Bearing Restrictions: No    Mobility  Bed Mobility Overal bed mobility: Modified Independent                Transfers Overall transfer level: Needs assistance   Transfers: Sit to/from Stand Sit to Stand: Min guard         General transfer comment: close guard for safety. Increased time.   Ambulation/Gait Ambulation/Gait assistance: Min guard Gait Distance (Feet): 400 Feet(15 with IV pole/furniture walking) Assistive device: Rolling walker (2 wheeled)       General Gait Details: slow gait speed. Intermittent mild unsteadiness noted. Pt c/o "wooziness"-hx of chronic dizziness.    Stairs             Wheelchair Mobility    Modified Rankin (Stroke Patients Only)       Balance Overall balance assessment: Needs assistance         Standing balance support: Bilateral upper extremity supported Standing balance-Leahy Scale: Fair                              Cognition Arousal/Alertness: Awake/alert Behavior During Therapy: WFL for tasks assessed/performed Overall Cognitive Status: Within Functional Limits for tasks assessed                                        Exercises      General Comments        Pertinent Vitals/Pain Pain Assessment: No/denies pain    Home Living                      Prior Function            PT Goals  (current goals can now be found in the care plan section) Progress towards PT goals: Progressing toward goals    Frequency    Min 3X/week      PT Plan Current plan remains appropriate    Co-evaluation              AM-PAC PT "6 Clicks" Daily Activity  Outcome Measure  Difficulty turning over in bed (including adjusting bedclothes, sheets and blankets)?: None Difficulty moving from lying on back to sitting on the side of the bed? : A Little Difficulty sitting down on and standing up from a chair with arms (e.g., wheelchair, bedside commode, etc,.)?: A Little Help needed moving to and from a bed to chair (including a wheelchair)?: A Little Help needed walking in hospital room?: A Little Help needed climbing 3-5 steps with a railing? : A Little 6 Click Score: 19    End of Session Equipment Utilized During Treatment: Gait belt Activity Tolerance: Patient tolerated treatment well Patient left: in bed;with call bell/phone within reach;with bed alarm set   PT Visit Diagnosis: Unsteadiness on  feet (R26.81);Muscle weakness (generalized) (M62.81)     Time: 4069-8614 PT Time Calculation (min) (ACUTE ONLY): 37 min  Charges:  $Gait Training: 23-37 mins                        Weston Anna, PT Acute Rehabilitation Services Pager: [phone] Office: [phone]

## 2018-07-06 LAB — BASIC METABOLIC PANEL
Anion gap: 4 — ABNORMAL LOW (ref 5–15)
BUN: <5 mg/dL — ABNORMAL LOW (ref 8–23)
CALCIUM: 8.5 mg/dL — AB (ref 8.9–10.3)
CHLORIDE: 101 mmol/L (ref 98–111)
CO2: 28 mmol/L (ref 22–32)
Creatinine, Ser: 0.65 mg/dL (ref 0.44–1.00)
Glucose, Bld: 141 mg/dL — ABNORMAL HIGH (ref 70–99)
Potassium: 3.6 mmol/L (ref 3.5–5.1)
SODIUM: 133 mmol/L — AB (ref 135–145)

## 2018-07-06 MED ORDER — HYDRALAZINE HCL 20 MG/ML IJ SOLN
10.0000 mg | Freq: Once | INTRAMUSCULAR | Status: AC
  Administered 2018-07-06: 10 mg via INTRAVENOUS
  Filled 2018-07-06: qty 1

## 2018-07-06 MED ORDER — SIMETHICONE 80 MG PO CHEW: 80.0000 mg | CHEWABLE_TABLET | Freq: Four times a day (QID) | ORAL | 0 refills | Status: DC | PRN

## 2018-07-06 MED ORDER — CIPROFLOXACIN HCL 500 MG PO TABS: 500.0000 mg | ORAL_TABLET | Freq: Two times a day (BID) | ORAL | 0 refills | Status: AC

## 2018-07-06 MED ORDER — LISINOPRIL 20 MG PO TABS: 20.0000 mg | ORAL_TABLET | Freq: Every day | ORAL | 0 refills | Status: DC

## 2018-07-06 NOTE — Care Management Note (Signed)
Case Management Note  Patient Details  Name: Monica Faulkner MRN: [id number] Date of Birth: [birth year]  Subjective/Objective:                  Discharge planning  Action/Plan: Pt through advanced hhc  Expected Discharge Date:  07/06/18               Expected Discharge Plan:  [place]  In-House Referral:     Discharge planning Services  CM Consult  Post Acute Care Choice:  Home Health Choice offered to:  Patient  DME Arranged:    DME Agency:     HH Arranged:  PT [place]:  [place]  Status of Service:  Completed, signed off  If discussed at [place] of Stay Meetings, dates discussed:    Additional Comments:  Leeroy Cha, RN 07/06/2018, 12:39 PM

## 2018-07-06 NOTE — Progress Notes (Signed)
Patient received from [facility]. VS stable. NAD on RA. NS infusing at 62ml/hr to the [facility]. I agree with previous notation from this shift. Patient assisted to the bathroom. Beverage provided. Will continue to monitor.

## 2018-07-06 NOTE — Discharge Summary (Signed)
Discharge Summary  Monica Faulkner YOV:[id number] DOB: Oct 31, 1940  PCP: Merrilee Seashore, MD  Admit date: 07/02/2018 Discharge date: 07/06/2018  Time spent: 35 mins  Recommendations for Outpatient Follow-up:  1. PCP in 1 week with repeat labs  Discharge Diagnoses:  Active Hospital Problems   Diagnosis Date Noted  . Hyponatremia 07/02/2018    Resolved Hospital Problems  No resolved problems to display.    Discharge Condition: Stable  Diet recommendation: Heart healthy, mod carb  Vitals:   07/05/18 1334 07/06/18 0444  BP: (!) 173/66 (!) 157/68  Pulse: 64 (!) 56  Resp: 16 18  Temp: 98.1 F (36.7 C) 97.6 F (36.4 C)  SpO2: 97% 97%    History of present illness:  Monica Faulkner a 77 y.o.femalewith medical history significant for DM diet controlled, hypertension, GERD, depression, recent h/o of E coli infection and diarrhea presents to ED with similar complaints of nausea, watery non-bloody diarrhea and generalized weakness for the past 3 days. Reports some associated mild intermittent lower quadrant abdominal pain. Pt denies any fever/chills, vomiting, chest pain.  Of note, patient reported taking Imodium without any relief.  In the ED, lab work showed sodium of 123, hypokalemia.  Patient was given Imodium.  Patient admitted for further management.  Pt reported feeling much better, denies any new complaints. Diarrhea has resolved, denies any n/v, abdominal pain, fever/chills. Tolerated regular diet. Stable to be d/c with close follow up with PCP.   Hospital Course:  Active Problems:   Hyponatremia  E.coli gastroenteritis/colitis Afebrile, no leukocytosis Gastrointestinal panel grew E.coli, C. Difficile negative Advised to stay hydrated Continue ciprofloxacin for a total of 5 days  Hyponatremia/hypokalemia Improving Likely due to above, in addition to hydrochlorothiazide use Discontinued HCTZ due to severe hyponatremia causing weakness PCP to repeat  labs  Hypertension Stable Continue home meds, discontinue hydrochlorothiazide  Diet controlled diabetes mellitus Carb modified diet  Hyperlipidemia Continue statins   Procedures:  None   Consultations:  None  Discharge Exam: BP (!) 157/68 (BP Location: Left Arm)   Pulse (!) 56   Temp 97.6 F (36.4 C) (Oral)   Resp 18   SpO2 97%   General: NAD Cardiovascular: S1, S2 present  Respiratory: CTAB  Discharge Instructions You were cared for by a hospitalist during your hospital stay. If you have any questions about your discharge medications or the care you received while you were in the hospital after you are discharged, you can call the unit and asked to speak with the hospitalist on call if the hospitalist that took care of you is not available. Once you are discharged, your primary care physician will handle any further medical issues. Please note that NO REFILLS for any discharge medications will be authorized once you are discharged, as it is imperative that you return to your primary care physician (or establish a relationship with a primary care physician if you do not have one) for your aftercare needs so that they can reassess your need for medications and monitor your lab values.   Allergies as of 07/06/2018      Reactions   Avapro [irbesartan] Other (See Comments)   Difficulty breathing, feeling like she was going to pass out   Calcium Channel Blockers    ineffective   Decongestant [pseudoephedrine] Other (See Comments)   Heart races , blood pressure goes up   Norvasc [amlodipine Besylate] Itching, Other (See Comments), Hypertension   Turned skin red   Sulfa Antibiotics Other (See Comments)   "Made  kidney infection worse"      Medication List    STOP taking these medications   lisinopril-hydrochlorothiazide 20-25 MG tablet Commonly known as:  PRINZIDE,ZESTORETIC   metroNIDAZOLE 500 MG tablet Commonly known as:  FLAGYL     TAKE these medications     atenolol 25 MG tablet Commonly known as:  TENORMIN Take 25 mg by mouth every evening.   cholecalciferol 1000 units tablet Commonly known as:  VITAMIN D Take 1,000 Units by mouth daily.   ciprofloxacin 500 MG tablet Commonly known as:  CIPRO Take 1 tablet (500 mg total) by mouth 2 (two) times daily for 7 doses. What changed:    when to take this  additional instructions   clonazePAM 0.5 MG tablet Commonly known as:  KLONOPIN Take 1 mg by mouth at bedtime. May repeat one tablet as needed for sleep   cloNIDine 0.2 MG tablet Commonly known as:  CATAPRES Take 0.2 mg by mouth at bedtime.   diphenhydrAMINE 25 mg capsule Commonly known as:  BENADRYL Take 25 mg by mouth at bedtime as needed for allergies.   doxepin 25 MG capsule Commonly known as:  SINEQUAN Take 25 mg by mouth at bedtime as needed (sleep).   escitalopram 10 MG tablet Commonly known as:  LEXAPRO Take 10 mg by mouth every morning.   ibuprofen 200 MG tablet Commonly known as:  ADVIL,MOTRIN Take 200 mg by mouth daily as needed for fever or mild pain.   IMODIUM A-D PO Take 2 tablets by mouth every hour as needed (diarrhea).   JANUVIA 100 MG tablet Generic drug:  sitaGLIPtin Take 100 mg by mouth daily.   lisinopril 20 MG tablet Commonly known as:  PRINIVIL,ZESTRIL Take 1 tablet (20 mg total) by mouth daily.   naphazoline-pheniramine 0.025-0.3 % ophthalmic solution Commonly known as:  NAPHCON-A Place 1 drop into both eyes as needed for eye irritation (dry eyes).   ondansetron 8 MG tablet Commonly known as:  ZOFRAN Take 1 tablet (8 mg total) by mouth every 8 (eight) hours as needed for nausea or vomiting.   pravastatin 20 MG tablet Commonly known as:  PRAVACHOL Take 20 mg by mouth every other day.   simethicone 80 MG chewable tablet Commonly known as:  MYLICON Chew 1 tablet (80 mg total) by mouth every 6 (six) hours as needed for flatulence.   zolpidem 5 MG tablet Commonly known as:  AMBIEN Take  5 mg by mouth at bedtime as needed for sleep.      Allergies  Allergen Reactions  . Avapro [Irbesartan] Other (See Comments)    Difficulty breathing, feeling like she was going to pass out  . Calcium Channel Blockers     ineffective  . Decongestant [Pseudoephedrine] Other (See Comments)    Heart races , blood pressure goes up  . Norvasc [Amlodipine Besylate] Itching, Other (See Comments) and Hypertension    Turned skin red  . Sulfa Antibiotics Other (See Comments)    "Made kidney infection worse"   Follow-up Information    Merrilee Seashore, MD. Schedule an appointment as soon as possible for a visit in 1 week(s).   Specialty:  Internal Medicine Contact information: [address] [place] [address] [phone]            The results of significant diagnostics from this hospitalization (including imaging, microbiology, ancillary and laboratory) are listed below for reference.    Significant Diagnostic Studies: No results found.  Microbiology: Recent Results (from the past 240  hour(s))  Gastrointestinal Panel by PCR , Stool     Status: Abnormal   Collection Time: 07/02/18  8:52 AM  Result Value Ref Range Status   Campylobacter species NOT DETECTED NOT DETECTED Final   Plesimonas shigelloides NOT DETECTED NOT DETECTED Final   Salmonella species NOT DETECTED NOT DETECTED Final   Yersinia enterocolitica NOT DETECTED NOT DETECTED Final   Vibrio species NOT DETECTED NOT DETECTED Final   Vibrio cholerae NOT DETECTED NOT DETECTED Final   Enteroaggregative E coli (EAEC) NOT DETECTED NOT DETECTED Final   Enteropathogenic E coli (EPEC) DETECTED (A) NOT DETECTED Final    Comment: RESULT CALLED TO, READ BACK BY AND VERIFIED WITH: CINDY HUGHY AT 1422 ON 07/04/18 BY SNJ    Enterotoxigenic E coli (ETEC) NOT DETECTED NOT DETECTED Final   Shiga like toxin producing E coli (STEC) NOT DETECTED NOT DETECTED Final   Shigella/Enteroinvasive E coli (EIEC) NOT  DETECTED NOT DETECTED Final   Cryptosporidium NOT DETECTED NOT DETECTED Final   Cyclospora cayetanensis NOT DETECTED NOT DETECTED Final   Entamoeba histolytica NOT DETECTED NOT DETECTED Final   Giardia lamblia NOT DETECTED NOT DETECTED Final   Adenovirus F40/41 NOT DETECTED NOT DETECTED Final   Astrovirus NOT DETECTED NOT DETECTED Final   Norovirus GI/GII NOT DETECTED NOT DETECTED Final   Rotavirus A NOT DETECTED NOT DETECTED Final   Sapovirus (I, II, IV, and V) NOT DETECTED NOT DETECTED Final    Comment: Performed at [facility], [address] [place], [place] [address]  C difficile quick scan w PCR reflex     Status: None   Collection Time: 07/02/18  8:53 AM  Result Value Ref Range Status   C Diff antigen NEGATIVE NEGATIVE Final   C Diff toxin NEGATIVE NEGATIVE Final   C Diff interpretation No C. difficile detected.  Final    Comment: Performed at [facility], [place] [address] [place], [place] [address]     Labs: Basic Metabolic Panel: Recent Labs  Lab 07/02/18 [phone] 07/03/18 0452 07/04/18 0530 07/05/18 0520 07/06/18 0531  NA 123* 128* 130* 133* 133*  K 3.1* 4.1 3.7 3.9 3.6  CL 83* 94* 96* 100 101  CO2 29 27 25 27 28   GLUCOSE 108* 102* 119* 118* 141*  BUN 22 16 12  <5* <5*  CREATININE 0.95 0.93 0.70 0.71 0.65  CALCIUM 9.2 9.0 9.1 9.0 8.5*  MG 1.4*  --   --   --   --    Liver Function Tests: No results for input(s): AST, ALT, ALKPHOS, BILITOT, PROT, ALBUMIN in the last 168 hours. No results for input(s): LIPASE, AMYLASE in the last 168 hours. No results for input(s): AMMONIA in the last 168 hours. CBC: Recent Labs  Lab 07/02/18 0852 07/03/18 0452 07/04/18 0530  WBC 5.6 4.8 5.7  NEUTROABS 3.9  --  3.9  HGB 11.2* 10.4* 11.2*  HCT 32.5* 31.3* 33.5*  MCV 89.0 94.8 92.8  PLT 226 186 247   Cardiac Enzymes: No results for input(s): CKTOTAL, CKMB, CKMBINDEX, TROPONINI in the last 168 hours. BNP: BNP (last 3 results) No results for  input(s): BNP in the last 8760 hours.  ProBNP (last 3 results) No results for input(s): PROBNP in the last 8760 hours.  CBG: No results for input(s): GLUCAP in the last 168 hours.     Signed:  Alma Friendly, MD [facility] 07/06/2018, 12:21 PM

## 2018-11-29 ENCOUNTER — Other Ambulatory Visit: Payer: Self-pay

## 2018-11-29 NOTE — Patient Outreach (Signed)
[facility]) Care Management  11/29/2018  Aeris Kynadi Dragos [birth year] [id number]   Medication Adherence call to Mrs. Corina Devereux patient did not answer patient is past due on Pravastatin 20 mg under [facility].   [place] Management Direct Dial [phone]  Fax [phone] [email or web address]

## 2019-02-14 ENCOUNTER — Other Ambulatory Visit: Payer: Self-pay

## 2019-02-14 ENCOUNTER — Encounter (INDEPENDENT_AMBULATORY_CARE_PROVIDER_SITE_OTHER): Payer: Medicare Other | Admitting: Ophthalmology

## 2019-02-14 DIAGNOSIS — I10 Essential (primary) hypertension: Secondary | ICD-10-CM

## 2019-02-14 DIAGNOSIS — H35033 Hypertensive retinopathy, bilateral: Secondary | ICD-10-CM | POA: Diagnosis not present

## 2019-02-14 DIAGNOSIS — H353132 Nonexudative age-related macular degeneration, bilateral, intermediate dry stage: Secondary | ICD-10-CM | POA: Diagnosis not present

## 2019-02-14 DIAGNOSIS — H35343 Macular cyst, hole, or pseudohole, bilateral: Secondary | ICD-10-CM

## 2019-09-07 DIAGNOSIS — F4322 Adjustment disorder with anxiety: Secondary | ICD-10-CM | POA: Diagnosis not present

## 2019-10-24 DIAGNOSIS — E785 Hyperlipidemia, unspecified: Secondary | ICD-10-CM | POA: Diagnosis not present

## 2019-10-24 DIAGNOSIS — E1122 Type 2 diabetes mellitus with diabetic chronic kidney disease: Secondary | ICD-10-CM | POA: Diagnosis not present

## 2019-10-24 DIAGNOSIS — N182 Chronic kidney disease, stage 2 (mild): Secondary | ICD-10-CM | POA: Diagnosis not present

## 2019-10-24 DIAGNOSIS — Z0000 Encounter for general adult medical examination without abnormal findings: Secondary | ICD-10-CM | POA: Diagnosis not present

## 2019-11-07 DIAGNOSIS — E1122 Type 2 diabetes mellitus with diabetic chronic kidney disease: Secondary | ICD-10-CM | POA: Diagnosis not present

## 2019-11-07 DIAGNOSIS — I129 Hypertensive chronic kidney disease with stage 1 through stage 4 chronic kidney disease, or unspecified chronic kidney disease: Secondary | ICD-10-CM | POA: Diagnosis not present

## 2019-11-07 DIAGNOSIS — F339 Major depressive disorder, recurrent, unspecified: Secondary | ICD-10-CM | POA: Diagnosis not present

## 2019-11-07 DIAGNOSIS — Z0000 Encounter for general adult medical examination without abnormal findings: Secondary | ICD-10-CM | POA: Diagnosis not present

## 2019-11-07 DIAGNOSIS — E785 Hyperlipidemia, unspecified: Secondary | ICD-10-CM | POA: Diagnosis not present

## 2019-11-07 DIAGNOSIS — N182 Chronic kidney disease, stage 2 (mild): Secondary | ICD-10-CM | POA: Diagnosis not present

## 2019-11-07 DIAGNOSIS — E871 Hypo-osmolality and hyponatremia: Secondary | ICD-10-CM | POA: Diagnosis not present

## 2019-11-07 DIAGNOSIS — F419 Anxiety disorder, unspecified: Secondary | ICD-10-CM | POA: Diagnosis not present

## 2019-12-12 DIAGNOSIS — E785 Hyperlipidemia, unspecified: Secondary | ICD-10-CM | POA: Diagnosis not present

## 2019-12-12 DIAGNOSIS — N182 Chronic kidney disease, stage 2 (mild): Secondary | ICD-10-CM | POA: Diagnosis not present

## 2019-12-12 DIAGNOSIS — E871 Hypo-osmolality and hyponatremia: Secondary | ICD-10-CM | POA: Diagnosis not present

## 2019-12-12 DIAGNOSIS — I129 Hypertensive chronic kidney disease with stage 1 through stage 4 chronic kidney disease, or unspecified chronic kidney disease: Secondary | ICD-10-CM | POA: Diagnosis not present

## 2019-12-19 DIAGNOSIS — E1122 Type 2 diabetes mellitus with diabetic chronic kidney disease: Secondary | ICD-10-CM | POA: Diagnosis not present

## 2019-12-19 DIAGNOSIS — N182 Chronic kidney disease, stage 2 (mild): Secondary | ICD-10-CM | POA: Diagnosis not present

## 2019-12-19 DIAGNOSIS — E871 Hypo-osmolality and hyponatremia: Secondary | ICD-10-CM | POA: Diagnosis not present

## 2019-12-19 DIAGNOSIS — I129 Hypertensive chronic kidney disease with stage 1 through stage 4 chronic kidney disease, or unspecified chronic kidney disease: Secondary | ICD-10-CM | POA: Diagnosis not present

## 2020-02-02 DIAGNOSIS — F4322 Adjustment disorder with anxiety: Secondary | ICD-10-CM | POA: Diagnosis not present

## 2020-02-08 DIAGNOSIS — E785 Hyperlipidemia, unspecified: Secondary | ICD-10-CM | POA: Diagnosis not present

## 2020-02-08 DIAGNOSIS — I129 Hypertensive chronic kidney disease with stage 1 through stage 4 chronic kidney disease, or unspecified chronic kidney disease: Secondary | ICD-10-CM | POA: Diagnosis not present

## 2020-02-08 DIAGNOSIS — E871 Hypo-osmolality and hyponatremia: Secondary | ICD-10-CM | POA: Diagnosis not present

## 2020-02-08 DIAGNOSIS — E1122 Type 2 diabetes mellitus with diabetic chronic kidney disease: Secondary | ICD-10-CM | POA: Diagnosis not present

## 2020-02-08 DIAGNOSIS — N182 Chronic kidney disease, stage 2 (mild): Secondary | ICD-10-CM | POA: Diagnosis not present

## 2020-02-15 ENCOUNTER — Other Ambulatory Visit: Payer: Self-pay

## 2020-02-15 ENCOUNTER — Encounter (INDEPENDENT_AMBULATORY_CARE_PROVIDER_SITE_OTHER): Payer: Medicare PPO | Admitting: Ophthalmology

## 2020-02-15 DIAGNOSIS — H35033 Hypertensive retinopathy, bilateral: Secondary | ICD-10-CM | POA: Diagnosis not present

## 2020-02-15 DIAGNOSIS — H35343 Macular cyst, hole, or pseudohole, bilateral: Secondary | ICD-10-CM | POA: Diagnosis not present

## 2020-02-15 DIAGNOSIS — I10 Essential (primary) hypertension: Secondary | ICD-10-CM

## 2020-02-22 DIAGNOSIS — E871 Hypo-osmolality and hyponatremia: Secondary | ICD-10-CM | POA: Diagnosis not present

## 2020-02-22 DIAGNOSIS — I129 Hypertensive chronic kidney disease with stage 1 through stage 4 chronic kidney disease, or unspecified chronic kidney disease: Secondary | ICD-10-CM | POA: Diagnosis not present

## 2020-02-22 DIAGNOSIS — E1122 Type 2 diabetes mellitus with diabetic chronic kidney disease: Secondary | ICD-10-CM | POA: Diagnosis not present

## 2020-02-22 DIAGNOSIS — N182 Chronic kidney disease, stage 2 (mild): Secondary | ICD-10-CM | POA: Diagnosis not present

## 2020-02-22 DIAGNOSIS — E785 Hyperlipidemia, unspecified: Secondary | ICD-10-CM | POA: Diagnosis not present

## 2020-06-27 DIAGNOSIS — F419 Anxiety disorder, unspecified: Secondary | ICD-10-CM | POA: Diagnosis not present

## 2020-07-20 DIAGNOSIS — E1122 Type 2 diabetes mellitus with diabetic chronic kidney disease: Secondary | ICD-10-CM | POA: Diagnosis not present

## 2020-07-20 DIAGNOSIS — E785 Hyperlipidemia, unspecified: Secondary | ICD-10-CM | POA: Diagnosis not present

## 2020-07-20 DIAGNOSIS — I129 Hypertensive chronic kidney disease with stage 1 through stage 4 chronic kidney disease, or unspecified chronic kidney disease: Secondary | ICD-10-CM | POA: Diagnosis not present

## 2020-07-20 DIAGNOSIS — N182 Chronic kidney disease, stage 2 (mild): Secondary | ICD-10-CM | POA: Diagnosis not present

## 2020-07-27 DIAGNOSIS — E1122 Type 2 diabetes mellitus with diabetic chronic kidney disease: Secondary | ICD-10-CM | POA: Diagnosis not present

## 2020-07-27 DIAGNOSIS — N182 Chronic kidney disease, stage 2 (mild): Secondary | ICD-10-CM | POA: Diagnosis not present

## 2020-07-27 DIAGNOSIS — E785 Hyperlipidemia, unspecified: Secondary | ICD-10-CM | POA: Diagnosis not present

## 2020-07-27 DIAGNOSIS — I129 Hypertensive chronic kidney disease with stage 1 through stage 4 chronic kidney disease, or unspecified chronic kidney disease: Secondary | ICD-10-CM | POA: Diagnosis not present

## 2020-07-27 DIAGNOSIS — Z23 Encounter for immunization: Secondary | ICD-10-CM | POA: Diagnosis not present

## 2020-11-12 DIAGNOSIS — E1122 Type 2 diabetes mellitus with diabetic chronic kidney disease: Secondary | ICD-10-CM | POA: Diagnosis not present

## 2020-11-12 DIAGNOSIS — I129 Hypertensive chronic kidney disease with stage 1 through stage 4 chronic kidney disease, or unspecified chronic kidney disease: Secondary | ICD-10-CM | POA: Diagnosis not present

## 2020-11-12 DIAGNOSIS — E785 Hyperlipidemia, unspecified: Secondary | ICD-10-CM | POA: Diagnosis not present

## 2020-11-12 DIAGNOSIS — N182 Chronic kidney disease, stage 2 (mild): Secondary | ICD-10-CM | POA: Diagnosis not present

## 2020-11-12 DIAGNOSIS — Z0000 Encounter for general adult medical examination without abnormal findings: Secondary | ICD-10-CM | POA: Diagnosis not present

## 2020-11-12 DIAGNOSIS — I10 Essential (primary) hypertension: Secondary | ICD-10-CM | POA: Diagnosis not present

## 2020-11-19 DIAGNOSIS — F331 Major depressive disorder, recurrent, moderate: Secondary | ICD-10-CM | POA: Diagnosis not present

## 2020-11-19 DIAGNOSIS — E1122 Type 2 diabetes mellitus with diabetic chronic kidney disease: Secondary | ICD-10-CM | POA: Diagnosis not present

## 2020-11-19 DIAGNOSIS — Z0000 Encounter for general adult medical examination without abnormal findings: Secondary | ICD-10-CM | POA: Diagnosis not present

## 2020-11-19 DIAGNOSIS — I129 Hypertensive chronic kidney disease with stage 1 through stage 4 chronic kidney disease, or unspecified chronic kidney disease: Secondary | ICD-10-CM | POA: Diagnosis not present

## 2020-11-19 DIAGNOSIS — N182 Chronic kidney disease, stage 2 (mild): Secondary | ICD-10-CM | POA: Diagnosis not present

## 2020-11-19 DIAGNOSIS — E785 Hyperlipidemia, unspecified: Secondary | ICD-10-CM | POA: Diagnosis not present

## 2020-11-28 DIAGNOSIS — F419 Anxiety disorder, unspecified: Secondary | ICD-10-CM | POA: Diagnosis not present

## 2021-02-21 ENCOUNTER — Encounter (INDEPENDENT_AMBULATORY_CARE_PROVIDER_SITE_OTHER): Payer: Medicare PPO | Admitting: Ophthalmology

## 2021-02-21 ENCOUNTER — Other Ambulatory Visit: Payer: Self-pay

## 2021-02-21 DIAGNOSIS — I10 Essential (primary) hypertension: Secondary | ICD-10-CM

## 2021-02-21 DIAGNOSIS — H35033 Hypertensive retinopathy, bilateral: Secondary | ICD-10-CM

## 2021-02-21 DIAGNOSIS — H35343 Macular cyst, hole, or pseudohole, bilateral: Secondary | ICD-10-CM | POA: Diagnosis not present

## 2021-02-21 DIAGNOSIS — H353124 Nonexudative age-related macular degeneration, left eye, advanced atrophic with subfoveal involvement: Secondary | ICD-10-CM | POA: Diagnosis not present

## 2021-04-18 ENCOUNTER — Encounter (HOSPITAL_COMMUNITY): Payer: Self-pay

## 2021-04-18 ENCOUNTER — Emergency Department (HOSPITAL_COMMUNITY): Payer: Medicare PPO

## 2021-04-18 ENCOUNTER — Other Ambulatory Visit: Payer: Self-pay

## 2021-04-18 ENCOUNTER — Emergency Department (HOSPITAL_COMMUNITY)
Admission: EM | Admit: 2021-04-18 | Discharge: 2021-04-18 | Disposition: A | Discharge status: Home / Self Care | Payer: Medicare PPO | Attending: Emergency Medicine | Admitting: Emergency Medicine

## 2021-04-18 DIAGNOSIS — R5383 Other fatigue: Secondary | ICD-10-CM | POA: Diagnosis not present

## 2021-04-18 DIAGNOSIS — Z79899 Other long term (current) drug therapy: Secondary | ICD-10-CM | POA: Insufficient documentation

## 2021-04-18 DIAGNOSIS — I10 Essential (primary) hypertension: Secondary | ICD-10-CM | POA: Diagnosis not present

## 2021-04-18 DIAGNOSIS — R110 Nausea: Secondary | ICD-10-CM | POA: Diagnosis not present

## 2021-04-18 DIAGNOSIS — E119 Type 2 diabetes mellitus without complications: Secondary | ICD-10-CM | POA: Insufficient documentation

## 2021-04-18 DIAGNOSIS — R531 Weakness: Secondary | ICD-10-CM | POA: Diagnosis not present

## 2021-04-18 DIAGNOSIS — Z85828 Personal history of other malignant neoplasm of skin: Secondary | ICD-10-CM | POA: Insufficient documentation

## 2021-04-18 DIAGNOSIS — J323 Chronic sphenoidal sinusitis: Secondary | ICD-10-CM | POA: Diagnosis not present

## 2021-04-18 DIAGNOSIS — Z20822 Contact with and (suspected) exposure to covid-19: Secondary | ICD-10-CM | POA: Diagnosis not present

## 2021-04-18 DIAGNOSIS — I672 Cerebral atherosclerosis: Secondary | ICD-10-CM | POA: Diagnosis not present

## 2021-04-18 DIAGNOSIS — R42 Dizziness and giddiness: Secondary | ICD-10-CM | POA: Diagnosis not present

## 2021-04-18 LAB — COMPREHENSIVE METABOLIC PANEL
ALT: 13 U/L (ref 0–44)
AST: 18 U/L (ref 15–41)
Albumin: 4 g/dL (ref 3.5–5.0)
Alkaline Phosphatase: 57 U/L (ref 38–126)
Anion gap: 8 (ref 5–15)
BUN: 14 mg/dL (ref 8–23)
CO2: 26 mmol/L (ref 22–32)
Calcium: 9.5 mg/dL (ref 8.9–10.3)
Chloride: 96 mmol/L — ABNORMAL LOW (ref 98–111)
Creatinine, Ser: 0.82 mg/dL (ref 0.44–1.00)
GFR, Estimated: >60 mL/min (ref >60)
Glucose, Bld: 122 mg/dL — ABNORMAL HIGH (ref 70–99)
Potassium: 4 mmol/L (ref 3.5–5.1)
Sodium: 130 mmol/L — ABNORMAL LOW (ref 135–145)
Total Bilirubin: 0.8 mg/dL (ref 0.3–1.2)
Total Protein: 6.8 g/dL (ref 6.5–8.1)

## 2021-04-18 LAB — URINALYSIS, ROUTINE W REFLEX MICROSCOPIC
Bacteria, UA: NONE SEEN
Bilirubin Urine: NEGATIVE
Glucose, UA: NEGATIVE mg/dL
Hgb urine dipstick: NEGATIVE
Ketones, ur: NEGATIVE mg/dL
Leukocytes,Ua: NEGATIVE
Nitrite: NEGATIVE
Protein, ur: NEGATIVE mg/dL
Specific Gravity, Urine: 1.01 (ref 1.005–1.030)
pH: 7.5 (ref 5.0–8.0)

## 2021-04-18 LAB — CBC WITH DIFFERENTIAL/PLATELET
Abs Immature Granulocytes: 0.03 10*3/uL (ref 0.00–0.07)
Basophils Absolute: 0 10*3/uL (ref 0.0–0.1)
Basophils Relative: 0 %
Eosinophils Absolute: 0.2 10*3/uL (ref 0.0–0.5)
Eosinophils Relative: 2 %
HCT: 38.2 % (ref 36.0–46.0)
Hemoglobin: 13.3 g/dL (ref 12.0–15.0)
Immature Granulocytes: 0 %
Lymphocytes Relative: 12 %
Lymphs Abs: 1 10*3/uL (ref 0.7–4.0)
MCH: 32.3 pg (ref 26.0–34.0)
MCHC: 34.8 g/dL (ref 30.0–36.0)
MCV: 92.7 fL (ref 80.0–100.0)
Monocytes Absolute: 0.7 10*3/uL (ref 0.1–1.0)
Monocytes Relative: 8 %
Neutro Abs: 6.5 10*3/uL (ref 1.7–7.7)
Neutrophils Relative %: 78 %
Platelets: 277 10*3/uL (ref 150–400)
RBC: 4.12 MIL/uL (ref 3.87–5.11)
RDW: 11.7 % (ref 11.5–15.5)
WBC: 8.4 10*3/uL (ref 4.0–10.5)
nRBC: 0 % (ref 0.0–0.2)

## 2021-04-18 LAB — RESP PANEL BY RT-PCR (FLU A&B, COVID) ARPGX2
Influenza A by PCR: NEGATIVE
Influenza B by PCR: NEGATIVE
SARS Coronavirus 2 by RT PCR: NEGATIVE

## 2021-04-18 LAB — LIPASE, BLOOD: Lipase: 36 U/L (ref 11–51)

## 2021-04-18 MED ORDER — ONDANSETRON HCL 4 MG PO TABS: 4.0000 mg | ORAL_TABLET | Freq: Four times a day (QID) | ORAL | 0 refills | Status: DC

## 2021-04-18 MED ORDER — CLONAZEPAM 0.5 MG PO TABS
0.5000 mg | ORAL_TABLET | Freq: Once | ORAL | Status: AC
  Administered 2021-04-18: 0.5 mg via ORAL
  Filled 2021-04-18: qty 1

## 2021-04-18 MED ORDER — SODIUM CHLORIDE 0.9 % IV BOLUS
1000.0000 mL | Freq: Once | INTRAVENOUS | Status: AC
  Administered 2021-04-18: 1000 mL via INTRAVENOUS

## 2021-04-18 MED ORDER — ONDANSETRON HCL 4 MG/2ML IJ SOLN
4.0000 mg | Freq: Once | INTRAMUSCULAR | Status: AC
  Administered 2021-04-18: 4 mg via INTRAVENOUS
  Filled 2021-04-18: qty 2

## 2021-04-18 NOTE — ED Triage Notes (Signed)
Patient brought in via ems from home. Patient states she has been having a "queezy" feeling for 2 weeks. Also states she has been feeling fatigue and weak. States her "equilibrium" is messed up. VSS A&Ox4

## 2021-04-18 NOTE — ED Provider Notes (Signed)
[place] DEPT Provider Note   CSN: [id number] Arrival date & time: 04/18/21  1026     History Chief Complaint  Patient presents with   Weakness    Monica Faulkner is a 80 y.o. female.  The history is provided by the patient.  Weakness Severity:  Mild Onset quality:  Gradual Timing:  Intermittent Progression:  Waxing and waning Chronicity:  New Context comment:  Has felt nausea and generalized weakness the last week or so. No pain Relieved by:  Nothing Worsened by:  Nothing Associated symptoms: nausea   Associated symptoms: no abdominal pain, no anorexia, no aphasia, no arthralgias, no chest pain, no cough, no dysuria, no fever, no seizures, no shortness of breath and no vomiting       Past Medical History:  Diagnosis Date   Anxiety    seen at [facility], for extreme anxiety. As a result of her anxiety  she had ECHO   Cancer ([place])    basal cell    Complication of anesthesia    atropine increased BP & increased pulse   Depression    Diabetes mellitus    type 2- 2006, managed with diet   GERD (gastroesophageal reflux disease)    rare occurence   H/O hiatal hernia    Headache(784.0)    caused by sinus congestion fr. time to time    Hypertension     Patient Active Problem List   Diagnosis Date Noted   Hyponatremia 07/02/2018   Macular hole 02/18/2012    Past Surgical History:  Procedure Laterality Date   ABDOMINAL HYSTERECTOMY     1991-scar later had to be further repaired    APPENDECTOMY     L ovarian cyst - ruptured & appendectomy done at same time.    EYE SURGERY     R eye- macular hole repair- 2008   GAS INSERTION  03/18/2012   Procedure: INSERTION OF GAS;  Surgeon: Hayden Pedro, MD;  Location: [place];  Service: Ophthalmology;  Laterality: Left;   HERNIA REPAIR     1974- inguinal repair   PARS PLANA VITRECTOMY  03/18/2012   Procedure: PARS PLANA VITRECTOMY WITH 25 GAUGE;  Surgeon: Hayden Pedro, MD;  Location: [place];   Service: Ophthalmology;  Laterality: Left;  25 GAUGE PPV FOR MACULAR HOLE   TEAR DUCT PROBING     as an infant   TONSILLECTOMY     and adenioidectomy- as a child   VITRECTOMY  03/18/2012     OB History   No obstetric history on file.     History reviewed. No pertinent family history.  Social History   Tobacco Use   Smoking status: Never   Smokeless tobacco: Never  Vaping Use   Vaping Use: Never used  Substance Use Topics   Alcohol use: No   Drug use: No    Home Medications Prior to Admission medications   Medication Sig Start Date End Date Taking? Authorizing Provider  atenolol (TENORMIN) 25 MG tablet Take 25 mg by mouth every evening.    [provider]  cholecalciferol (VITAMIN D) 1000 units tablet Take 1,000 Units by mouth daily.    [provider]  clonazePAM (KLONOPIN) 0.5 MG tablet Take 1 mg by mouth at bedtime. May repeat one tablet as needed for sleep 04/23/18   [provider]  cloNIDine (CATAPRES) 0.2 MG tablet Take 0.2 mg by mouth at bedtime.  04/23/18   [provider]  diphenhydrAMINE (BENADRYL) 25  mg capsule Take 25 mg by mouth at bedtime as needed for allergies.     [provider]  doxepin (SINEQUAN) 25 MG capsule Take 25 mg by mouth at bedtime as needed (sleep).  03/24/18   [provider]  escitalopram (LEXAPRO) 10 MG tablet Take 10 mg by mouth every morning.    [provider]  ibuprofen (ADVIL,MOTRIN) 200 MG tablet Take 200 mg by mouth daily as needed for fever or mild pain.     [provider]  JANUVIA 100 MG tablet Take 100 mg by mouth daily. 04/01/18   [provider]  lisinopril (PRINIVIL,ZESTRIL) 20 MG tablet Take 1 tablet (20 mg total) by mouth daily. 07/06/18   Alma Friendly, MD  Loperamide HCl (IMODIUM A-D PO) Take 2 tablets by mouth every hour as needed (diarrhea).    [provider]  naphazoline-pheniramine (NAPHCON-A) 0.025-0.3 % ophthalmic solution Place 1  drop into both eyes as needed for eye irritation (dry eyes).    [provider]  ondansetron (ZOFRAN) 8 MG tablet Take 1 tablet (8 mg total) by mouth every 8 (eight) hours as needed for nausea or vomiting. Patient not taking: Reported on 07/02/2018 04/28/18   Daleen Bo, MD  pravastatin (PRAVACHOL) 20 MG tablet Take 20 mg by mouth every other day.  02/24/18   [provider]  simethicone (MYLICON) 80 MG chewable tablet Chew 1 tablet (80 mg total) by mouth every 6 (six) hours as needed for flatulence. 07/06/18   Alma Friendly, MD  zolpidem (AMBIEN) 5 MG tablet Take 5 mg by mouth at bedtime as needed for sleep. 06/14/18   [provider]    Allergies    Avapro [irbesartan], Calcium channel blockers, Decongestant [pseudoephedrine], Norvasc [amlodipine besylate], and Sulfa antibiotics  Review of Systems   Review of Systems  Constitutional:  Negative for chills and fever.  HENT:  Negative for ear pain and sore throat.   Eyes:  Negative for pain and visual disturbance.  Respiratory:  Negative for cough and shortness of breath.   Cardiovascular:  Negative for chest pain and palpitations.  Gastrointestinal:  Positive for nausea. Negative for abdominal pain, anorexia and vomiting.  Genitourinary:  Negative for dysuria and hematuria.  Musculoskeletal:  Negative for arthralgias and back pain.  Skin:  Negative for color change and rash.  Neurological:  Positive for weakness. Negative for seizures and syncope.  Psychiatric/Behavioral:  The patient is nervous/anxious.   All other systems reviewed and are negative.  Physical Exam Updated Vital Signs BP (!) 181/61   Pulse (!) 56   Temp 98 F (36.7 C) (Oral)   Resp 12   Ht '5\' 5"'$  (1.651 m)   Wt 72.6 kg   SpO2 97%   BMI 26.63 kg/m   Physical Exam Vitals and nursing note reviewed.  Constitutional:      General: She is not in acute distress.    Appearance: She is well-developed. She is not ill-appearing.   HENT:     Head: Normocephalic and atraumatic.     Nose: Nose normal.     Mouth/Throat:     Mouth: Mucous membranes are moist.  Eyes:     Extraocular Movements: Extraocular movements intact.     Conjunctiva/sclera: Conjunctivae normal.     Pupils: Pupils are equal, round, and reactive to light.  Cardiovascular:     Rate and Rhythm: Normal rate and regular rhythm.     Pulses: Normal pulses.     Heart sounds:  Normal heart sounds. No murmur heard. Pulmonary:     Effort: Pulmonary effort is normal. No respiratory distress.     Breath sounds: Normal breath sounds.  Abdominal:     Palpations: Abdomen is soft.     Tenderness: There is no abdominal tenderness.  Musculoskeletal:     Cervical back: Normal range of motion and neck supple.  Skin:    General: Skin is warm and dry.  Neurological:     General: No focal deficit present.     Mental Status: She is alert and oriented to person, place, and time.     Cranial Nerves: No cranial nerve deficit.     Sensory: No sensory deficit.     Motor: No weakness.     Coordination: Coordination normal.     Comments: 5+ out of 5 strength throughout, normal sensation, no drift, normal finger-to-nose finger, normal speech    ED Results / Procedures / Treatments   Labs (all labs ordered are listed, but only abnormal results are displayed) Labs Reviewed  COMPREHENSIVE METABOLIC PANEL - Abnormal; Notable for the following components:      Result Value   Sodium 130 (*)    Chloride 96 (*)    Glucose, Bld 122 (*)    All other components within normal limits  URINALYSIS, ROUTINE W REFLEX MICROSCOPIC - Abnormal; Notable for the following components:   Color, Urine YELLOW (*)    APPearance CLEAR (*)    All other components within normal limits  RESP PANEL BY RT-PCR (FLU A&B, COVID) [id number]  CBC WITH DIFFERENTIAL/PLATELET  LIPASE, BLOOD    EKG EKG Interpretation  Date/Time:  Thursday April 18 2021 10:38:33 EDT Ventricular Rate:  59 PR  Interval:  172 QRS Duration: 94 QT Interval:  425 QTC Calculation: 421 R Axis:   61 Text Interpretation: Sinus rhythm Atrial premature complex Confirmed by Lennice Sites ([id number]) on 04/18/2021 10:47:40 AM  Radiology CT HEAD WO CONTRAST (5MM)  Result Date: 04/18/2021 CLINICAL DATA:  CT filling for 2 weeks, fatigue, disequilibrium EXAM: CT HEAD WITHOUT CONTRAST TECHNIQUE: Contiguous axial images were obtained from the base of the skull through the vertex without intravenous contrast. Sagittal and coronal MPR images reconstructed from axial data set. COMPARISON:  04/27/2009 FINDINGS: Brain: Mild atrophy. Normal ventricular morphology. No midline shift or mass effect. Small vessel chronic ischemic changes of deep cerebral white matter. No intracranial hemorrhage, mass lesion, or evidence of acute infarction. No extra-axial fluid collections. Vascular: No hyperdense vessels. Atherosclerotic calcifications of internal carotid arteries at skull base Skull: Intact Sinuses/Orbits: Opacified sphenoid sinus. Remaining visualized paranasal sinuses and mastoid air cells clear Other: N/A IMPRESSION: Atrophy with small vessel chronic ischemic changes of deep cerebral white matter. No acute intracranial abnormalities. Sphenoid sinus disease. Electronically Signed   By: Lavonia Dana M.D.   On: 04/18/2021 12:25   DG Chest Portable 1 View  Result Date: 04/18/2021 CLINICAL DATA:  Queasy and weakness for 2 weeks. EXAM: PORTABLE CHEST 1 VIEW COMPARISON:  March 18, 2012. FINDINGS: Marked curvature of the spine dextroconvexity in the midthoracic spine alters mediastinal contours with similar appearance to prior imaging. EKG leads project over the chest. Lungs are clear.  No effusion.  No visible pneumothorax. On limited assessment there is no acute skeletal process. IMPRESSION: No acute cardiopulmonary disease. Marked curvature of the spine dextroconvexity in the midthoracic spine. Electronically Signed   By: Zetta Bills M.D.    On: 04/18/2021 11:41    Procedures Procedures   Medications Ordered  in ED Medications  sodium chloride 0.9 % bolus 1,000 mL (1,000 mLs Intravenous New Bag/Given 04/18/21 1117)  ondansetron (ZOFRAN) injection 4 mg (4 mg Intravenous Given 04/18/21 1118)  clonazePAM (KLONOPIN) tablet 0.5 mg (0.5 mg Oral Given 04/18/21 1158)    ED Course  I have reviewed the triage vital signs and the nursing notes.  Pertinent labs & imaging results that were available during my care of the patient were reviewed by me and considered in my medical decision making (see chart for details).    MDM Rules/Calculators/A&P                           Monica Faulkner is here with generalized weakness.  History of anxiety, diabetes, depression.  States that she has felt generally weak with some intermittent nausea over the last 2 weeks.  Feels very anxious.  Denies any chest pain, shortness of breath, abdominal pain.  No vomiting or diarrhea.  Overall neurologically she is intact.  No abdominal tenderness on exam.  She has had some intermittent headaches as well.  Denies any fevers or chills.  Overall we will check for infectious process or electrolyte abnormality or dehydration.  This could be secondary to anxiety and nervous that she states that she does have extreme anxiety.  She would like a dose of her Klonopin.  We will pursue lab work and imaging and reevaluate.  Lab work showed no significant anemia, electrolyte abnormality, kidney injury, leukocytosis.  CT scan unremarkable.  No infection of urine or chest.  Felt better after fluids, Klonopin, Zofran.  Discharged in good condition.  Understands return precautions.  This chart was dictated using voice recognition software.  Despite best efforts to proofread,  errors can occur which can change the documentation meaning.   Final Clinical Impression(s) / ED Diagnoses Final diagnoses:  Weakness    Rx / DC Orders ED Discharge Orders     None        Lennice Sites, DO 04/18/21 1409

## 2021-04-18 NOTE — ED Notes (Signed)
Pt attempting to find a ride w/ her homecare agency at this time.

## 2021-04-18 NOTE — ED Notes (Signed)
Pt is in CT SCAN

## 2021-04-19 ENCOUNTER — Telehealth (HOSPITAL_COMMUNITY): Payer: Self-pay | Admitting: Student

## 2021-04-19 MED ORDER — ONDANSETRON HCL 4 MG PO TABS: 4.0000 mg | ORAL_TABLET | Freq: Four times a day (QID) | ORAL | 0 refills | Status: DC

## 2021-04-19 NOTE — Telephone Encounter (Signed)
Zofran sent to pharmacy as requested.

## 2021-04-20 ENCOUNTER — Emergency Department (HOSPITAL_COMMUNITY): Payer: Medicare PPO

## 2021-04-20 ENCOUNTER — Encounter (HOSPITAL_COMMUNITY): Payer: Self-pay

## 2021-04-20 ENCOUNTER — Emergency Department (HOSPITAL_COMMUNITY)
Admission: EM | Admit: 2021-04-20 | Discharge: 2021-04-20 | Disposition: A | Discharge status: Home / Self Care | Payer: Medicare PPO | Attending: Emergency Medicine | Admitting: Emergency Medicine

## 2021-04-20 ENCOUNTER — Other Ambulatory Visit: Payer: Self-pay

## 2021-04-20 DIAGNOSIS — K5730 Diverticulosis of large intestine without perforation or abscess without bleeding: Secondary | ICD-10-CM | POA: Diagnosis not present

## 2021-04-20 DIAGNOSIS — E119 Type 2 diabetes mellitus without complications: Secondary | ICD-10-CM | POA: Diagnosis not present

## 2021-04-20 DIAGNOSIS — D7389 Other diseases of spleen: Secondary | ICD-10-CM | POA: Diagnosis not present

## 2021-04-20 DIAGNOSIS — R197 Diarrhea, unspecified: Secondary | ICD-10-CM | POA: Insufficient documentation

## 2021-04-20 DIAGNOSIS — I10 Essential (primary) hypertension: Secondary | ICD-10-CM | POA: Diagnosis not present

## 2021-04-20 DIAGNOSIS — R42 Dizziness and giddiness: Secondary | ICD-10-CM | POA: Diagnosis not present

## 2021-04-20 DIAGNOSIS — R110 Nausea: Secondary | ICD-10-CM | POA: Diagnosis not present

## 2021-04-20 DIAGNOSIS — R6889 Other general symptoms and signs: Secondary | ICD-10-CM | POA: Diagnosis not present

## 2021-04-20 DIAGNOSIS — R519 Headache, unspecified: Secondary | ICD-10-CM | POA: Insufficient documentation

## 2021-04-20 DIAGNOSIS — K449 Diaphragmatic hernia without obstruction or gangrene: Secondary | ICD-10-CM | POA: Insufficient documentation

## 2021-04-20 DIAGNOSIS — Z79899 Other long term (current) drug therapy: Secondary | ICD-10-CM | POA: Insufficient documentation

## 2021-04-20 DIAGNOSIS — Z85828 Personal history of other malignant neoplasm of skin: Secondary | ICD-10-CM | POA: Diagnosis not present

## 2021-04-20 DIAGNOSIS — K8020 Calculus of gallbladder without cholecystitis without obstruction: Secondary | ICD-10-CM | POA: Diagnosis not present

## 2021-04-20 DIAGNOSIS — Z743 Need for continuous supervision: Secondary | ICD-10-CM | POA: Diagnosis not present

## 2021-04-20 DIAGNOSIS — R531 Weakness: Secondary | ICD-10-CM | POA: Diagnosis not present

## 2021-04-20 LAB — CBC
HCT: 38.7 % (ref 36.0–46.0)
Hemoglobin: 13.3 g/dL (ref 12.0–15.0)
MCH: 32.8 pg (ref 26.0–34.0)
MCHC: 34.4 g/dL (ref 30.0–36.0)
MCV: 95.3 fL (ref 80.0–100.0)
Platelets: 286 10*3/uL (ref 150–400)
RBC: 4.06 MIL/uL (ref 3.87–5.11)
RDW: 11.8 % (ref 11.5–15.5)
WBC: 8.7 10*3/uL (ref 4.0–10.5)
nRBC: 0 % (ref 0.0–0.2)

## 2021-04-20 LAB — COMPREHENSIVE METABOLIC PANEL
ALT: 13 U/L (ref 0–44)
AST: 19 U/L (ref 15–41)
Albumin: 4.1 g/dL (ref 3.5–5.0)
Alkaline Phosphatase: 57 U/L (ref 38–126)
Anion gap: 11 (ref 5–15)
BUN: 14 mg/dL (ref 8–23)
CO2: 26 mmol/L (ref 22–32)
Calcium: 9.5 mg/dL (ref 8.9–10.3)
Chloride: 95 mmol/L — ABNORMAL LOW (ref 98–111)
Creatinine, Ser: 0.86 mg/dL (ref 0.44–1.00)
GFR, Estimated: >60 mL/min (ref >60)
Glucose, Bld: 129 mg/dL — ABNORMAL HIGH (ref 70–99)
Potassium: 4.3 mmol/L (ref 3.5–5.1)
Sodium: 132 mmol/L — ABNORMAL LOW (ref 135–145)
Total Bilirubin: 0.9 mg/dL (ref 0.3–1.2)
Total Protein: 6.8 g/dL (ref 6.5–8.1)

## 2021-04-20 LAB — URINALYSIS, ROUTINE W REFLEX MICROSCOPIC
Bacteria, UA: NONE SEEN
Bilirubin Urine: NEGATIVE
Glucose, UA: NEGATIVE mg/dL
Ketones, ur: NEGATIVE mg/dL
Leukocytes,Ua: NEGATIVE
Nitrite: NEGATIVE
Protein, ur: NEGATIVE mg/dL
Specific Gravity, Urine: <1.005 — ABNORMAL LOW (ref 1.005–1.030)
pH: 7.5 (ref 5.0–8.0)

## 2021-04-20 LAB — LIPASE, BLOOD: Lipase: 34 U/L (ref 11–51)

## 2021-04-20 MED ORDER — ONDANSETRON 4 MG PO TBDP: 4.0000 mg | ORAL_TABLET | Freq: Three times a day (TID) | ORAL | 0 refills | Status: DC | PRN

## 2021-04-20 MED ORDER — LISINOPRIL 20 MG PO TABS
20.0000 mg | ORAL_TABLET | Freq: Once | ORAL | Status: AC
  Administered 2021-04-20: 20 mg via ORAL
  Filled 2021-04-20: qty 1

## 2021-04-20 MED ORDER — IOHEXOL 350 MG/ML SOLN
80.0000 mL | Freq: Once | INTRAVENOUS | Status: AC | PRN
  Administered 2021-04-20: 80 mL via INTRAVENOUS

## 2021-04-20 MED ORDER — FOSFOMYCIN TROMETHAMINE 3 G PO PACK: 3.0000 g | PACK | Freq: Once | ORAL | 0 refills | Status: DC

## 2021-04-20 MED ORDER — ONDANSETRON HCL 4 MG/2ML IJ SOLN
4.0000 mg | Freq: Once | INTRAMUSCULAR | Status: AC
  Administered 2021-04-20: 4 mg via INTRAVENOUS
  Filled 2021-04-20: qty 2

## 2021-04-20 MED ORDER — FOSFOMYCIN TROMETHAMINE 3 G PO PACK: 3.0000 g | PACK | Freq: Once | ORAL | Status: DC

## 2021-04-20 MED ORDER — SODIUM CHLORIDE 0.9 % IV BOLUS
1000.0000 mL | Freq: Once | INTRAVENOUS | Status: AC
  Administered 2021-04-20: 1000 mL via INTRAVENOUS

## 2021-04-20 NOTE — ED Notes (Signed)
Pt on phone attempting to get a ride home

## 2021-04-20 NOTE — ED Provider Notes (Addendum)
[place] DEPT Provider Note   CSN: [id number] Arrival date & time: 04/20/21  0917     History Chief Complaint  Patient presents with   Diarrhea    Monica Faulkner is a 80 y.o. female.   Diarrhea Associated symptoms: headaches   Associated symptoms: no abdominal pain, no fever and no vomiting    Patient presents with diarrhea.  Started last night after taking a laxative, its been consistent since then.  Laxative is a worsening factor, no alleviating factors.  No associated abdominal pain.  There is some associated lightheadedness as well, this started after having multiple episodes of diarrhea yesterday.  She reports having about 4-5 episodes today.  Patient also reports feeling weak for the last 2 weeks.  She is eating and drinking as normal.  She had multiple episodes of nausea, these have been alleviated with Zofran.  Patient was seen yesterday in the emergency department for the same complaint.  States she was unable to get her refill of Zofran yesterday due to the pharmacy system being down.  She states only thing it is different today is the diarrhea, notes the diarrhea did not start after taking a laxative last night.  Past Medical History:  Diagnosis Date   Anxiety    seen at [facility], for extreme anxiety. As a result of her anxiety  she had ECHO   Cancer ([place])    basal cell    Complication of anesthesia    atropine increased BP & increased pulse   Depression    Diabetes mellitus    type 2- 2006, managed with diet   GERD (gastroesophageal reflux disease)    rare occurence   H/O hiatal hernia    Headache(784.0)    caused by sinus congestion fr. time to time    Hypertension     Patient Active Problem List   Diagnosis Date Noted   Hyponatremia 07/02/2018   Macular hole 02/18/2012    Past Surgical History:  Procedure Laterality Date   ABDOMINAL HYSTERECTOMY     1991-scar later had to be further repaired    APPENDECTOMY     L  ovarian cyst - ruptured & appendectomy done at same time.    EYE SURGERY     R eye- macular hole repair- 2008   GAS INSERTION  03/18/2012   Procedure: INSERTION OF GAS;  Surgeon: Hayden Pedro, MD;  Location: [place];  Service: Ophthalmology;  Laterality: Left;   HERNIA REPAIR     1974- inguinal repair   PARS PLANA VITRECTOMY  03/18/2012   Procedure: PARS PLANA VITRECTOMY WITH 25 GAUGE;  Surgeon: Hayden Pedro, MD;  Location: [place];  Service: Ophthalmology;  Laterality: Left;  25 GAUGE PPV FOR MACULAR HOLE   TEAR DUCT PROBING     as an infant   TONSILLECTOMY     and adenioidectomy- as a child   VITRECTOMY  03/18/2012     OB History   No obstetric history on file.     History reviewed. No pertinent family history.  Social History   Tobacco Use   Smoking status: Never   Smokeless tobacco: Never  Vaping Use   Vaping Use: Never used  Substance Use Topics   Alcohol use: No   Drug use: No    Home Medications Prior to Admission medications   Medication Sig Start Date End Date Taking? Authorizing Provider  atenolol (TENORMIN) 25 MG tablet Take 25 mg by mouth every evening.  [provider]  cholecalciferol (VITAMIN D) 1000 units tablet Take 1,000 Units by mouth daily.    [provider]  clonazePAM (KLONOPIN) 0.5 MG tablet Take 1 mg by mouth at bedtime. May repeat one tablet as needed for sleep 04/23/18   [provider]  cloNIDine (CATAPRES) 0.2 MG tablet Take 0.2 mg by mouth at bedtime.  04/23/18   [provider]  diphenhydrAMINE (BENADRYL) 25 mg capsule Take 25 mg by mouth at bedtime as needed for allergies.     [provider]  doxepin (SINEQUAN) 25 MG capsule Take 25 mg by mouth at bedtime as needed (sleep).  03/24/18   [provider]  escitalopram (LEXAPRO) 10 MG tablet Take 10 mg by mouth every morning.    [provider]  ibuprofen (ADVIL,MOTRIN) 200 MG tablet Take 200 mg by mouth daily as needed for fever or mild  pain.     [provider]  JANUVIA 100 MG tablet Take 100 mg by mouth daily. 04/01/18   [provider]  lisinopril (PRINIVIL,ZESTRIL) 20 MG tablet Take 1 tablet (20 mg total) by mouth daily. 07/06/18   Alma Friendly, MD  Loperamide HCl (IMODIUM A-D PO) Take 2 tablets by mouth every hour as needed (diarrhea).    [provider]  naphazoline-pheniramine (NAPHCON-A) 0.025-0.3 % ophthalmic solution Place 1 drop into both eyes as needed for eye irritation (dry eyes).    [provider]  ondansetron (ZOFRAN) 4 MG tablet Take 1 tablet (4 mg total) by mouth every 6 (six) hours. 04/19/21   Sherrill Raring, PA-C  pravastatin (PRAVACHOL) 20 MG tablet Take 20 mg by mouth every other day.  02/24/18   [provider]  simethicone (MYLICON) 80 MG chewable tablet Chew 1 tablet (80 mg total) by mouth every 6 (six) hours as needed for flatulence. 07/06/18   Alma Friendly, MD  zolpidem (AMBIEN) 5 MG tablet Take 5 mg by mouth at bedtime as needed for sleep. 06/14/18   [provider]    Allergies    Avapro [irbesartan], Calcium channel blockers, Decongestant [pseudoephedrine], Norvasc [amlodipine besylate], and Sulfa antibiotics  Review of Systems   Review of Systems  Constitutional:  Negative for fatigue and fever.  Gastrointestinal:  Positive for diarrhea and nausea. Negative for abdominal pain and vomiting.  Genitourinary:  Negative for dysuria.  Neurological:  Positive for weakness and headaches. Negative for tremors.   Physical Exam Updated Vital Signs BP (!) 168/58   Pulse (!) 55   Temp 98.2 F (36.8 C) (Oral)   Resp 18   Ht '5\' 5"'$  (1.651 m)   Wt 72 kg   SpO2 97%   BMI 26.41 kg/m   Physical Exam Vitals and nursing note reviewed. Exam conducted with a chaperone present.  Constitutional:      Appearance: Normal appearance.     Comments: Resting comfortably, no acute distress.  HENT:     Head: Normocephalic and atraumatic.  Eyes:      General: No scleral icterus.       Right eye: No discharge.        Left eye: No discharge.     Extraocular Movements: Extraocular movements intact.     Pupils: Pupils are equal, round, and reactive to light.  Cardiovascular:     Rate and Rhythm: Normal rate and regular rhythm.     Pulses: Normal pulses.     Heart sounds: Normal heart sounds. No murmur heard.   No friction rub.  No gallop.  Pulmonary:     Effort: Pulmonary effort is normal. No respiratory distress.     Breath sounds: Normal breath sounds.  Abdominal:     General: Abdomen is flat. Bowel sounds are normal. There is no distension.     Palpations: Abdomen is soft.     Tenderness: There is no abdominal tenderness.     Comments: Abdomen is nontender, nondistended.  It is soft without any rigidity or guarding.  Skin:    General: Skin is warm and dry.     Coloration: Skin is not jaundiced.  Neurological:     Mental Status: She is alert. Mental status is at baseline.     Coordination: Coordination normal.     Comments: Cranial nerves III through XII are grossly intact.  Grip 6 equal bilaterally.  Finger-nose is normal.  No confusion, no dysarthria    ED Results / Procedures / Treatments   Labs (all labs ordered are listed, but only abnormal results are displayed) Labs Reviewed  COMPREHENSIVE METABOLIC PANEL - Abnormal; Notable for the following components:      Result Value   Sodium 132 (*)    Chloride 95 (*)    Glucose, Bld 129 (*)    All other components within normal limits  CBC  LIPASE, BLOOD  URINALYSIS, ROUTINE W REFLEX MICROSCOPIC    EKG None  Radiology No results found.  Procedures Procedures   Medications Ordered in ED Medications  iohexol (OMNIPAQUE) 350 MG/ML injection 80 mL (has no administration in time range)  ondansetron (ZOFRAN) injection 4 mg (4 mg Intravenous Given 04/20/21 1217)  sodium chloride 0.9 % bolus 1,000 mL (1,000 mLs Intravenous New Bag/Given 04/20/21 1238)    ED Course  I  have reviewed the triage vital signs and the nursing notes.  Pertinent labs & imaging results that were available during my care of the patient were reviewed by me and considered in my medical decision making (see chart for details).  Clinical Course as of 04/20/21 1502  Sat Apr 20, 2021  1421 Urinalysis, Routine w reflex microscopic Urine, Clean Catch(!) Not consistent with a UTI [HS]  1458 Comprehensive metabolic panel(!) No signs of renal impairment, no electrolyte derangement  [HS]    Clinical Course User Index [HS] Sherrill Raring, PA-C   MDM Rules/Calculators/A&P                           Patient is hypertensive, otherwise vitals are stable.  Not febrile, not tachycardic-not sepsis.  She seen yesterday from same complaint and the chart was reviewed.  CT head at the time did not show anything revealing, her lab work remains unchanged from yesterday.    I did order CT abdomen given that she had a new episode of diarrhea, it also showed no acute findings.  Suspect the diarrhea was likely due to starting the laxatives.  Her abdomen is soft, nontender I have very low suspicion for peritonitis or any surgical abdomen based on this and the CT scan.    There is no electrolyte derangement, no leukocytosis or anemia.  Patient overall reports feeling improved with fluids, I suspect the weakness is likely malaise.  Laboratory evidence does not suggest dehydration, she was slightly hyponatremic but not significantly so.  She also reports feeling significantly better with the fluids.  I will prescribe the patient some nausea medicine as there is an issue with the prescription yesterday, will also have her follow-up with  her primary care doctor next week.  Addendum: Patient's blood pressure became 202/74 prior to discharge.  Given that this is been trending upward during the ED stay, will give her a free dose of her home meds and observe to make sure it is trending downward.  She is not having any chest  pain or shortness of breath, no signs of renal impairment in her work-up today.  EKG without any acute findings.  I doubt the patient is having hypertensive urgency.  Care signed out to [facility].  Patient will likely be appropriate for discharge home once her blood pressure improves.   Final Clinical Impression(s) / ED Diagnoses Final diagnoses:  None    Rx / DC Orders ED Discharge Orders          Ordered    fosfomycin (MONUROL) 3 g PACK   Once,   Status:  Discontinued        04/20/21 [place], Leeam Cedrone, PA-C 04/20/21 [place], Manpreet Kemmer, PA-C 04/20/21 [place], [place], DO 04/20/21 1858

## 2021-04-20 NOTE — Discharge Instructions (Addendum)
Take Zofran every 8 hours as needed for nausea and vomiting.  I would abstain from taking laxatives for the time being.  Continue taking the rest of your home medicines.  Follow-up with your primary care doctor next week for reevaluation given you been feeling weak for the last few weeks and we did not see any cause for it in the emergency department. If things change or worsen return back to the ED for additional evaluation.

## 2021-04-20 NOTE — ED Notes (Signed)
Pt resting in bed. C/o feeling very anxious and shaking due to stress. States that she has not had an appeitie for several days and has not eaten since last night. Was awake all night with multiple episodes of diarrhea and nausea without vomiting  Pt states that she has felt very fatigued for a couple weeks due to increased anxiety and stress.

## 2021-04-20 NOTE — ED Triage Notes (Signed)
Pt BIB EMS c/o nausea, weakness, and diarrhea that started yesterday after taking a laxative for constipation. Pt states 6-7 loose stools since taking the medicine. Pt seen yesterday for the same symptoms, but was not able to fill the prescription due to the pharmacy systems being down.

## 2021-04-20 NOTE — ED Provider Notes (Addendum)
Emergency Medicine Provider Triage Evaluation Note  Monica Faulkner , a 80 y.o. female  was evaluated in triage.  Pt complains of nausea and diarrhea.  Patient here yesterday for nausea and weakness. She was discharged home after being medically cleared. We gave her a prescription for zofran, but drug store never received the order.  She started having diarrhea after taking laxative. Today patient feels worse than yesterday. She is having so much diarrhea that she is feeling weak and is shaking all over.   Review of Systems  Positive: Nausea, diarrhea, weakness Negative: Chest pain, dyspnea, abdominal pain   Physical Exam  BP (!) 150/66 (BP Location: Left Arm)   Pulse (!) 56   Temp 98.2 F (36.8 C) (Oral)   Resp 15   Ht '5\' 5"'$  (1.651 m)   Wt 72 kg   SpO2 100%   BMI 26.41 kg/m  Gen:   Awake, no distress. Patient is shivering. CV:   Normal heart sounds Resp:  Normal effort  MSK:   Moves extremities without difficulty  Abd:   Soft, nontender. BS active Other:    Medical Decision Making  Medically screening exam initiated at 10:12 AM.  Appropriate orders placed.  Monica Faulkner was informed that the remainder of the evaluation will be completed by another provider, this initial triage assessment does not replace that evaluation, and the importance of remaining in the ED until their evaluation is complete.    Monica Birchwood, PA-C 04/20/21 [address], PA-C 04/20/21 1027    Monica Birchwood, PA-C [id number]    Monica Faulkner P, DO [id number]

## 2021-04-25 DIAGNOSIS — F419 Anxiety disorder, unspecified: Secondary | ICD-10-CM | POA: Diagnosis not present

## 2021-04-30 ENCOUNTER — Emergency Department (HOSPITAL_COMMUNITY)
Admission: EM | Admit: 2021-04-30 | Discharge: 2021-04-30 | Disposition: A | Discharge status: Home / Self Care | Payer: Medicare PPO | Attending: Emergency Medicine | Admitting: Emergency Medicine

## 2021-04-30 ENCOUNTER — Other Ambulatory Visit: Payer: Self-pay

## 2021-04-30 ENCOUNTER — Encounter (HOSPITAL_COMMUNITY): Payer: Self-pay

## 2021-04-30 DIAGNOSIS — Z79899 Other long term (current) drug therapy: Secondary | ICD-10-CM | POA: Diagnosis not present

## 2021-04-30 DIAGNOSIS — R197 Diarrhea, unspecified: Secondary | ICD-10-CM | POA: Diagnosis not present

## 2021-04-30 DIAGNOSIS — R110 Nausea: Secondary | ICD-10-CM

## 2021-04-30 DIAGNOSIS — Z7984 Long term (current) use of oral hypoglycemic drugs: Secondary | ICD-10-CM | POA: Insufficient documentation

## 2021-04-30 DIAGNOSIS — Z85820 Personal history of malignant melanoma of skin: Secondary | ICD-10-CM | POA: Diagnosis not present

## 2021-04-30 DIAGNOSIS — R079 Chest pain, unspecified: Secondary | ICD-10-CM | POA: Diagnosis not present

## 2021-04-30 DIAGNOSIS — I10 Essential (primary) hypertension: Secondary | ICD-10-CM | POA: Insufficient documentation

## 2021-04-30 DIAGNOSIS — E119 Type 2 diabetes mellitus without complications: Secondary | ICD-10-CM | POA: Insufficient documentation

## 2021-04-30 DIAGNOSIS — R531 Weakness: Secondary | ICD-10-CM | POA: Diagnosis not present

## 2021-04-30 LAB — COMPREHENSIVE METABOLIC PANEL
ALT: 14 U/L (ref 0–44)
AST: 17 U/L (ref 15–41)
Albumin: 3.8 g/dL (ref 3.5–5.0)
Alkaline Phosphatase: 60 U/L (ref 38–126)
Anion gap: 9 (ref 5–15)
BUN: 15 mg/dL (ref 8–23)
CO2: 30 mmol/L (ref 22–32)
Calcium: 9.4 mg/dL (ref 8.9–10.3)
Chloride: 91 mmol/L — ABNORMAL LOW (ref 98–111)
Creatinine, Ser: 0.87 mg/dL (ref 0.44–1.00)
GFR, Estimated: >60 mL/min (ref >60)
Glucose, Bld: 98 mg/dL (ref 70–99)
Potassium: 3.9 mmol/L (ref 3.5–5.1)
Sodium: 130 mmol/L — ABNORMAL LOW (ref 135–145)
Total Bilirubin: 0.6 mg/dL (ref 0.3–1.2)
Total Protein: 6.8 g/dL (ref 6.5–8.1)

## 2021-04-30 LAB — MAGNESIUM: Magnesium: 1.8 mg/dL (ref 1.7–2.4)

## 2021-04-30 LAB — LIPASE, BLOOD: Lipase: 48 U/L (ref 11–51)

## 2021-04-30 LAB — TROPONIN I (HIGH SENSITIVITY): Troponin I (High Sensitivity): 4 ng/L (ref <18)

## 2021-04-30 MED ORDER — FAMOTIDINE 40 MG PO TABS: 40.0000 mg | ORAL_TABLET | Freq: Every day | ORAL | 0 refills | Status: DC

## 2021-04-30 MED ORDER — METOCLOPRAMIDE HCL 10 MG PO TABS: 10.0000 mg | ORAL_TABLET | Freq: Three times a day (TID) | ORAL | 0 refills | Status: DC

## 2021-04-30 MED ORDER — ALUM & MAG HYDROXIDE-SIMETH 200-200-20 MG/5ML PO SUSP
30.0000 mL | Freq: Once | ORAL | Status: AC
  Administered 2021-04-30: 30 mL via ORAL
  Filled 2021-04-30: qty 30

## 2021-04-30 MED ORDER — HYOSCYAMINE SULFATE 0.125 MG SL SUBL
0.2500 mg | SUBLINGUAL_TABLET | Freq: Once | SUBLINGUAL | Status: AC
  Administered 2021-04-30: 0.25 mg via SUBLINGUAL
  Filled 2021-04-30: qty 2

## 2021-04-30 MED ORDER — LACTATED RINGERS IV BOLUS
1000.0000 mL | Freq: Once | INTRAVENOUS | Status: AC
  Administered 2021-04-30: 1000 mL via INTRAVENOUS

## 2021-04-30 MED ORDER — ONDANSETRON HCL 4 MG PO TABS
8.0000 mg | ORAL_TABLET | Freq: Once | ORAL | Status: AC
  Administered 2021-04-30: 8 mg via ORAL
  Filled 2021-04-30: qty 2

## 2021-04-30 NOTE — Discharge Instructions (Addendum)
Ms Monica Faulkner you came in for nausea and diarrhea. We did labs on your which showed that you did not have any serious problem or heart attack. We have you medications for these symptoms and they improved. Please stop taking your tongue medication and start taking Reglan and Pepcid for these symptoms. Also CALL [facility] for an outpatient appt (number in this packet) to follow up and CALL your regular doctor for an appointment.

## 2021-04-30 NOTE — ED Triage Notes (Signed)
Patient brought in via ems from home. Patient states she has been having upset stomach, nausea, and diarrhea for 2 days. States she feels dizzy when standing. Poor appetitie

## 2021-04-30 NOTE — ED Provider Notes (Signed)
[facility] DEPT Provider Note   CSN: [id number] Arrival date & time: 04/30/21  [id number]     History Chief Complaint  Patient presents with   Nausea   Diarrhea    Monica Faulkner is a 80 y.o. female presenting to the ED with complaints of nausea x 5 days and diarrhea x 3 days. She complains of a couple episodes of diarrhea for the past couple of days and 1 episode today followed by weakness, prompting her to come to the ED. Of note, pt has presented to the ED with similar symptoms in past 10 days, with non-concerning findings. She last used a laxative 5 days ago. She states she has had poor oral intake over the past several days due to nausea. She does complain of vague chest pain upon prompting.    Diarrhea Associated symptoms: no abdominal pain, no arthralgias, no chills, no diaphoresis, no fever and no headaches       Past Medical History:  Diagnosis Date   Anxiety    seen at [facility], for extreme anxiety. As a result of her anxiety  she had ECHO   Cancer ([place])    basal cell    Complication of anesthesia    atropine increased BP & increased pulse   Depression    Diabetes mellitus    type 2- 2006, managed with diet   GERD (gastroesophageal reflux disease)    rare occurence   H/O hiatal hernia    Headache(784.0)    caused by sinus congestion fr. time to time    Hypertension     Patient Active Problem List   Diagnosis Date Noted   Hyponatremia 07/02/2018   Macular hole 02/18/2012    Past Surgical History:  Procedure Laterality Date   ABDOMINAL HYSTERECTOMY     1991-scar later had to be further repaired    APPENDECTOMY     L ovarian cyst - ruptured & appendectomy done at same time.    EYE SURGERY     R eye- macular hole repair- 2008   GAS INSERTION  03/18/2012   Procedure: INSERTION OF GAS;  Surgeon: Hayden Pedro, MD;  Location: [facility];  Service: Ophthalmology;  Laterality: Left;   HERNIA REPAIR     1974- inguinal repair   PARS PLANA  VITRECTOMY  03/18/2012   Procedure: PARS PLANA VITRECTOMY WITH 25 GAUGE;  Surgeon: Hayden Pedro, MD;  Location: [facility];  Service: Ophthalmology;  Laterality: Left;  25 GAUGE PPV FOR MACULAR HOLE   TEAR DUCT PROBING     as an infant   TONSILLECTOMY     and adenioidectomy- as a child   VITRECTOMY  03/18/2012     OB History   No obstetric history on file.     History reviewed. No pertinent family history.  Social History   Tobacco Use   Smoking status: Never   Smokeless tobacco: Never  Vaping Use   Vaping Use: Never used  Substance Use Topics   Alcohol use: No   Drug use: No    Home Medications Prior to Admission medications   Medication Sig Start Date End Date Taking? Authorizing Provider  atenolol (TENORMIN) 25 MG tablet Take 25 mg by mouth every evening.   Yes [provider]  cholecalciferol (VITAMIN D) 1000 units tablet Take 2,000 Units by mouth daily.   Yes [provider]  clonazePAM (KLONOPIN) 0.5 MG tablet Take 1 mg by mouth at bedtime. May take additional '1mg'$  as needed  for sleep 04/23/18  Yes [provider]  cloNIDine (CATAPRES) 0.2 MG tablet Take 0.2 mg by mouth at bedtime.  04/23/18  Yes [provider]  diphenhydrAMINE (BENADRYL) 25 mg capsule Take 25 mg by mouth at bedtime as needed for allergies.    Yes [provider]  escitalopram (LEXAPRO) 10 MG tablet Take 10 mg by mouth every morning.   Yes [provider]  ibuprofen (ADVIL,MOTRIN) 200 MG tablet Take 200 mg by mouth daily as needed for fever or mild pain.    Yes [provider]  JANUVIA 100 MG tablet Take 100 mg by mouth daily. 04/01/18  Yes [provider]  Loperamide HCl (IMODIUM A-D PO) Take 2 tablets by mouth every hour as needed (diarrhea).   Yes [provider]  Naphazoline HCl (CLEAR EYES OP) Place 1 drop into both eyes daily as needed (dry eyes).   Yes [provider]  ondansetron (ZOFRAN) 4 MG tablet Take 1 tablet  (4 mg total) by mouth every 6 (six) hours. Patient taking differently: Take 4 mg by mouth every 6 (six) hours as needed for nausea or vomiting. 04/19/21  Yes Sherrill Raring, PA-C  pravastatin (PRAVACHOL) 20 MG tablet Take 20 mg by mouth every other day.  02/24/18  Yes [provider]  telmisartan (MICARDIS) 80 MG tablet Take 80 mg by mouth daily. 04/17/21  Yes [provider]  zolpidem (AMBIEN) 5 MG tablet Take 5 mg by mouth at bedtime as needed for sleep. 06/14/18  Yes [provider]  famotidine (PEPCID) 40 MG tablet Take 1 tablet (40 mg total) by mouth daily for 15 days. 04/30/21 05/15/21 Yes Lajean Manes, MD  metoCLOPramide (REGLAN) 10 MG tablet Take 1 tablet (10 mg total) by mouth 3 (three) times daily before meals for 10 days. 04/30/21 05/10/21 Yes Lajean Manes, MD    Allergies    Avapro [irbesartan], Calcium channel blockers, Decongestant [pseudoephedrine], Norvasc [amlodipine besylate], Sulfa antibiotics, and Zofran [ondansetron]  Review of Systems   Review of Systems  Constitutional:  Positive for fatigue. Negative for chills, diaphoresis and fever.  Respiratory:  Negative for chest tightness, shortness of breath and wheezing.   Cardiovascular:  Positive for chest pain. Negative for palpitations and leg swelling.  Gastrointestinal:  Positive for diarrhea. Negative for abdominal distention and abdominal pain.  Genitourinary:  Negative for dysuria.  Musculoskeletal:  Negative for arthralgias and joint swelling.  Neurological:  Positive for syncope, weakness and light-headedness. Negative for dizziness, tremors and headaches.  All other systems reviewed and are negative.  Physical Exam Updated Vital Signs BP (!) 181/66 (BP Location: Right Arm)   Pulse (!) 59   Temp 98.7 F (37.1 C) (Oral)   Resp 14   Ht '5\' 5"'$  (1.651 m)   Wt 73.5 kg   SpO2 98%   BMI 26.96 kg/m   Physical Exam Constitutional:      Appearance: Normal appearance. She is normal weight. She is  not ill-appearing.  HENT:     Head: Normocephalic and atraumatic.  Eyes:     Extraocular Movements: Extraocular movements intact.  Cardiovascular:     Rate and Rhythm: Normal rate and regular rhythm.     Pulses: Normal pulses.     Heart sounds: Normal heart sounds.  Pulmonary:     Effort: Pulmonary effort is normal.     Breath sounds: Normal breath sounds.  Abdominal:     General: Abdomen is flat.  Musculoskeletal:     Cervical back: Normal  range of motion.  Skin:    General: Skin is warm and dry.  Neurological:     General: No focal deficit present.     Mental Status: She is alert and oriented to person, place, and time. Mental status is at baseline.  Psychiatric:        Mood and Affect: Mood normal.        Behavior: Behavior normal.    ED Results / Procedures / Treatments   Labs (all labs ordered are listed, but only abnormal results are displayed) Labs Reviewed  COMPREHENSIVE METABOLIC PANEL - Abnormal; Notable for the following components:      Result Value   Sodium 130 (*)    Chloride 91 (*)    All other components within normal limits  MAGNESIUM  LIPASE, BLOOD  TROPONIN I (HIGH SENSITIVITY)    EKG None  Radiology No results found.  Procedures Procedures   Medications Ordered in ED Medications  alum & mag hydroxide-simeth (MAALOX/MYLANTA) 200-200-20 MG/5ML suspension 30 mL (30 mLs Oral Given 04/30/21 1045)  hyoscyamine (LEVSIN SL) SL tablet 0.25 mg (0.25 mg Sublingual Given 04/30/21 1045)  lactated ringers bolus 1,000 mL (1,000 mLs Intravenous New Bag/Given 04/30/21 1045)  ondansetron (ZOFRAN) tablet 8 mg (8 mg Oral Given 04/30/21 1046)    ED Course  I have reviewed the triage vital signs and the nursing notes.  Pertinent labs & imaging results that were available during my care of the patient were reviewed by me and considered in my medical decision making (see chart for details).    MDM Rules/Calculators/A&P                            Vitals  stable. SBP 170-180s, but remains asymptomatic without headaches, changes in vision, or focal neurological deficits. Lipase and Mag wnl. Heart Score 3, low risk for ACS and trop negative. CMP with Na 130, likely 2/2 dehydration; gave IV fluids and expect improvement with continued PO intake. Clinical improvement in nausea and diarrhea with no chest pain, after zofran and GI cocktail. TOC consulted for home health. Pt discharged with antiemetic and pepcid with instructions to follow up outpatient with GI and to make appt with PCP. Instructed to return to the ED if chest pain returns or persists, abdominal pain and loss of consciousness.   Final Clinical Impression(s) / ED Diagnoses Final diagnoses:  Nausea  Diarrhea, unspecified type    Rx / DC Orders ED Discharge Orders          [place]        04/30/21 1226    Face-to-face encounter (required for Medicare/Medicaid patients)       Comments: I Cecilio Asper certify that this patient is under my care and that I, or a nurse practitioner or physician's assistant working with me, had a face-to-face encounter that meets the physician face-to-face encounter requirements with this patient on 04/30/2021. The encounter with the patient was in whole, or in part for the following medical condition(s) which is the primary reason for home health care (List medical condition): Progressive weakness unable to leave her house without significant assistance.   04/30/21 1226    metoCLOPramide (REGLAN) 10 MG tablet  3 times daily before meals        04/30/21 1419    famotidine (PEPCID) 40 MG tablet  Daily        04/30/21 1419  Lajean Manes, MD 04/30/21 [place], [place], DO 04/30/21 1521

## 2021-04-30 NOTE — Progress Notes (Signed)
.  Transition of Care [facility]) - Emergency Department Mini Assessment   Patient Details  Name: Monica Faulkner MRN: [id number] Date of Birth: [birth year]  Transition of Care [facility]) CM/SW Contact:    Illene Regulus, LCSW Phone Number: 04/30/2021, 1:21 PM   Clinical Narrative: CSW spoke with pt, pt stated she lives alone. Pt stated she has a brother that lives in [place]. Pt stated she does not have any family in the area, although she does get help from a private duty care agency [facility]. The agency helps pt with groceries, cleaning, and transportation. Pt stated she uses a walker and cane and does not need any other DME. Pt stated she does not need HH due to [facility] being involved. CSW will contact [facility] for a ride when pt is ready for d/c.   Arlie Solomons.Sohrab Keelan, MSW, [place]  Transitions of Care Clinical Social Worker I Direct Dial: [phone]  Fax: [phone] [email or web address]    ED Mini Assessment: What brought you to the Emergency Department? : upset stomach, nausea, and diarrhea  Barriers to Discharge: Continued Medical Work up             Patient Contact and Communications        ,                 Admission diagnosis:  diarrhea;nausea Patient Active Problem List   Diagnosis Date Noted   Hyponatremia 07/02/2018   Macular hole 02/18/2012   PCP:  Merrilee Seashore, MD Pharmacy:   [facility] [place], [place] [address] [place] [address] Phone: [phone] Fax: [phone]

## 2021-04-30 NOTE — Discharge Planning (Signed)
I have recommended that this patient have a home health services but she declines at this time because she has private duty caregivers. I have discussed the risks and benefits of home health with her. The patient verbalizes understanding.

## 2021-06-13 DIAGNOSIS — F419 Anxiety disorder, unspecified: Secondary | ICD-10-CM | POA: Diagnosis not present

## 2021-06-19 DIAGNOSIS — E1122 Type 2 diabetes mellitus with diabetic chronic kidney disease: Secondary | ICD-10-CM | POA: Diagnosis not present

## 2021-06-19 DIAGNOSIS — E871 Hypo-osmolality and hyponatremia: Secondary | ICD-10-CM | POA: Diagnosis not present

## 2021-06-19 DIAGNOSIS — E785 Hyperlipidemia, unspecified: Secondary | ICD-10-CM | POA: Diagnosis not present

## 2021-06-26 DIAGNOSIS — E785 Hyperlipidemia, unspecified: Secondary | ICD-10-CM | POA: Diagnosis not present

## 2021-06-26 DIAGNOSIS — N182 Chronic kidney disease, stage 2 (mild): Secondary | ICD-10-CM | POA: Diagnosis not present

## 2021-06-26 DIAGNOSIS — F419 Anxiety disorder, unspecified: Secondary | ICD-10-CM | POA: Diagnosis not present

## 2021-06-26 DIAGNOSIS — Z23 Encounter for immunization: Secondary | ICD-10-CM | POA: Diagnosis not present

## 2021-06-26 DIAGNOSIS — E1122 Type 2 diabetes mellitus with diabetic chronic kidney disease: Secondary | ICD-10-CM | POA: Diagnosis not present

## 2021-08-16 DIAGNOSIS — E785 Hyperlipidemia, unspecified: Secondary | ICD-10-CM | POA: Diagnosis not present

## 2021-08-16 DIAGNOSIS — E1122 Type 2 diabetes mellitus with diabetic chronic kidney disease: Secondary | ICD-10-CM | POA: Diagnosis not present

## 2021-08-16 DIAGNOSIS — I129 Hypertensive chronic kidney disease with stage 1 through stage 4 chronic kidney disease, or unspecified chronic kidney disease: Secondary | ICD-10-CM | POA: Diagnosis not present

## 2021-08-16 DIAGNOSIS — N182 Chronic kidney disease, stage 2 (mild): Secondary | ICD-10-CM | POA: Diagnosis not present

## 2021-11-18 DIAGNOSIS — F419 Anxiety disorder, unspecified: Secondary | ICD-10-CM | POA: Diagnosis not present

## 2021-11-27 DIAGNOSIS — E785 Hyperlipidemia, unspecified: Secondary | ICD-10-CM | POA: Diagnosis not present

## 2021-11-27 DIAGNOSIS — N182 Chronic kidney disease, stage 2 (mild): Secondary | ICD-10-CM | POA: Diagnosis not present

## 2021-11-27 DIAGNOSIS — R5383 Other fatigue: Secondary | ICD-10-CM | POA: Diagnosis not present

## 2021-11-27 DIAGNOSIS — E1122 Type 2 diabetes mellitus with diabetic chronic kidney disease: Secondary | ICD-10-CM | POA: Diagnosis not present

## 2021-12-04 DIAGNOSIS — Z0000 Encounter for general adult medical examination without abnormal findings: Secondary | ICD-10-CM | POA: Diagnosis not present

## 2021-12-04 DIAGNOSIS — T148XXA Other injury of unspecified body region, initial encounter: Secondary | ICD-10-CM | POA: Diagnosis not present

## 2021-12-04 DIAGNOSIS — I129 Hypertensive chronic kidney disease with stage 1 through stage 4 chronic kidney disease, or unspecified chronic kidney disease: Secondary | ICD-10-CM | POA: Diagnosis not present

## 2021-12-04 DIAGNOSIS — E1122 Type 2 diabetes mellitus with diabetic chronic kidney disease: Secondary | ICD-10-CM | POA: Diagnosis not present

## 2021-12-04 DIAGNOSIS — N182 Chronic kidney disease, stage 2 (mild): Secondary | ICD-10-CM | POA: Diagnosis not present

## 2021-12-04 DIAGNOSIS — M810 Age-related osteoporosis without current pathological fracture: Secondary | ICD-10-CM | POA: Diagnosis not present

## 2021-12-04 DIAGNOSIS — F331 Major depressive disorder, recurrent, moderate: Secondary | ICD-10-CM | POA: Diagnosis not present

## 2021-12-04 DIAGNOSIS — E785 Hyperlipidemia, unspecified: Secondary | ICD-10-CM | POA: Diagnosis not present

## 2022-02-17 DIAGNOSIS — F419 Anxiety disorder, unspecified: Secondary | ICD-10-CM | POA: Diagnosis not present

## 2022-02-21 ENCOUNTER — Encounter (INDEPENDENT_AMBULATORY_CARE_PROVIDER_SITE_OTHER): Payer: Medicare PPO | Admitting: Ophthalmology

## 2022-02-21 DIAGNOSIS — H35343 Macular cyst, hole, or pseudohole, bilateral: Secondary | ICD-10-CM

## 2022-02-21 DIAGNOSIS — H35033 Hypertensive retinopathy, bilateral: Secondary | ICD-10-CM

## 2022-02-21 DIAGNOSIS — I10 Essential (primary) hypertension: Secondary | ICD-10-CM

## 2022-06-11 DIAGNOSIS — N182 Chronic kidney disease, stage 2 (mild): Secondary | ICD-10-CM | POA: Diagnosis not present

## 2022-06-11 DIAGNOSIS — I129 Hypertensive chronic kidney disease with stage 1 through stage 4 chronic kidney disease, or unspecified chronic kidney disease: Secondary | ICD-10-CM | POA: Diagnosis not present

## 2022-06-11 DIAGNOSIS — E1122 Type 2 diabetes mellitus with diabetic chronic kidney disease: Secondary | ICD-10-CM | POA: Diagnosis not present

## 2022-06-18 DIAGNOSIS — E1122 Type 2 diabetes mellitus with diabetic chronic kidney disease: Secondary | ICD-10-CM | POA: Diagnosis not present

## 2022-06-18 DIAGNOSIS — N182 Chronic kidney disease, stage 2 (mild): Secondary | ICD-10-CM | POA: Diagnosis not present

## 2022-06-18 DIAGNOSIS — Z23 Encounter for immunization: Secondary | ICD-10-CM | POA: Diagnosis not present

## 2022-06-18 DIAGNOSIS — E785 Hyperlipidemia, unspecified: Secondary | ICD-10-CM | POA: Diagnosis not present

## 2022-06-18 DIAGNOSIS — I129 Hypertensive chronic kidney disease with stage 1 through stage 4 chronic kidney disease, or unspecified chronic kidney disease: Secondary | ICD-10-CM | POA: Diagnosis not present

## 2022-06-18 DIAGNOSIS — F331 Major depressive disorder, recurrent, moderate: Secondary | ICD-10-CM | POA: Diagnosis not present

## 2022-06-18 DIAGNOSIS — M810 Age-related osteoporosis without current pathological fracture: Secondary | ICD-10-CM | POA: Diagnosis not present

## 2022-06-23 DIAGNOSIS — F419 Anxiety disorder, unspecified: Secondary | ICD-10-CM | POA: Diagnosis not present

## 2022-10-21 DIAGNOSIS — F419 Anxiety disorder, unspecified: Secondary | ICD-10-CM | POA: Diagnosis not present

## 2022-12-10 DIAGNOSIS — I129 Hypertensive chronic kidney disease with stage 1 through stage 4 chronic kidney disease, or unspecified chronic kidney disease: Secondary | ICD-10-CM | POA: Diagnosis not present

## 2022-12-10 DIAGNOSIS — R5383 Other fatigue: Secondary | ICD-10-CM | POA: Diagnosis not present

## 2022-12-10 DIAGNOSIS — E785 Hyperlipidemia, unspecified: Secondary | ICD-10-CM | POA: Diagnosis not present

## 2022-12-10 DIAGNOSIS — E1122 Type 2 diabetes mellitus with diabetic chronic kidney disease: Secondary | ICD-10-CM | POA: Diagnosis not present

## 2022-12-10 DIAGNOSIS — M810 Age-related osteoporosis without current pathological fracture: Secondary | ICD-10-CM | POA: Diagnosis not present

## 2022-12-10 DIAGNOSIS — F331 Major depressive disorder, recurrent, moderate: Secondary | ICD-10-CM | POA: Diagnosis not present

## 2022-12-10 DIAGNOSIS — N182 Chronic kidney disease, stage 2 (mild): Secondary | ICD-10-CM | POA: Diagnosis not present

## 2022-12-17 DIAGNOSIS — E1122 Type 2 diabetes mellitus with diabetic chronic kidney disease: Secondary | ICD-10-CM | POA: Diagnosis not present

## 2022-12-17 DIAGNOSIS — F331 Major depressive disorder, recurrent, moderate: Secondary | ICD-10-CM | POA: Diagnosis not present

## 2022-12-17 DIAGNOSIS — E785 Hyperlipidemia, unspecified: Secondary | ICD-10-CM | POA: Diagnosis not present

## 2022-12-17 DIAGNOSIS — I129 Hypertensive chronic kidney disease with stage 1 through stage 4 chronic kidney disease, or unspecified chronic kidney disease: Secondary | ICD-10-CM | POA: Diagnosis not present

## 2022-12-17 DIAGNOSIS — N182 Chronic kidney disease, stage 2 (mild): Secondary | ICD-10-CM | POA: Diagnosis not present

## 2022-12-17 DIAGNOSIS — Z0000 Encounter for general adult medical examination without abnormal findings: Secondary | ICD-10-CM | POA: Diagnosis not present

## 2022-12-17 DIAGNOSIS — M810 Age-related osteoporosis without current pathological fracture: Secondary | ICD-10-CM | POA: Diagnosis not present

## 2022-12-17 DIAGNOSIS — Z23 Encounter for immunization: Secondary | ICD-10-CM | POA: Diagnosis not present

## 2023-02-27 ENCOUNTER — Encounter (INDEPENDENT_AMBULATORY_CARE_PROVIDER_SITE_OTHER): Payer: Medicare PPO | Admitting: Ophthalmology

## 2023-04-29 ENCOUNTER — Encounter (INDEPENDENT_AMBULATORY_CARE_PROVIDER_SITE_OTHER): Payer: Medicare PPO | Admitting: Ophthalmology

## 2023-05-04 ENCOUNTER — Encounter (INDEPENDENT_AMBULATORY_CARE_PROVIDER_SITE_OTHER): Payer: Medicare PPO | Admitting: Ophthalmology

## 2023-06-17 DIAGNOSIS — F419 Anxiety disorder, unspecified: Secondary | ICD-10-CM | POA: Diagnosis not present

## 2023-06-24 DIAGNOSIS — N182 Chronic kidney disease, stage 2 (mild): Secondary | ICD-10-CM | POA: Diagnosis not present

## 2023-06-24 DIAGNOSIS — I129 Hypertensive chronic kidney disease with stage 1 through stage 4 chronic kidney disease, or unspecified chronic kidney disease: Secondary | ICD-10-CM | POA: Diagnosis not present

## 2023-06-24 DIAGNOSIS — E1122 Type 2 diabetes mellitus with diabetic chronic kidney disease: Secondary | ICD-10-CM | POA: Diagnosis not present

## 2023-06-24 DIAGNOSIS — E785 Hyperlipidemia, unspecified: Secondary | ICD-10-CM | POA: Diagnosis not present

## 2023-07-01 DIAGNOSIS — I129 Hypertensive chronic kidney disease with stage 1 through stage 4 chronic kidney disease, or unspecified chronic kidney disease: Secondary | ICD-10-CM | POA: Diagnosis not present

## 2023-07-01 DIAGNOSIS — M810 Age-related osteoporosis without current pathological fracture: Secondary | ICD-10-CM | POA: Diagnosis not present

## 2023-07-01 DIAGNOSIS — Z23 Encounter for immunization: Secondary | ICD-10-CM | POA: Diagnosis not present

## 2023-07-01 DIAGNOSIS — E1122 Type 2 diabetes mellitus with diabetic chronic kidney disease: Secondary | ICD-10-CM | POA: Diagnosis not present

## 2023-07-01 DIAGNOSIS — E785 Hyperlipidemia, unspecified: Secondary | ICD-10-CM | POA: Diagnosis not present

## 2023-07-01 DIAGNOSIS — N182 Chronic kidney disease, stage 2 (mild): Secondary | ICD-10-CM | POA: Diagnosis not present

## 2023-07-01 DIAGNOSIS — F331 Major depressive disorder, recurrent, moderate: Secondary | ICD-10-CM | POA: Diagnosis not present

## 2023-08-01 IMAGING — CT CT ABD-PELV W/ CM
2 of 5 series · 16 of 46 positions shown, 18 images · IV contrast (omnipaque)
Comparison: Abdominopelvic CT 04/29/2018.

CLINICAL DATA: Abdominal distension with diarrhea. History of
appendectomy, hysterectomy and hernia repair.

EXAM:
CT ABDOMEN AND PELVIS WITH CONTRAST
TECHNIQUE: Multidetector CT imaging of the abdomen and pelvis was performed
using the standard protocol following bolus administration of
intravenous contrast.
CONTRAST:  80mL OMNIPAQUE IOHEXOL 350 MG/ML SOLN

[Series 2: axial st · axial · 0.83mm/px · z∈[+1104,+1444]mm · 13 of 80 slices shown, 15 images]
[im 6/80  soft-tissue]
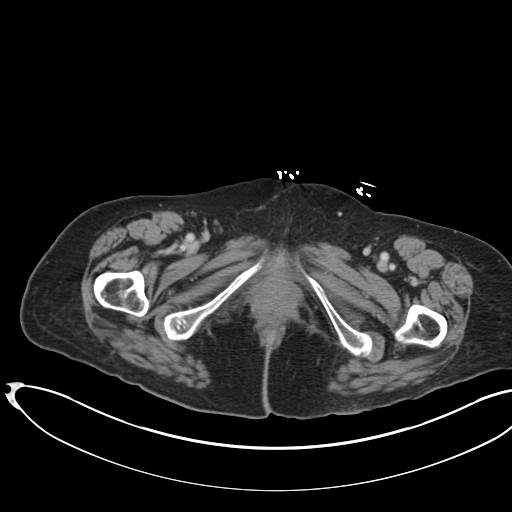
[im 6/80  bone]
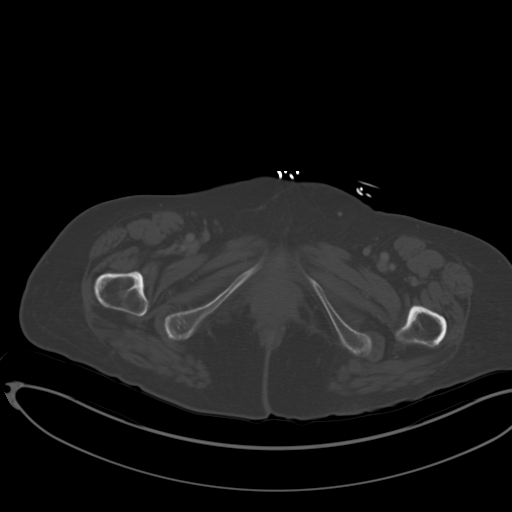
[im 11/80  soft-tissue]
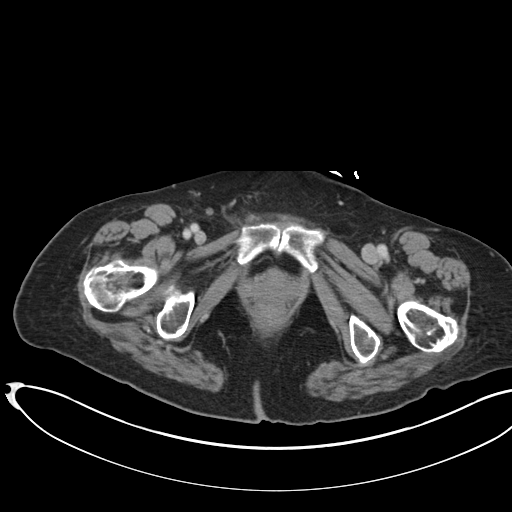
[im 16/80  soft-tissue]
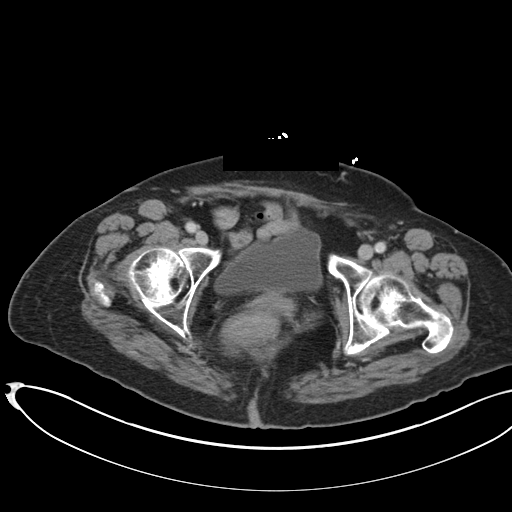
[im 22/80  soft-tissue]
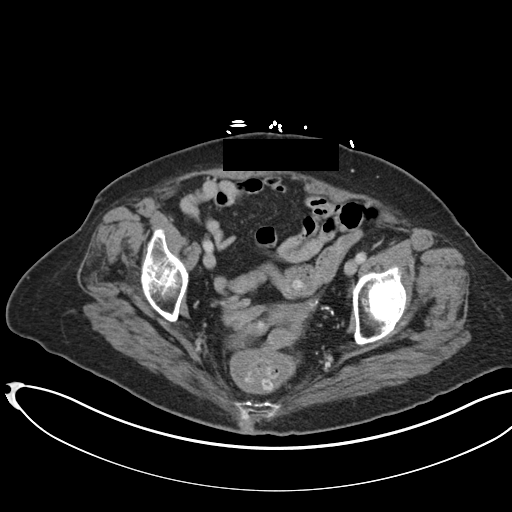
[im 27/80  soft-tissue]
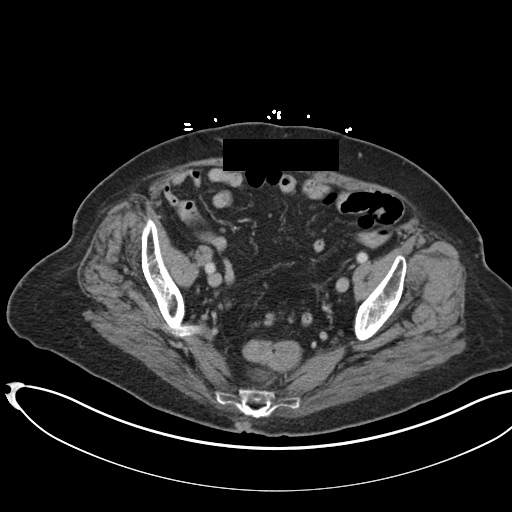
[im 32/80  soft-tissue]
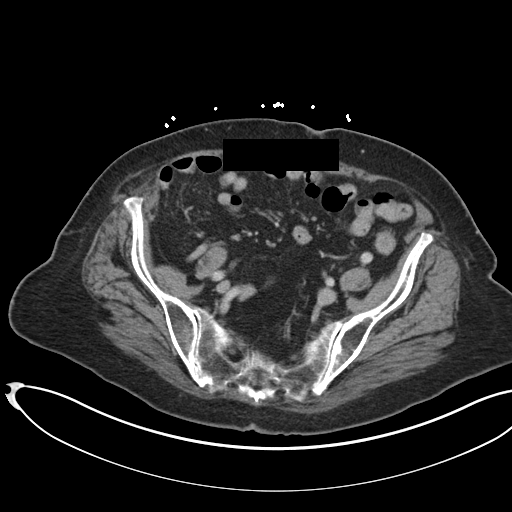
[im 43/80  soft-tissue]
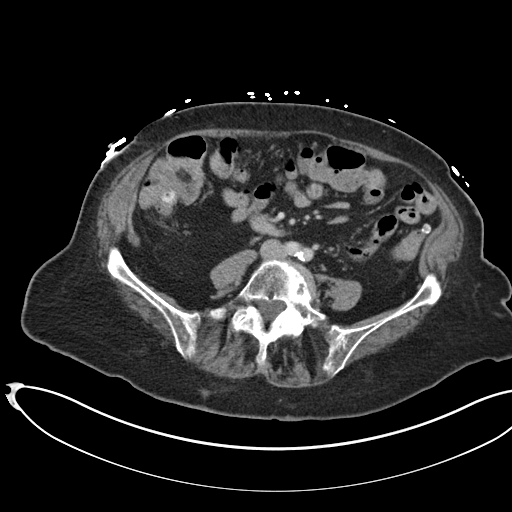
[im 48/80  soft-tissue]
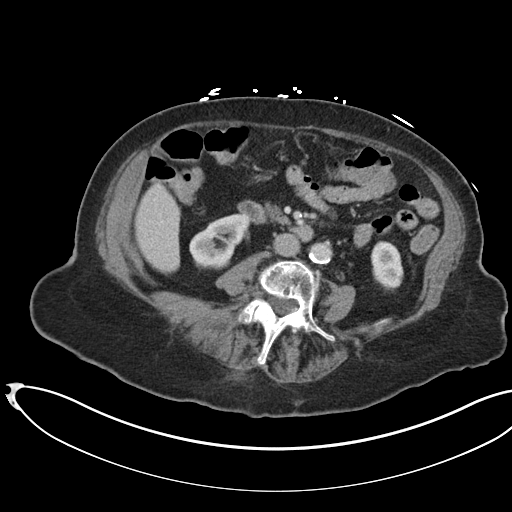
[im 53/80  soft-tissue]
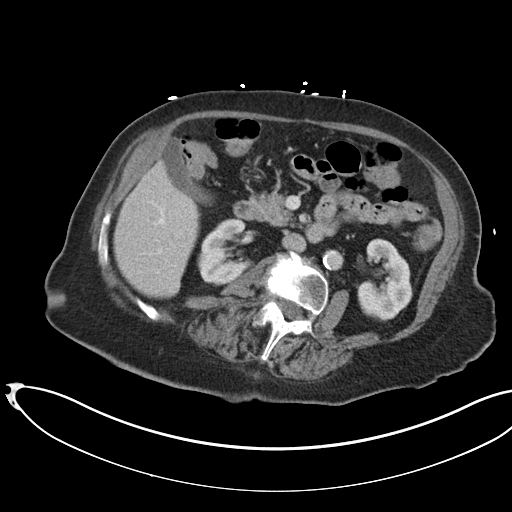
[im 53/80  bone]
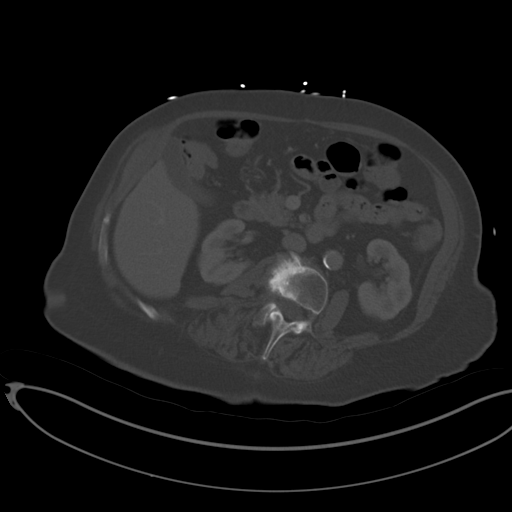
[im 58/80  soft-tissue]
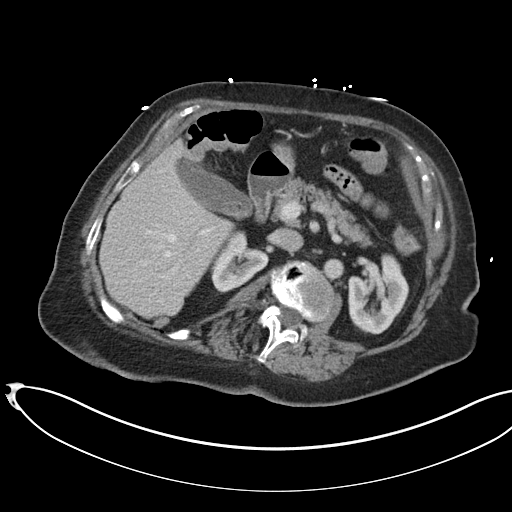
[im 64/80  soft-tissue]
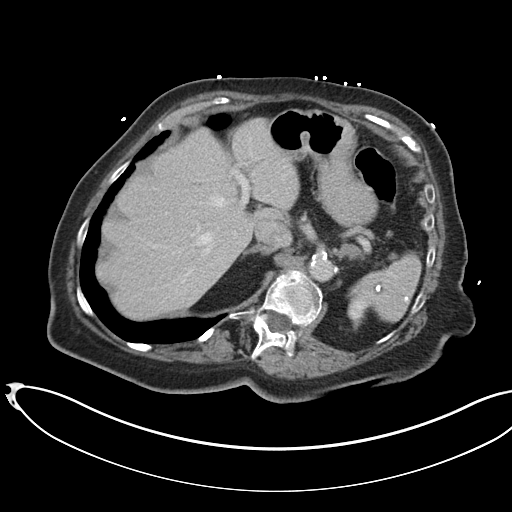
[im 69/80  soft-tissue]
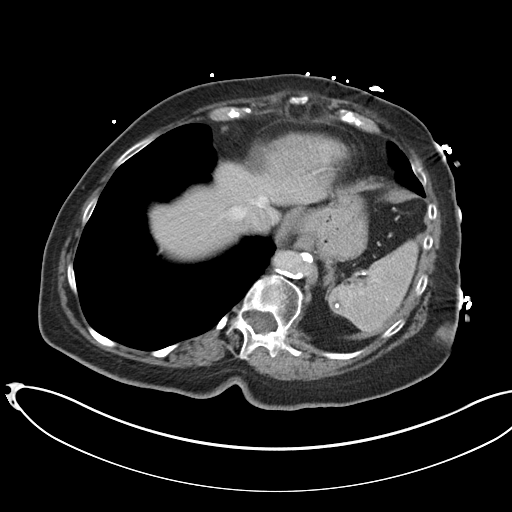
[im 74/80  soft-tissue]
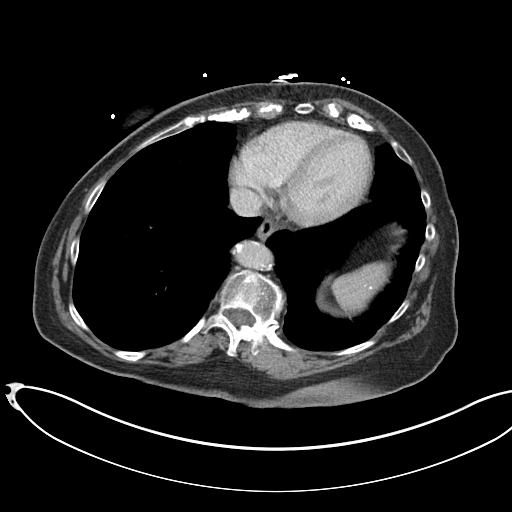

[Series 5: coronal st · coronal · 0.76mm/px · 3 of 151 slices shown]
[im 51/151  soft-tissue]
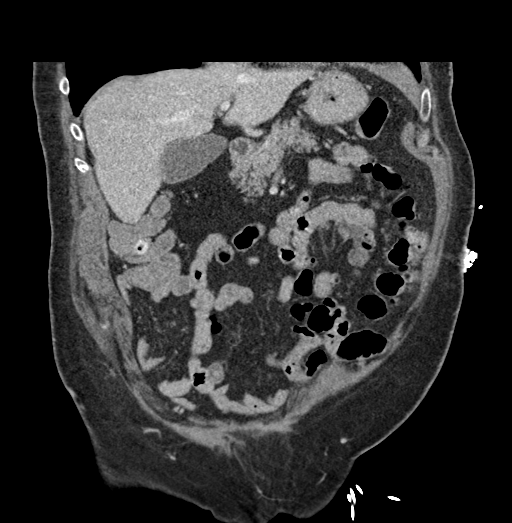
[im 67/151  soft-tissue]
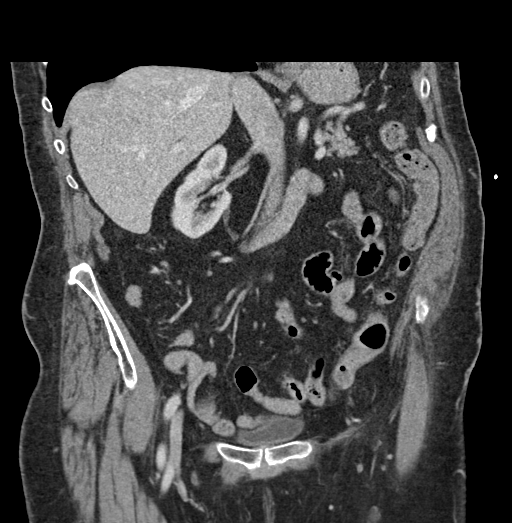
[im 84/151  soft-tissue]
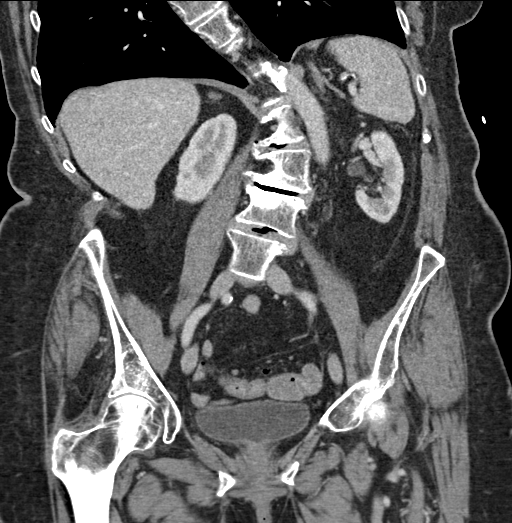

[16 of 46 positions shown; findings below may reference images not displayed]

FINDINGS: Lower chest: Stable mild atelectasis or scarring at the left lung
base. No significant pleural or pericardial effusion. Aortic
atherosclerosis noted.

Hepatobiliary: The liver is normal in density without suspicious
focal abnormality. Multiple small gallstones are again noted. There
is no evidence of gallbladder wall thickening, surrounding
inflammation or biliary ductal dilatation.

Pancreas: Unremarkable. No pancreatic ductal dilatation or
surrounding inflammatory changes.

Spleen: Multiple calcified granulomas. The spleen is normal in size.

Adrenals/Urinary Tract: Stable 1.5 x 1.4 cm left adrenal nodule on
image [DATE], consistent with an adenoma based on stability. The right
adrenal gland appears stable. Both kidneys appear normal, without
evidence of urinary tract calculus or hydronephrosis. There is a
tiny cyst in the lower pole of the right kidney. The bladder appears
unremarkable.

Stomach/Bowel: No enteric contrast administered. The stomach appears
unremarkable for its degree of distension. No evidence of bowel wall
thickening, distention or surrounding inflammatory change. There are
scattered diverticular changes throughout the colon, greatest
distally.

Vascular/Lymphatic: There are no enlarged abdominal or pelvic lymph
nodes. Aortic and branch vessel atherosclerosis. No evidence of
aneurysm or large vessel occlusion. The portal, superior mesenteric
and splenic veins are patent.

Reproductive: Stable prominence of the cervix post hysterectomy. No
adnexal mass.

Other: No evidence of abdominal wall mass or hernia. No ascites.

Musculoskeletal: No acute or significant osseous findings. Severe
convex left thoracolumbar scoliosis with associated spondylosis.
IMPRESSION: 1. No acute findings or explanation for the patient's symptoms.
2. Colonic diverticulosis without evidence of acute inflammation or
bowel obstruction.
3. Cholelithiasis without evidence of cholecystitis or biliary
dilatation.
4.  Aortic Atherosclerosis (OAS7A-NJ4.4).

## 2023-11-14 ENCOUNTER — Other Ambulatory Visit: Payer: Self-pay

## 2023-11-14 ENCOUNTER — Emergency Department (HOSPITAL_COMMUNITY)
Admission: EM | Admit: 2023-11-14 | Discharge: 2023-11-15 | Disposition: A | Discharge status: Home / Self Care | Attending: Emergency Medicine | Admitting: Emergency Medicine

## 2023-11-14 ENCOUNTER — Emergency Department (HOSPITAL_COMMUNITY)

## 2023-11-14 DIAGNOSIS — R519 Headache, unspecified: Secondary | ICD-10-CM | POA: Insufficient documentation

## 2023-11-14 DIAGNOSIS — R531 Weakness: Secondary | ICD-10-CM | POA: Diagnosis not present

## 2023-11-14 DIAGNOSIS — J9811 Atelectasis: Secondary | ICD-10-CM | POA: Diagnosis not present

## 2023-11-14 DIAGNOSIS — I700 Atherosclerosis of aorta: Secondary | ICD-10-CM | POA: Diagnosis not present

## 2023-11-14 DIAGNOSIS — Z79899 Other long term (current) drug therapy: Secondary | ICD-10-CM | POA: Diagnosis not present

## 2023-11-14 DIAGNOSIS — E119 Type 2 diabetes mellitus without complications: Secondary | ICD-10-CM | POA: Diagnosis not present

## 2023-11-14 DIAGNOSIS — R109 Unspecified abdominal pain: Secondary | ICD-10-CM | POA: Insufficient documentation

## 2023-11-14 DIAGNOSIS — I10 Essential (primary) hypertension: Secondary | ICD-10-CM | POA: Diagnosis not present

## 2023-11-14 DIAGNOSIS — Z7984 Long term (current) use of oral hypoglycemic drugs: Secondary | ICD-10-CM | POA: Insufficient documentation

## 2023-11-14 DIAGNOSIS — R030 Elevated blood-pressure reading, without diagnosis of hypertension: Secondary | ICD-10-CM

## 2023-11-14 DIAGNOSIS — R112 Nausea with vomiting, unspecified: Secondary | ICD-10-CM

## 2023-11-14 MED ORDER — ONDANSETRON HCL 4 MG/2ML IJ SOLN
4.0000 mg | Freq: Once | INTRAMUSCULAR | Status: AC
  Administered 2023-11-15: 4 mg via INTRAVENOUS
  Filled 2023-11-14: qty 2

## 2023-11-14 NOTE — ED Provider Notes (Signed)
 [facility] EMERGENCY DEPARTMENT AT [facility] Provider Note   CSN: [id number] Arrival date & time: 11/14/23  2311     History {Add pertinent medical, surgical, social history, OB history to HPI:1} Chief Complaint  Patient presents with   Nausea    Monica Faulkner is a 83 y.o. female.  The history is provided by the patient, the EMS personnel and medical records.  Monica Faulkner is a 83 y.o. female who presents to the Emergency Department complaining of "I think I have food poisoning."  She presents to the ED by EMS for evaluation of nausea, dry heaves, hot flashes and cold chills that started around 630pm.  She thinks she may have gotten food poisoning from eating prosciutto that she purchased on Thursday and left out and ate around 6p this afternoon.  She has associated abdominal discomfort.  No chest pain, HA, sob, diarrhea, dysuria.   Has a hx/o HTN, DM.  Last took meds this am.   Per EMS house had significant hoarding issues and APS report will be filed.  Pt lives alone, walks with a cane or walker.  She reports there are no family or friends available to contact for additional info.  She uses a service to help her get groceries.   No tobacco or alcohol use.  She drinks a glass of wine 3-4 times a week.       Home Medications Prior to Admission medications   Medication Sig Start Date End Date Taking? Authorizing Provider  atenolol (TENORMIN) 25 MG tablet Take 25 mg by mouth every evening.    [provider]  cholecalciferol (VITAMIN D) 1000 units tablet Take 2,000 Units by mouth daily.    [provider]  clonazePAM (KLONOPIN) 0.5 MG tablet Take 1 mg by mouth at bedtime. May take additional 1mg  as needed for sleep 04/23/18   [provider]  cloNIDine (CATAPRES) 0.2 MG tablet Take 0.2 mg by mouth at bedtime.  04/23/18   [provider]  diphenhydrAMINE (BENADRYL) 25 mg capsule Take 25 mg by mouth at bedtime as needed for  allergies.     [provider]  escitalopram (LEXAPRO) 10 MG tablet Take 10 mg by mouth every morning.    [provider]  famotidine (PEPCID) 40 MG tablet Take 1 tablet (40 mg total) by mouth daily for 15 days. 04/30/21 05/15/21  Carmel Sacramento, MD  ibuprofen (ADVIL,MOTRIN) 200 MG tablet Take 200 mg by mouth daily as needed for fever or mild pain.     [provider]  JANUVIA 100 MG tablet Take 100 mg by mouth daily. 04/01/18   [provider]  Loperamide HCl (IMODIUM A-D PO) Take 2 tablets by mouth every hour as needed (diarrhea).    [provider]  metoCLOPramide (REGLAN) 10 MG tablet Take 1 tablet (10 mg total) by mouth 3 (three) times daily before meals for 10 days. 04/30/21 05/10/21  Carmel Sacramento, MD  Naphazoline HCl (CLEAR EYES OP) Place 1 drop into both eyes daily as needed (dry eyes).    [provider]  ondansetron (ZOFRAN) 4 MG tablet Take 1 tablet (4 mg total) by mouth every 6 (six) hours. Patient taking differently: Take 4 mg by mouth every 6 (six) hours as needed for nausea or vomiting. 04/19/21   Theron Arista, PA-C  pravastatin (PRAVACHOL) 20 MG tablet Take 20 mg by mouth every other day.  02/24/18   [provider]  telmisartan (MICARDIS) 80 MG tablet Take  80 mg by mouth daily. 04/17/21   [provider]  zolpidem (AMBIEN) 5 MG tablet Take 5 mg by mouth at bedtime as needed for sleep. 06/14/18   [provider]      Allergies    Avapro [irbesartan], Calcium channel blockers, Decongestant [pseudoephedrine], Norvasc [amlodipine besylate], Sulfa antibiotics, and Zofran [ondansetron]    Review of Systems   Review of Systems  All other systems reviewed and are negative.   Physical Exam Updated Vital Signs BP (!) 196/109 (BP Location: Right Arm)   Pulse 70   Temp 97.6 F (36.4 C) (Oral)   Resp 20   Ht 5\' 5"  (1.651 m)   Wt 63.5 kg   SpO2 97%   BMI 23.30 kg/m  Physical Exam Vitals and nursing note  reviewed.  Constitutional:      Appearance: She is well-developed.     Comments: disheveled  HENT:     Head: Normocephalic and atraumatic.  Cardiovascular:     Rate and Rhythm: Normal rate and regular rhythm.     Heart sounds: No murmur heard. Pulmonary:     Effort: Pulmonary effort is normal. No respiratory distress.     Breath sounds: Normal breath sounds.  Abdominal:     Palpations: Abdomen is soft.     Tenderness: There is no abdominal tenderness. There is no guarding or rebound.  Musculoskeletal:        General: No tenderness.     Comments: 2+ DP pulses bilaterally.  Trace edema to BLE  Skin:    General: Skin is warm and dry.  Neurological:     Mental Status: She is alert and oriented to person, place, and time.     Comments: 5/5 strength in all four extremities with sensation to light touch intact in all four extremities.   Psychiatric:        Behavior: Behavior normal.     ED Results / Procedures / Treatments   Labs (all labs ordered are listed, but only abnormal results are displayed) Labs Reviewed - No data to display  EKG None  Radiology No results found.  Procedures Procedures  {Document cardiac monitor, telemetry assessment procedure when appropriate:1}  Medications Ordered in ED Medications  ondansetron (ZOFRAN) injection 4 mg (has no administration in time range)    ED Course/ Medical Decision Making/ A&P   {   Click here for ABCD2, HEART and other calculatorsREFRESH Note before signing :1}                              Medical Decision Making Amount and/or Complexity of Data Reviewed Labs: ordered. Radiology: ordered.  Risk Prescription drug management.   ***  {Document critical care time when appropriate:1} {Document review of labs and clinical decision tools ie heart score, Chads2Vasc2 etc:1}  {Document your independent review of radiology images, and any outside records:1} {Document your discussion with family members, caretakers,  and with consultants:1} {Document social determinants of health affecting pt's care:1} {Document your decision making why or why not admission, treatments were needed:1} Final Clinical Impression(s) / ED Diagnoses Final diagnoses:  None    Rx / DC Orders ED Discharge Orders     None

## 2023-11-14 NOTE — ED Triage Notes (Signed)
 Pt BIB [facility] from home. Pt states she had some meat around 1800 tonight and started to feel nauseous and vomited once. Pt did not take night dose of HTN meds due to being nauseous.   Per EMS pts home was unsafe living environment due to extreme hoarding,  with cat feces all over and other critters present In the home.

## 2023-11-15 ENCOUNTER — Emergency Department (HOSPITAL_COMMUNITY)

## 2023-11-15 DIAGNOSIS — I10 Essential (primary) hypertension: Secondary | ICD-10-CM | POA: Diagnosis not present

## 2023-11-15 DIAGNOSIS — R531 Weakness: Secondary | ICD-10-CM | POA: Diagnosis not present

## 2023-11-15 DIAGNOSIS — R110 Nausea: Secondary | ICD-10-CM | POA: Diagnosis not present

## 2023-11-15 DIAGNOSIS — Z7401 Bed confinement status: Secondary | ICD-10-CM | POA: Diagnosis not present

## 2023-11-15 DIAGNOSIS — R519 Headache, unspecified: Secondary | ICD-10-CM | POA: Diagnosis not present

## 2023-11-15 LAB — COMPREHENSIVE METABOLIC PANEL WITH GFR
ALT: 13 U/L (ref 0–44)
AST: 25 U/L (ref 15–41)
Albumin: 3.8 g/dL (ref 3.5–5.0)
Alkaline Phosphatase: 61 U/L (ref 38–126)
Anion gap: 13 (ref 5–15)
BUN: 18 mg/dL (ref 8–23)
CO2: 25 mmol/L (ref 22–32)
Calcium: 9.7 mg/dL (ref 8.9–10.3)
Chloride: 99 mmol/L (ref 98–111)
Creatinine, Ser: 0.93 mg/dL (ref 0.44–1.00)
GFR, Estimated: >60 mL/min (ref >60)
Glucose, Bld: 202 mg/dL — ABNORMAL HIGH (ref 70–99)
Potassium: 3.5 mmol/L (ref 3.5–5.1)
Sodium: 137 mmol/L (ref 135–145)
Total Bilirubin: 1 mg/dL (ref 0.0–1.2)
Total Protein: 7.2 g/dL (ref 6.5–8.1)

## 2023-11-15 LAB — CBC WITH DIFFERENTIAL/PLATELET
Abs Immature Granulocytes: 0.02 10*3/uL (ref 0.00–0.07)
Basophils Absolute: 0 10*3/uL (ref 0.0–0.1)
Basophils Relative: 0 %
Eosinophils Absolute: 0 10*3/uL (ref 0.0–0.5)
Eosinophils Relative: 1 %
HCT: 44.5 % (ref 36.0–46.0)
Hemoglobin: 14.9 g/dL (ref 12.0–15.0)
Immature Granulocytes: 0 %
Lymphocytes Relative: 8 %
Lymphs Abs: 0.7 10*3/uL (ref 0.7–4.0)
MCH: 32.7 pg (ref 26.0–34.0)
MCHC: 33.5 g/dL (ref 30.0–36.0)
MCV: 97.8 fL (ref 80.0–100.0)
Monocytes Absolute: 0.3 10*3/uL (ref 0.1–1.0)
Monocytes Relative: 4 %
Neutro Abs: 7.1 10*3/uL (ref 1.7–7.7)
Neutrophils Relative %: 87 %
Platelets: 275 10*3/uL (ref 150–400)
RBC: 4.55 MIL/uL (ref 3.87–5.11)
RDW: 11.9 % (ref 11.5–15.5)
WBC: 8.2 10*3/uL (ref 4.0–10.5)
nRBC: 0 % (ref 0.0–0.2)

## 2023-11-15 LAB — URINALYSIS, ROUTINE W REFLEX MICROSCOPIC
Bilirubin Urine: NEGATIVE
Glucose, UA: >=500 mg/dL — AB
Hgb urine dipstick: NEGATIVE
Ketones, ur: 5 mg/dL — AB
Leukocytes,Ua: NEGATIVE
Nitrite: NEGATIVE
Protein, ur: 100 mg/dL — AB
Specific Gravity, Urine: 1.006 (ref 1.005–1.030)
pH: 9 — ABNORMAL HIGH (ref 5.0–8.0)

## 2023-11-15 LAB — ETHANOL: Alcohol, Ethyl (B): <10 mg/dL (ref <10)

## 2023-11-15 LAB — TROPONIN I (HIGH SENSITIVITY)
Troponin I (High Sensitivity): 11 ng/L (ref <18)
Troponin I (High Sensitivity): 12 ng/L (ref <18)

## 2023-11-15 LAB — LIPASE, BLOOD: Lipase: 33 U/L (ref 11–51)

## 2023-11-15 MED ORDER — METOCLOPRAMIDE HCL 5 MG/ML IJ SOLN
10.0000 mg | Freq: Once | INTRAMUSCULAR | Status: AC
  Administered 2023-11-15: 10 mg via INTRAVENOUS
  Filled 2023-11-15: qty 2

## 2023-11-15 MED ORDER — ATENOLOL 25 MG PO TABS
25.0000 mg | ORAL_TABLET | Freq: Once | ORAL | Status: AC
  Administered 2023-11-15: 25 mg via ORAL
  Filled 2023-11-15: qty 1

## 2023-11-15 MED ORDER — ONDANSETRON 4 MG PO TBDP: 4.0000 mg | ORAL_TABLET | Freq: Three times a day (TID) | ORAL | 0 refills | Status: DC | PRN

## 2023-11-15 MED ORDER — HYDRALAZINE HCL 20 MG/ML IJ SOLN
10.0000 mg | Freq: Once | INTRAMUSCULAR | Status: AC
  Administered 2023-11-15: 10 mg via INTRAVENOUS
  Filled 2023-11-15: qty 1

## 2023-11-15 NOTE — ED Notes (Signed)
 EDP made aware of pt's BP at d/c & did inform this RN she was safe to be d/c via [facility] to go home & take the rest of her home meds, was given Atenolol while here.

## 2023-11-15 NOTE — TOC Progression Note (Signed)
 Transition of Care [facility]) - Progression Note    Patient Details  Name: Monica Faulkner MRN: [id number] Date of Birth: 06-09-1941  [facility] CM/SW Contact  Carmina Miller, LCSWA Phone Number: 11/15/2023, 9:38 AM  Clinical Narrative:     CSW spoke with Koleen Nimrod with APS Afterhours, no report was made by EMS, report made by CSW, unclear whether report will be accepted. Koleen Nimrod states CSW will be notified if report is accepted.         Expected Discharge Plan and Services                                               Social Determinants of Health (SDOH) Interventions SDOH Screenings   Tobacco Use: Low Risk  (04/30/2021)    Readmission Risk Interventions     No data to display

## 2023-11-15 NOTE — TOC Initial Note (Signed)
 Transition of Care [facility]) - Initial/Assessment Note    Patient Details  Name: Monica Faulkner MRN: [id number] Date of Birth: [birth year]  Transition of Care [facility]) CM/SW Contact:    Carmina Miller, LCSWA Phone Number: 11/15/2023, 9:09 AM  Clinical Narrative:                  Per chart review, EMS was supposed to make an APS report due to pt's living conditions, unclear if report has been made, CSW waiting on return call from [facility] APS Afterhours.        Patient Goals and CMS Choice            Expected Discharge Plan and Services                                              Prior Living Arrangements/Services                       Activities of Daily Living      Permission Sought/Granted                  Emotional Assessment              Admission diagnosis:  Hypertension Patient Active Problem List   Diagnosis Date Noted   Hyponatremia 07/02/2018   Macular hole 02/18/2012   PCP:  Georgianne Fick, MD Pharmacy:   [facility] [phone] Ginette Otto, [place] - [address] AT [facility] [address] [place] [address] Phone: [phone] Fax: [phone]     Social Drivers of Health (SDOH) Social History: SDOH Screenings   Tobacco Use: Low Risk  (04/30/2021)   SDOH Interventions:     Readmission Risk Interventions     No data to display

## 2023-11-15 NOTE — ED Notes (Signed)
 PTAR called; 3rd in line.

## 2023-11-20 LAB — CULTURE, BLOOD (ROUTINE X 2)
Culture: NO GROWTH
Culture: NO GROWTH
Special Requests: ADEQUATE
Special Requests: ADEQUATE

## 2023-12-24 DIAGNOSIS — F419 Anxiety disorder, unspecified: Secondary | ICD-10-CM | POA: Diagnosis not present

## 2023-12-30 DIAGNOSIS — I129 Hypertensive chronic kidney disease with stage 1 through stage 4 chronic kidney disease, or unspecified chronic kidney disease: Secondary | ICD-10-CM | POA: Diagnosis not present

## 2023-12-30 DIAGNOSIS — N182 Chronic kidney disease, stage 2 (mild): Secondary | ICD-10-CM | POA: Diagnosis not present

## 2023-12-30 DIAGNOSIS — F331 Major depressive disorder, recurrent, moderate: Secondary | ICD-10-CM | POA: Diagnosis not present

## 2023-12-30 DIAGNOSIS — E1122 Type 2 diabetes mellitus with diabetic chronic kidney disease: Secondary | ICD-10-CM | POA: Diagnosis not present

## 2023-12-30 DIAGNOSIS — R5383 Other fatigue: Secondary | ICD-10-CM | POA: Diagnosis not present

## 2023-12-30 DIAGNOSIS — M810 Age-related osteoporosis without current pathological fracture: Secondary | ICD-10-CM | POA: Diagnosis not present

## 2023-12-30 DIAGNOSIS — E785 Hyperlipidemia, unspecified: Secondary | ICD-10-CM | POA: Diagnosis not present

## 2024-01-06 DIAGNOSIS — Z23 Encounter for immunization: Secondary | ICD-10-CM | POA: Diagnosis not present

## 2024-01-06 DIAGNOSIS — N182 Chronic kidney disease, stage 2 (mild): Secondary | ICD-10-CM | POA: Diagnosis not present

## 2024-01-06 DIAGNOSIS — M810 Age-related osteoporosis without current pathological fracture: Secondary | ICD-10-CM | POA: Diagnosis not present

## 2024-01-06 DIAGNOSIS — Z0000 Encounter for general adult medical examination without abnormal findings: Secondary | ICD-10-CM | POA: Diagnosis not present

## 2024-01-06 DIAGNOSIS — R7303 Prediabetes: Secondary | ICD-10-CM | POA: Diagnosis not present

## 2024-01-06 DIAGNOSIS — I129 Hypertensive chronic kidney disease with stage 1 through stage 4 chronic kidney disease, or unspecified chronic kidney disease: Secondary | ICD-10-CM | POA: Diagnosis not present

## 2024-01-06 DIAGNOSIS — F331 Major depressive disorder, recurrent, moderate: Secondary | ICD-10-CM | POA: Diagnosis not present

## 2024-01-06 DIAGNOSIS — E1122 Type 2 diabetes mellitus with diabetic chronic kidney disease: Secondary | ICD-10-CM | POA: Diagnosis not present

## 2024-01-06 DIAGNOSIS — E785 Hyperlipidemia, unspecified: Secondary | ICD-10-CM | POA: Diagnosis not present

## 2024-04-06 DIAGNOSIS — F331 Major depressive disorder, recurrent, moderate: Secondary | ICD-10-CM | POA: Diagnosis not present

## 2024-04-06 DIAGNOSIS — F419 Anxiety disorder, unspecified: Secondary | ICD-10-CM | POA: Diagnosis not present

## 2024-04-06 DIAGNOSIS — N182 Chronic kidney disease, stage 2 (mild): Secondary | ICD-10-CM | POA: Diagnosis not present

## 2024-04-06 DIAGNOSIS — E1122 Type 2 diabetes mellitus with diabetic chronic kidney disease: Secondary | ICD-10-CM | POA: Diagnosis not present

## 2024-04-06 DIAGNOSIS — I129 Hypertensive chronic kidney disease with stage 1 through stage 4 chronic kidney disease, or unspecified chronic kidney disease: Secondary | ICD-10-CM | POA: Diagnosis not present

## 2024-04-06 DIAGNOSIS — E785 Hyperlipidemia, unspecified: Secondary | ICD-10-CM | POA: Diagnosis not present

## 2024-04-06 DIAGNOSIS — M810 Age-related osteoporosis without current pathological fracture: Secondary | ICD-10-CM | POA: Diagnosis not present

## 2024-07-22 ENCOUNTER — Emergency Department (HOSPITAL_COMMUNITY)

## 2024-07-22 ENCOUNTER — Encounter (HOSPITAL_COMMUNITY): Payer: Self-pay

## 2024-07-22 ENCOUNTER — Inpatient Hospital Stay (HOSPITAL_COMMUNITY)
Admission: EM | Admit: 2024-07-22 | Discharge: 2024-07-28 | DRG: 871 | Disposition: A | Discharge status: Skilled Nursing Facility | Attending: Internal Medicine | Admitting: Internal Medicine

## 2024-07-22 ENCOUNTER — Inpatient Hospital Stay (HOSPITAL_COMMUNITY)

## 2024-07-22 DIAGNOSIS — D72829 Elevated white blood cell count, unspecified: Secondary | ICD-10-CM | POA: Diagnosis present

## 2024-07-22 DIAGNOSIS — E860 Dehydration: Principal | ICD-10-CM | POA: Diagnosis present

## 2024-07-22 DIAGNOSIS — R531 Weakness: Secondary | ICD-10-CM | POA: Diagnosis not present

## 2024-07-22 DIAGNOSIS — I4891 Unspecified atrial fibrillation: Secondary | ICD-10-CM | POA: Diagnosis not present

## 2024-07-22 DIAGNOSIS — K219 Gastro-esophageal reflux disease without esophagitis: Secondary | ICD-10-CM | POA: Diagnosis present

## 2024-07-22 DIAGNOSIS — K8020 Calculus of gallbladder without cholecystitis without obstruction: Secondary | ICD-10-CM | POA: Diagnosis not present

## 2024-07-22 DIAGNOSIS — I10 Essential (primary) hypertension: Secondary | ICD-10-CM | POA: Diagnosis present

## 2024-07-22 DIAGNOSIS — I700 Atherosclerosis of aorta: Secondary | ICD-10-CM | POA: Diagnosis not present

## 2024-07-22 DIAGNOSIS — I959 Hypotension, unspecified: Secondary | ICD-10-CM | POA: Diagnosis not present

## 2024-07-22 DIAGNOSIS — R0689 Other abnormalities of breathing: Secondary | ICD-10-CM | POA: Diagnosis not present

## 2024-07-22 DIAGNOSIS — E872 Acidosis, unspecified: Secondary | ICD-10-CM | POA: Diagnosis present

## 2024-07-22 DIAGNOSIS — R739 Hyperglycemia, unspecified: Secondary | ICD-10-CM | POA: Diagnosis not present

## 2024-07-22 DIAGNOSIS — E119 Type 2 diabetes mellitus without complications: Secondary | ICD-10-CM

## 2024-07-22 DIAGNOSIS — G9341 Metabolic encephalopathy: Secondary | ICD-10-CM | POA: Diagnosis not present

## 2024-07-22 DIAGNOSIS — N179 Acute kidney failure, unspecified: Secondary | ICD-10-CM | POA: Diagnosis present

## 2024-07-22 LAB — COMPREHENSIVE METABOLIC PANEL WITH GFR
ALT: 15 U/L (ref 0–44)
AST: 43 U/L — ABNORMAL HIGH (ref 15–41)
Albumin: 3.3 g/dL — ABNORMAL LOW (ref 3.5–5.0)
Alkaline Phosphatase: 65 U/L (ref 38–126)
Anion gap: 21 — ABNORMAL HIGH (ref 5–15)
BUN: 62 mg/dL — ABNORMAL HIGH (ref 8–23)
CO2: 19 mmol/L — ABNORMAL LOW (ref 22–32)
Calcium: 9.8 mg/dL (ref 8.9–10.3)
Chloride: 100 mmol/L (ref 98–111)
Creatinine, Ser: 2.92 mg/dL — ABNORMAL HIGH (ref 0.44–1.00)
GFR, Estimated: 16 mL/min — ABNORMAL LOW (ref >60)
Glucose, Bld: 284 mg/dL — ABNORMAL HIGH (ref 70–99)
Potassium: 4.1 mmol/L (ref 3.5–5.1)
Sodium: 140 mmol/L (ref 135–145)
Total Bilirubin: 1.3 mg/dL — ABNORMAL HIGH (ref 0.0–1.2)
Total Protein: 6.5 g/dL (ref 6.5–8.1)

## 2024-07-22 LAB — URINALYSIS, W/ REFLEX TO CULTURE (INFECTION SUSPECTED)
Glucose, UA: >=500 mg/dL — AB
Ketones, ur: 15 mg/dL — AB
Leukocytes,Ua: NEGATIVE
Nitrite: NEGATIVE
Protein, ur: >300 mg/dL — AB
Specific Gravity, Urine: 1.025 (ref 1.005–1.030)
pH: 6.5 (ref 5.0–8.0)

## 2024-07-22 LAB — I-STAT VENOUS BLOOD GAS, ED
Acid-Base Excess: 0 mmol/L (ref 0.0–2.0)
Bicarbonate: 24.5 mmol/L (ref 20.0–28.0)
Calcium, Ion: 1.03 mmol/L — ABNORMAL LOW (ref 1.15–1.40)
HCT: 51 % — ABNORMAL HIGH (ref 36.0–46.0)
Hemoglobin: 17.3 g/dL — ABNORMAL HIGH (ref 12.0–15.0)
O2 Saturation: 84 %
Potassium: 3.8 mmol/L (ref 3.5–5.1)
Sodium: 136 mmol/L (ref 135–145)
TCO2: 26 mmol/L (ref 22–32)
pCO2, Ven: 39.7 mmHg — ABNORMAL LOW (ref 44–60)
pH, Ven: 7.398 (ref 7.25–7.43)
pO2, Ven: 49 mmHg — ABNORMAL HIGH (ref 32–45)

## 2024-07-22 LAB — CBC WITH DIFFERENTIAL/PLATELET
Abs Immature Granulocytes: 0.15 K/uL — ABNORMAL HIGH (ref 0.00–0.07)
Basophils Absolute: 0.1 K/uL (ref 0.0–0.1)
Basophils Relative: 0 %
Eosinophils Absolute: 0 K/uL (ref 0.0–0.5)
Eosinophils Relative: 0 %
HCT: 51.5 % — ABNORMAL HIGH (ref 36.0–46.0)
Hemoglobin: 17.7 g/dL — ABNORMAL HIGH (ref 12.0–15.0)
Immature Granulocytes: 1 %
Lymphocytes Relative: 3 %
Lymphs Abs: 0.8 K/uL (ref 0.7–4.0)
MCH: 32.2 pg (ref 26.0–34.0)
MCHC: 34.4 g/dL (ref 30.0–36.0)
MCV: 93.8 fL (ref 80.0–100.0)
Monocytes Absolute: 2.4 K/uL — ABNORMAL HIGH (ref 0.1–1.0)
Monocytes Relative: 10 %
Neutro Abs: 21.2 K/uL — ABNORMAL HIGH (ref 1.7–7.7)
Neutrophils Relative %: 86 %
Platelets: 344 K/uL (ref 150–400)
RBC: 5.49 MIL/uL — ABNORMAL HIGH (ref 3.87–5.11)
RDW: 12.1 % (ref 11.5–15.5)
WBC: 24.6 K/uL — ABNORMAL HIGH (ref 4.0–10.5)
nRBC: 0 % (ref 0.0–0.2)

## 2024-07-22 LAB — RAPID URINE DRUG SCREEN, HOSP PERFORMED
Amphetamines: NOT DETECTED
Barbiturates: NOT DETECTED
Benzodiazepines: NOT DETECTED
Cocaine: NOT DETECTED
Opiates: NOT DETECTED
Tetrahydrocannabinol: NOT DETECTED

## 2024-07-22 LAB — CBC
HCT: 50.7 % — ABNORMAL HIGH (ref 36.0–46.0)
Hemoglobin: 17.3 g/dL — ABNORMAL HIGH (ref 12.0–15.0)
MCH: 32 pg (ref 26.0–34.0)
MCHC: 34.1 g/dL (ref 30.0–36.0)
MCV: 93.9 fL (ref 80.0–100.0)
Platelets: 303 K/uL (ref 150–400)
RBC: 5.4 MIL/uL — ABNORMAL HIGH (ref 3.87–5.11)
RDW: 12.1 % (ref 11.5–15.5)
WBC: 28.3 K/uL — ABNORMAL HIGH (ref 4.0–10.5)
nRBC: 0 % (ref 0.0–0.2)

## 2024-07-22 LAB — I-STAT CG4 LACTIC ACID, ED
Lactic Acid, Venous: 3.9 mmol/L (ref 0.5–1.9)
Lactic Acid, Venous: 1.7 mmol/L (ref 0.5–1.9)

## 2024-07-22 LAB — HEMOGLOBIN A1C
Hgb A1c MFr Bld: 6.7 % — ABNORMAL HIGH (ref 4.8–5.6)
Mean Plasma Glucose: 145.59 mg/dL

## 2024-07-22 LAB — CK: Total CK: 205 U/L (ref 38–234)

## 2024-07-22 LAB — CREATININE, SERUM
Creatinine, Ser: 2.35 mg/dL — ABNORMAL HIGH (ref 0.44–1.00)
GFR, Estimated: 20 mL/min — ABNORMAL LOW (ref >60)

## 2024-07-22 LAB — TSH: TSH: 2.284 u[IU]/mL (ref 0.350–4.500)

## 2024-07-22 LAB — MAGNESIUM: Magnesium: 1.8 mg/dL (ref 1.7–2.4)

## 2024-07-22 LAB — GLUCOSE, CAPILLARY: Glucose-Capillary: 266 mg/dL — ABNORMAL HIGH (ref 70–99)

## 2024-07-22 LAB — LACTIC ACID, PLASMA: Lactic Acid, Venous: 2.7 mmol/L (ref 0.5–1.9)

## 2024-07-22 LAB — ETHANOL: Alcohol, Ethyl (B): <15 mg/dL (ref <15)

## 2024-07-22 MED ORDER — LACTATED RINGERS IV BOLUS
1000.0000 mL | Freq: Once | INTRAVENOUS | Status: AC
  Administered 2024-07-22: 1000 mL via INTRAVENOUS

## 2024-07-22 MED ORDER — METRONIDAZOLE 500 MG/100ML IV SOLN
500.0000 mg | Freq: Once | INTRAVENOUS | Status: AC
  Administered 2024-07-22: 500 mg via INTRAVENOUS
  Filled 2024-07-22: qty 100

## 2024-07-22 MED ORDER — INSULIN ASPART 100 UNIT/ML IJ SOLN
0.0000 [IU] | Freq: Every day | INTRAMUSCULAR | Status: DC
  Administered 2024-07-22: 3 [IU] via SUBCUTANEOUS
  Filled 2024-07-22: qty 3

## 2024-07-22 MED ORDER — HEPARIN SODIUM (PORCINE) 5000 UNIT/ML IJ SOLN
5000.0000 [IU] | Freq: Three times a day (TID) | INTRAMUSCULAR | Status: DC
  Administered 2024-07-22 – 2024-07-28 (×17): 5000 [IU] via SUBCUTANEOUS
  Filled 2024-07-22 (×17): qty 1

## 2024-07-22 MED ORDER — LACTATED RINGERS IV BOLUS (SEPSIS)
1000.0000 mL | Freq: Once | INTRAVENOUS | Status: AC
  Administered 2024-07-22: 1000 mL via INTRAVENOUS

## 2024-07-22 MED ORDER — INSULIN ASPART 100 UNIT/ML IJ SOLN
0.0000 [IU] | Freq: Three times a day (TID) | INTRAMUSCULAR | Status: DC
  Administered 2024-07-23: 1 [IU] via SUBCUTANEOUS
  Administered 2024-07-23 – 2024-07-24 (×2): 2 [IU] via SUBCUTANEOUS
  Filled 2024-07-22: qty 2
  Filled 2024-07-22: qty 1

## 2024-07-22 MED ORDER — VANCOMYCIN HCL IN DEXTROSE 1-5 GM/200ML-% IV SOLN
1000.0000 mg | Freq: Once | INTRAVENOUS | Status: AC
  Administered 2024-07-22: 1000 mg via INTRAVENOUS
  Filled 2024-07-22: qty 200

## 2024-07-22 MED ORDER — LACTATED RINGERS IV SOLN
INTRAVENOUS | Status: AC
  Administered 2024-07-23: 125 mL/h via INTRAVENOUS

## 2024-07-22 MED ORDER — SODIUM CHLORIDE 0.9 % IV SOLN
2.0000 g | Freq: Once | INTRAVENOUS | Status: AC
  Administered 2024-07-22: 2 g via INTRAVENOUS
  Filled 2024-07-22: qty 12.5

## 2024-07-22 MED ORDER — LACTATED RINGERS IV SOLN: INTRAVENOUS | Status: DC

## 2024-07-22 MED ORDER — ENOXAPARIN SODIUM 40 MG/0.4ML IJ SOSY: 40.0000 mg | PREFILLED_SYRINGE | INTRAMUSCULAR | Status: DC

## 2024-07-22 NOTE — ED Provider Notes (Signed)
 [place] EMERGENCY DEPARTMENT AT [facility] Provider Note   CSN: [id number] Arrival date & time: 07/22/24  1515     Patient presents with: Weakness   Monica Faulkner is a 83 y.o. female with history per chart review of hypertension, diabetes, and anxiety presents emergency department today for evaluation after wellness check.  Patient was found at her house.  Fire department had to crawl over around 4 to 5 feet of trash in order to get to the patient he was found in very poor living conditions with trash, rodents, and old food.  She reports that she had not gotten out of bed in several days.  Recently complains of fatigue.  She denies any belly pain, back pain, chest pain, shortness of breath, headaches, fevers, dysuria, hematuria.  Weakness      Prior to Admission medications   Medication Sig Start Date End Date Taking? Authorizing Provider  atenolol  (TENORMIN ) 25 MG tablet Take 25 mg by mouth every evening.    [provider]  cholecalciferol (VITAMIN D) 1000 units tablet Take 2,000 Units by mouth daily.    [provider]  clonazePAM  (KLONOPIN ) 0.5 MG tablet Take 1 mg by mouth at bedtime. May take additional 1mg  as needed for sleep 04/23/18   [provider]  cloNIDine  (CATAPRES ) 0.2 MG tablet Take 0.2 mg by mouth at bedtime.  04/23/18   [provider]  diphenhydrAMINE  (BENADRYL ) 25 mg capsule Take 25 mg by mouth at bedtime as needed for allergies.     [provider]  escitalopram  (LEXAPRO ) 10 MG tablet Take 10 mg by mouth every morning.    [provider]  famotidine  (PEPCID ) 40 MG tablet Take 1 tablet (40 mg total) by mouth daily for 15 days. 04/30/21 05/15/21  Tobie Litter, MD  ibuprofen  (ADVIL ,MOTRIN ) 200 MG tablet Take 200 mg by mouth daily as needed for fever or mild pain.     [provider]  JANUVIA 100 MG tablet Take 100 mg by mouth daily. 04/01/18   [provider]  Loperamide  HCl (IMODIUM  A-D PO)  Take 2 tablets by mouth every hour as needed (diarrhea).    [provider]  metoCLOPramide  (REGLAN ) 10 MG tablet Take 1 tablet (10 mg total) by mouth 3 (three) times daily before meals for 10 days. 04/30/21 05/10/21  Tobie Litter, MD  Naphazoline HCl (CLEAR EYES OP) Place 1 drop into both eyes daily as needed (dry eyes).    [provider]  ondansetron  (ZOFRAN ) 4 MG tablet Take 1 tablet (4 mg total) by mouth every 6 (six) hours. Patient taking differently: Take 4 mg by mouth every 6 (six) hours as needed for nausea or vomiting. 04/19/21   Emelia Sluder, PA-C  ondansetron  (ZOFRAN -ODT) 4 MG disintegrating tablet Take 1 tablet (4 mg total) by mouth every 8 (eight) hours as needed. 11/15/23   Griselda Norris, MD  pravastatin  (PRAVACHOL ) 20 MG tablet Take 20 mg by mouth every other day.  02/24/18   [provider]  telmisartan (MICARDIS) 80 MG tablet Take 80 mg by mouth daily. 04/17/21   [provider]  zolpidem (AMBIEN) 5 MG tablet Take 5 mg by mouth at bedtime as needed for sleep. 06/14/18   [provider]    Allergies: Avapro [irbesartan], Calcium channel blockers, Decongestant [pseudoephedrine], Norvasc [amlodipine besylate], Sulfa antibiotics, and Zofran  [ondansetron ]    Review of Systems  Constitutional:  Positive for fatigue.  See HPI  Updated Vital Signs BP (!) 146/75  Pulse 90   Temp 99.4 F (37.4 C) (Rectal)   Resp 20   SpO2 97%   Physical Exam Vitals and nursing note reviewed.  Constitutional:      Appearance: She is not toxic-appearing.     Comments: Unkempt, covered in crawling bugs and old food and dirt.  HENT:     Mouth/Throat:     Mouth: Mucous membranes are dry.     Comments: Significantly dry mucous membranes. Eyes:     General: No scleral icterus. Cardiovascular:     Rate and Rhythm: Normal rate.  Pulmonary:     Effort: Pulmonary effort is normal. No respiratory distress.  Abdominal:     Palpations: Abdomen is soft.      Tenderness: There is no abdominal tenderness.  Skin:    General: Skin is warm and dry.  Neurological:     Mental Status: She is oriented to person, place, and time.     Cranial Nerves: No cranial nerve deficit.     Motor: No weakness.     Comments: Patient oriented to person, place, time, and situation.  Slight tremor noted.  Cranial nerves are grossly intact.  Answer questions appropriately however patient is slightly hard of hearing.  No facial droop noted.  Strength intact in upper and lower bilateral extremities.     (all labs ordered are listed, but only abnormal results are displayed) Labs Reviewed  URINALYSIS, W/ REFLEX TO CULTURE (INFECTION SUSPECTED)  LACTIC ACID, PLASMA  LACTIC ACID, PLASMA  CBC WITH DIFFERENTIAL/PLATELET  COMPREHENSIVE METABOLIC PANEL WITH GFR  TSH  MAGNESIUM   CK  I-STAT VENOUS BLOOD GAS, ED    EKG: EKG Interpretation Date/Time:  Friday July 22 2024 15:45:57 EST Ventricular Rate:  95 PR Interval:  112 QRS Duration:  80 QT Interval:  365 QTC Calculation: 459 R Axis:   60  Text Interpretation: Unknown rhythm, irregular rate Borderline short PR interval Probable LVH with secondary repol abnrm No significant change since last tracing Confirmed by Emil Share [phone]) on 07/22/2024 4:04:01 PM  Radiology: No results found.  Procedures   Medications Ordered in the ED  lactated ringers  bolus 1,000 mL (has no administration in time range)    Medical Decision Making Amount and/or Complexity of Data Reviewed Labs: ordered. Radiology: ordered.   83 y.o. female presents to the ER for evaluation of dehydration/fatigue. Differential diagnosis includes but is not limited to dehydration, electrolyte imbalance, infection, failure to thrive. Vital signs mildly elevated blood pressure otherwise unremarkable. Physical exam as noted above.   With EMS, patient was found to be hypotensive and tachycardic systolically in the 80s and tachycardic into the 140s.   She improved after 800 cc of fluids.  Blood pressure normalized and heart rate within normal limits.  Likely due to dehydration and failure to thrive.  Will continue to give patient some IV fluids.  Patient has no complaint other than fatigue.  Broad lab workup.  Chest x-ray ordered given cold environment.  I handed off to my attending to see to completion of workup and patient care.   Portions of this report may have been transcribed using voice recognition software. Every effort was made to ensure accuracy; however, inadvertent computerized transcription errors may be present.    Final diagnoses:  None    ED Discharge Orders     None          Bernis Ernst, [place] 07/22/24 1618    Emil Share, DO 07/22/24 1816

## 2024-07-22 NOTE — ED Triage Notes (Signed)
 BIB EMS from home r/t increased weakness since Monday. Family call for welfare check d/t lack of comminution this week. EMS found patient hypotensive and tachycardic. EMS administered 800ml NS fluids with improved BP and HR. Patient shivering.

## 2024-07-22 NOTE — Sepsis Progress Note (Signed)
 Code Sepsis protocol being monitored by eLink.

## 2024-07-22 NOTE — H&P (Signed)
 History and Physical    Patient: Monica Faulkner FMW:[id number] DOB: 21-May-1941 DOA: 07/22/2024 DOS: the patient was seen and examined on 07/22/2024 PCP: Verdia Lombard, MD  Patient coming from: Home  Chief Complaint:  Chief Complaint  Patient presents with   Weakness   HPI: Monica Faulkner Carbo is a 83 y.o. female with medical history significant of basal cell carcinoma, type 2 diabetes, GERD, essential hypertension, anxiety disorder, depression, who was brought in from home with increasing weakness since Monday.  Patient lives alone and usually independent.  Has not been seen by family since Monday.  They were called for welfare check due to lack of communication but could not get the patient.  EMS dispatch found the patient hypotensive and tachycardic in bed.  On arrival he admits that 800 mL of normal saline and blood pressure started improving.  Patient brought to the ER for evaluation.  In the ER code sepsis was activated initially.  Patient had SIRS tachycardia as well as with elevated white count.  She was markedly dehydrated.  Low-grade temperature.  AKI as well as significant hyperglycemia.  Further workup however showed no Evidence of infection.  Urinalysis and chest x-ray as well as CT abdomen and pelvis did not show any source of infection.  At this point patient is being admitted with suspicion of severe elevation leading to acute metabolic encephalopathy, AKI and other electrolyte derangements.  Patient also has lactic acidosis probably from dehydration.  Review of Systems: As mentioned in the history of present illness. All other systems reviewed and are negative. Past Medical History:  Diagnosis Date   Anxiety    seen at [facility], for extreme anxiety. As a result of her anxiety  she had ECHO   Cancer (HCC)    basal cell    Complication of anesthesia    atropine  increased BP & increased pulse   Depression    Diabetes mellitus    type 2- 2006, managed with diet   GERD  (gastroesophageal reflux disease)    rare occurence   H/O hiatal hernia    Headache(784.0)    caused by sinus congestion fr. time to time    Hypertension    Past Surgical History:  Procedure Laterality Date   ABDOMINAL HYSTERECTOMY     1991-scar later had to be further repaired    APPENDECTOMY     L ovarian cyst - ruptured & appendectomy done at same time.    EYE SURGERY     R eye- macular hole repair- 2008   GAS INSERTION  03/18/2012   Procedure: INSERTION OF GAS;  Surgeon: Norleen JONETTA Ku, MD;  Location: [facility] OR;  Service: Ophthalmology;  Laterality: Left;   HERNIA REPAIR     1974- inguinal repair   PARS PLANA VITRECTOMY  03/18/2012   Procedure: PARS PLANA VITRECTOMY WITH 25 GAUGE;  Surgeon: Norleen JONETTA Ku, MD;  Location: [facility] OR;  Service: Ophthalmology;  Laterality: Left;  25 GAUGE PPV FOR MACULAR HOLE   TEAR DUCT PROBING     as an infant   TONSILLECTOMY     and adenioidectomy- as a child   VITRECTOMY  03/18/2012   Social History:  reports that she has never smoked. She has never used smokeless tobacco. She reports that she does not drink alcohol and does not use drugs.  Allergies  Allergen Reactions   Avapro [Irbesartan] Other (See Comments)    Difficulty breathing, feeling like she was going to pass out   Calcium Channel Blockers  ineffective   Decongestant [Pseudoephedrine] Other (See Comments)    Heart races , blood pressure goes up   Norvasc [Amlodipine Besylate] Itching, Other (See Comments) and Hypertension    Turned skin red   Sulfa Antibiotics Other (See Comments)    Made kidney infection worse   Zofran  [Ondansetron ] Diarrhea    Only ODT tablets cause this problem.     History reviewed. No pertinent family history.  Prior to Admission medications   Medication Sig Start Date End Date Taking? Authorizing Provider  atenolol  (TENORMIN ) 25 MG tablet Take 25 mg by mouth every evening.    [provider]  cholecalciferol (VITAMIN D) 1000 units tablet  Take 2,000 Units by mouth daily.    [provider]  clonazePAM  (KLONOPIN ) 0.5 MG tablet Take 1 mg by mouth at bedtime. May take additional 1mg  as needed for sleep 04/23/18   [provider]  cloNIDine  (CATAPRES ) 0.2 MG tablet Take 0.2 mg by mouth at bedtime.  04/23/18   [provider]  diphenhydrAMINE  (BENADRYL ) 25 mg capsule Take 25 mg by mouth at bedtime as needed for allergies.     [provider]  escitalopram  (LEXAPRO ) 10 MG tablet Take 10 mg by mouth every morning.    [provider]  famotidine  (PEPCID ) 40 MG tablet Take 1 tablet (40 mg total) by mouth daily for 15 days. 04/30/21 05/15/21  Tobie Litter, MD  ibuprofen  (ADVIL ,MOTRIN ) 200 MG tablet Take 200 mg by mouth daily as needed for fever or mild pain.     [provider]  JANUVIA 100 MG tablet Take 100 mg by mouth daily. 04/01/18   [provider]  Loperamide  HCl (IMODIUM  A-D PO) Take 2 tablets by mouth every hour as needed (diarrhea).    [provider]  metoCLOPramide  (REGLAN ) 10 MG tablet Take 1 tablet (10 mg total) by mouth 3 (three) times daily before meals for 10 days. 04/30/21 05/10/21  Tobie Litter, MD  Naphazoline HCl (CLEAR EYES OP) Place 1 drop into both eyes daily as needed (dry eyes).    [provider]  ondansetron  (ZOFRAN ) 4 MG tablet Take 1 tablet (4 mg total) by mouth every 6 (six) hours. Patient taking differently: Take 4 mg by mouth every 6 (six) hours as needed for nausea or vomiting. 04/19/21   Emelia Sluder, PA-C  ondansetron  (ZOFRAN -ODT) 4 MG disintegrating tablet Take 1 tablet (4 mg total) by mouth every 8 (eight) hours as needed. 11/15/23   Griselda Norris, MD  pravastatin  (PRAVACHOL ) 20 MG tablet Take 20 mg by mouth every other day.  02/24/18   [provider]  telmisartan (MICARDIS) 80 MG tablet Take 80 mg by mouth daily. 04/17/21   [provider]  zolpidem (AMBIEN) 5 MG tablet Take 5 mg by mouth at bedtime as needed for sleep.  06/14/18   [provider]    Physical Exam: Vitals:   07/22/24 1524 07/22/24 1600 07/22/24 1615 07/22/24 1630  BP: (!) 146/75 (!) 148/68 (!) 165/70 (!) 184/72  Pulse: 90 86 84 87  Resp: 20 (!) 25 (!) 23 (!) 26  Temp: 99.4 F (37.4 C)     TempSrc: Rectal     SpO2: 97% 96% 96% 97%   Constitutional: Chronically ill looking and weak, confused NAD, calm, comfortable Eyes: PERRL, lids and conjunctivae normal ENMT: Mucous membranes are moist. Posterior pharynx clear of any exudate or lesions.Normal dentition.  Neck: normal, supple, no masses, no thyromegaly Respiratory: clear to auscultation bilaterally, no wheezing,  no crackles. Normal respiratory effort. No accessory muscle use.  Cardiovascular: Sinus tachycardia, no murmurs / rubs / gallops. No extremity edema. 2+ pedal pulses. No carotid bruits.  Abdomen: no tenderness, no masses palpated. No hepatosplenomegaly. Bowel sounds positive.  Musculoskeletal: Good range of motion, no joint swelling or tenderness, Skin: no rashes, lesions, ulcers. No induration Neurologic: CN 2-12 grossly intact. Sensation intact, DTR normal. Strength 5/5 in all 4.  Psychiatric: Confused, disoriented to person and place  Data Reviewed:  Temperature 99.4, blood pressure 184/72, pulse 101, respiratory 28, white count 24.6, hemoglobin 17.3 platelets 344.  CO2 19 BUN 62 creatinine 2.92 calcium 9.8 and glucose 284.  Urine drug screen is negative.  Chest x-ray showed no acute findings.  CT abdomen pelvis without contrast showed no acute findings.  Urinalysis essentially negative.  Hemoglobin A1c 6.7 lactic acid 3.9 improved to 1.7 after hydration  Assessment and Plan:  #1. Acute metabolic encephalopathy: Multifactorial.  Most likely secondary to severe dehydration and SARS.  Patient also has AKI as well as multiple other derangements.  Patient will be admitted to monitored bed.  Aggressive hydration.  Monitor electrolytes.  Monitor on the labs.  Get PT and  OT evaluation.  Not sure if patient has had a fall.  #2 severe dehydration: Aggressively hydrate and monitor patient.  #3 leukocytosis: White count elevated most likely from dehydration.  No evidence of infection.  Continue to monitor  #4 type 2 diabetes: Initiate sliding scale insulin   #5 GERD: Continue with PPIs  #6 AKI: Aggressively hydrate and monitor.  #7 lactic acidosis: Getting better with hydration.  Continue monitoring  #8 essential hypertension: Confirm and resume pain medication    Advance Care Planning:   Code Status: Full Code   Consults: None but PT OT  Family Communication: No family at bedside  Severity of Illness: The appropriate patient status for this patient is INPATIENT. Inpatient status is judged to be reasonable and necessary in order to provide the required intensity of service to ensure the patient's safety. The patient's presenting symptoms, physical exam findings, and initial radiographic and laboratory data in the context of their chronic comorbidities is felt to place them at high risk for further clinical deterioration. Furthermore, it is not anticipated that the patient will be medically stable for discharge from the hospital within 2 midnights of admission.   * I certify that at the point of admission it is my clinical judgment that the patient will require inpatient hospital care spanning beyond 2 midnights from the point of admission due to high intensity of service, high risk for further deterioration and high frequency of surveillance required.*  AuthorBETHA SIM KNOLL, MD 07/22/2024 6:21 PM  For on call review [email or web address].

## 2024-07-22 NOTE — ED Provider Notes (Addendum)
 Patient with fatigue.  Hypotensive on scene improved with fluids.  Not doing well at home.   Plan for admission for dehydration post labs.    Patient's lab work with lactate 3.9.  Blood pressure stable but with borderline febrile and white count of 25,000 will activate code sepsis.  UA without obvious infection.  Chest x-ray independently interpreted by me without obvious focal infiltrate.  Metabolic panel is resulted with significant elevation of BUN and creatinine.  I think most likely the lactate elevation was due to dehydration.  No obvious infectious source found.  On repeat exam she is complaining of abdominal pain now.  Will obtain CT imaging.  Ct without acute finding.   CRITICAL CARE Performed by: Toribio Belvie Quale   Total critical care time: 35 minutes  Critical care time was exclusive of separately billable procedures and treating other patients.  Critical care was necessary to treat or prevent imminent or life-threatening deterioration.  Critical care was time spent personally by me on the following activities: development of treatment plan with patient and/or surrogate as well as nursing, discussions with consultants, evaluation of patient's response to treatment, examination of patient, obtaining history from patient or surrogate, ordering and performing treatments and interventions, ordering and review of laboratory studies, ordering and review of radiographic studies, pulse oximetry and re-evaluation of patient's condition.    Quale Share, DO 07/22/24 1816    Quale Share, DO 07/22/24 [place]

## 2024-07-23 ENCOUNTER — Other Ambulatory Visit: Payer: Self-pay

## 2024-07-23 ENCOUNTER — Inpatient Hospital Stay (HOSPITAL_COMMUNITY)

## 2024-07-23 DIAGNOSIS — I700 Atherosclerosis of aorta: Secondary | ICD-10-CM | POA: Diagnosis not present

## 2024-07-23 DIAGNOSIS — D72829 Elevated white blood cell count, unspecified: Secondary | ICD-10-CM | POA: Diagnosis not present

## 2024-07-23 LAB — CBC
HCT: 47.3 % — ABNORMAL HIGH (ref 36.0–46.0)
Hemoglobin: 16.2 g/dL — ABNORMAL HIGH (ref 12.0–15.0)
MCH: 31.8 pg (ref 26.0–34.0)
MCHC: 34.2 g/dL (ref 30.0–36.0)
MCV: 92.9 fL (ref 80.0–100.0)
Platelets: 273 K/uL (ref 150–400)
RBC: 5.09 MIL/uL (ref 3.87–5.11)
RDW: 12.1 % (ref 11.5–15.5)
WBC: 26.4 K/uL — ABNORMAL HIGH (ref 4.0–10.5)
nRBC: 0 % (ref 0.0–0.2)

## 2024-07-23 LAB — BLOOD CULTURE ID PANEL (REFLEXED) - BCID2

## 2024-07-23 LAB — COMPREHENSIVE METABOLIC PANEL WITH GFR
ALT: 13 U/L (ref 0–44)
AST: 34 U/L (ref 15–41)
Albumin: 2.9 g/dL — ABNORMAL LOW (ref 3.5–5.0)
Alkaline Phosphatase: 55 U/L (ref 38–126)
Anion gap: 16 — ABNORMAL HIGH (ref 5–15)
BUN: 58 mg/dL — ABNORMAL HIGH (ref 8–23)
CO2: 23 mmol/L (ref 22–32)
Calcium: 9.4 mg/dL (ref 8.9–10.3)
Chloride: 102 mmol/L (ref 98–111)
Creatinine, Ser: 1.93 mg/dL — ABNORMAL HIGH (ref 0.44–1.00)
GFR, Estimated: 26 mL/min — ABNORMAL LOW (ref >60)
Glucose, Bld: 190 mg/dL — ABNORMAL HIGH (ref 70–99)
Potassium: 3.4 mmol/L — ABNORMAL LOW (ref 3.5–5.1)
Sodium: 141 mmol/L (ref 135–145)
Total Bilirubin: 1 mg/dL (ref 0.0–1.2)
Total Protein: 5.8 g/dL — ABNORMAL LOW (ref 6.5–8.1)

## 2024-07-23 LAB — GLUCOSE, CAPILLARY
Glucose-Capillary: 169 mg/dL — ABNORMAL HIGH (ref 70–99)
Glucose-Capillary: 144 mg/dL — ABNORMAL HIGH (ref 70–99)
Glucose-Capillary: 120 mg/dL — ABNORMAL HIGH (ref 70–99)

## 2024-07-23 MED ORDER — POTASSIUM CHLORIDE 20 MEQ PO PACK
40.0000 meq | PACK | Freq: Once | ORAL | Status: AC
  Administered 2024-07-23: 40 meq via ORAL
  Filled 2024-07-23: qty 2

## 2024-07-23 MED ORDER — CLONAZEPAM 0.5 MG PO TABS
0.5000 mg | ORAL_TABLET | Freq: Every day | ORAL | Status: DC
  Administered 2024-07-23: 0.5 mg via ORAL
  Filled 2024-07-23: qty 1

## 2024-07-23 MED ORDER — ATENOLOL 50 MG PO TABS
25.0000 mg | ORAL_TABLET | Freq: Every evening | ORAL | Status: DC
  Administered 2024-07-23 – 2024-07-27 (×5): 25 mg via ORAL
  Filled 2024-07-23 (×5): qty 1

## 2024-07-23 MED ORDER — CLONIDINE HCL 0.1 MG PO TABS
0.1000 mg | ORAL_TABLET | Freq: Two times a day (BID) | ORAL | Status: DC
  Administered 2024-07-23 – 2024-07-24 (×2): 0.1 mg via ORAL
  Filled 2024-07-23 (×2): qty 1

## 2024-07-23 MED ORDER — VANCOMYCIN HCL 500 MG/100ML IV SOLN
500.0000 mg | INTRAVENOUS | Status: DC
  Filled 2024-07-23: qty 100

## 2024-07-23 MED ORDER — ENSURE PLUS HIGH PROTEIN PO LIQD
237.0000 mL | Freq: Two times a day (BID) | ORAL | Status: DC
  Administered 2024-07-23 – 2024-07-28 (×9): 237 mL via ORAL

## 2024-07-23 NOTE — Progress Notes (Signed)
 Progress Note   Patient: Monica Faulkner FMW:[id number] DOB: 09-22-1940 DOA: 07/22/2024     1 DOS: the patient was seen and examined on 07/23/2024    Brief hospital course: Monica Faulkner is a 83 y.o. female with medical history significant of basal cell carcinoma, type 2 diabetes, GERD, essential hypertension, anxiety disorder, depression, who was brought in from home with increasing weakness for 4 days.  Patient lives alone and usually independent. EMS dispatch found the patient hypotensive and tachycardic in bed. On arrival he admits that 800 mL of normal saline and blood pressure started improving.   Patient was noted to be markedly dehydrated, with low-grade fever, AKI, hyperglycemia, acute encephalopathy.  Of note, patient's brother, who lives in [place] , states his sister has not been coping well since her roommate passed away about 6 years ago. Family wanted patient to move somewhere she could get more help, but she refused.  Brother reports that the patient's house was found in a deplorable state and is currently uninhabitable.  Assessment and Plan:  Acute metabolic encephalopathy Patient lives alone at home at baseline. Presented with some confusion. Likely due to metabolic derangements and possible infection. - Mental status is improving.  Sepsis Present on admission, evidenced by lactic acidosis in the setting of infection. Most likely due to bacteremia. Lactic acidosis of 3.9 on presentation, improved with hydration Presented with leukocytosis of greater than 20. She denies urinary symptoms. UA somewhat suggestive of infection. Chest x-ray done on admission showed no acute changes. CT abdomen pelvis showed cholelithiasis without cholecystitis, moderate amount of stool in the rectum suggesting fecal impaction diverticulosis but no acute diverticulitis. Blood cultures growing GPC's. - Will treat with vancomycin  as indicated below. -Repeat CXR as patient has findings on  lung exam. - Will continue to monitor vitals closely.  Bacteremia Blood cultures drawn on admission on 12/5 are growing GPC's. - Pharmacy consult for vancomycin . Repeat blood cultures in AM.  Weakness Likely due to dehydration, poor p.o. intake at home prior to admission, infection. - PT/OT  Dehydration AKI Patient presented with creatinine of 2.92 from baseline of <1. Likely prerenal due to hypovolemia 2/2 poor p.o. intake Creatinine is trending down with IV fluid. - Continue IV fluids.  Hypokalemia - Will replete as needed.  T2DM On Januvia at home. - Hold home medications. - Diabetic diet. - Sliding scale insulin .  HTN - Resume home atenolol , clonidine  (at lower dose) - Hold home telmisartan given AKI.  Anxiety On Klonopin  and zolpidem at home. PDMP reviewed. - Will resume home Klonopin . -Will hold zolpidem for now  Mild exophthalmos TSH: 2.284  Weakness Family says she is not coping well.  - PT/OT evaluation - Will likely need placement.      Subjective: Patient is responsive.  He denies cough.  Denies dysuria.  Physical Exam: BP (!) 148/102 (BP Location: Right Arm)   Pulse 100   Temp 97.7 F (36.5 C)   Resp 18   Ht 5' 5 (1.651 m)   Wt 60.5 kg   SpO2 94%   BMI 22.20 kg/m    Physical Exam   General: Alert, oriented X2 Eyes: Pupils equal, reactive  Oral cavity: moist mucous membranes  Head: Atraumatic, normocephalic  Neck: supple  Chest: Left basal crackles CVS: S1,S2 RRR. No murmurs  Abd: No distention, soft, non-tender. No masses palpable  Extr: No edema   MSK: No joint deformities or swelling  Neurological: Grossly intact.    Data Reviewed:    Latest  Ref Rng & Units 07/23/2024    5:36 AM 07/22/2024   10:16 PM 07/22/2024    4:20 PM  CBC  WBC 4.0 - 10.5 K/uL 26.4  28.3    Hemoglobin 12.0 - 15.0 g/dL 83.7  82.6  82.6   Hematocrit 36.0 - 46.0 % 47.3  50.7  51.0   Platelets 150 - 400 K/uL 273  303        Latest Ref Rng & Units  07/23/2024    5:36 AM 07/22/2024   10:16 PM 07/22/2024    4:20 PM  BMP  Glucose 70 - 99 mg/dL 809     BUN 8 - 23 mg/dL 58     Creatinine 9.55 - 1.00 mg/dL 8.06  7.64    Sodium 864 - 145 mmol/L 141   136   Potassium 3.5 - 5.1 mmol/L 3.4   3.8   Chloride 98 - 111 mmol/L 102     CO2 22 - 32 mmol/L 23     Calcium 8.9 - 10.3 mg/dL 9.4        Family Communication: I called and spoke with the patient's brother, Monica Faulkner.   Disposition: Status is: Inpatient Remains inpatient appropriate because: Requires IV antibiotics for bacteremia, IV fluids for AKI.      Author: MDALA-GAUSI, Monica Fohl [name], MD 07/23/2024 3:21 PM  For on call review [email or web address].

## 2024-07-23 NOTE — Evaluation (Signed)
 Physical Therapy Evaluation Patient Details Name: Monica Faulkner MRN: [id number] DOB: Nov 17, 1940 Today's Date: 07/23/2024  History of Present Illness  Pt is an 83 y/o F who presented to [facility] 12/5  for evaluation after wellness check. Pt was found down at her house with poor living conditions noted by EMS - trash, rodents, old food. Pt recently c/o fatigue, reports she has not gotten out of bed for several days. PMHx: HTN, DM, anxiety.  Clinical Impression  Pt presents with admitting diagnosis above. Co-treat with OT. Pt today was able to transfer to and from [facility] with +2 Max A. During transfer back to bed pt noted with episode of decreased responsiveness and shaking. VS upon return to supine after near LOC and full body shakes episode: BP - 110/79 (86), HR 160s-40s (fluctuating), SpO2 96% on RA. RN notified. Unsure of patient baseline however per chart review lived alone. Patient will benefit from continued inpatient follow up therapy, <3 hours/day. PT will continue to follow.         If plan is discharge home, recommend the following: Two people to help with walking and/or transfers;A lot of help with bathing/dressing/bathroom;Assistance with cooking/housework;Assistance with feeding;Direct supervision/assist for financial management;Direct supervision/assist for medications management;Assist for transportation;Help with stairs or ramp for entrance;Supervision due to cognitive status   Can travel by private vehicle   No    Equipment Recommendations None recommended by PT  Recommendations for Other Services       Functional Status Assessment Patient has had a recent decline in their functional status and demonstrates the ability to make significant improvements in function in a reasonable and predictable amount of time.     Precautions / Restrictions Precautions Precautions: Fall Recall of Precautions/Restrictions: Impaired Precaution/Restrictions Comments: delirium prevention  precautions Restrictions Weight Bearing Restrictions Per Provider Order: No      Mobility  Bed Mobility Overal bed mobility: Needs Assistance Bed Mobility: Supine to Sit, Sit to Supine     Supine to sit: Max assist, HOB elevated Sit to supine: Total assist, +2 for physical assistance, +2 for safety/equipment   General bed mobility comments: max multimodal sequencing cues to transition EOB, incr A to return to supine d/t full body shakes and poor command following    Transfers Overall transfer level: Needs assistance Equipment used: 2 person hand held assist, 1 person hand held assist Transfers: Sit to/from Stand, Bed to chair/wheelchair/BSC Sit to Stand: Mod assist, +2 physical assistance, +2 safety/equipment Stand pivot transfers: Max assist, +2 physical assistance, +2 safety/equipment         General transfer comment: Stood from bed via B HHA, needed max A +2 to SPT to BSC<>bed, poor sequencing and approach. Minimal LE movement during pivot transfers.    Ambulation/Gait               General Gait Details: Deferred  Stairs            Wheelchair Mobility     Tilt Bed    Modified Rankin (Stroke Patients Only)       Balance Overall balance assessment: Needs assistance Sitting-balance support: Single extremity supported, Feet supported Sitting balance-Leahy Scale: Fair Sitting balance - Comments: sitting EOB, CGA for safety Postural control: Posterior lean Standing balance support: Bilateral upper extremity supported, During functional activity Standing balance-Leahy Scale: Poor Standing balance comment: max A +2 to maintain and during dynamic pivot  Pertinent Vitals/Pain Pain Assessment Pain Assessment: Faces Faces Pain Scale: No hurt    Home Living Family/patient expects to be discharged to:: Private residence Living Arrangements: Alone                 Additional Comments: per notes pt from home  alone    Prior Function Prior Level of Function : Patient poor historian/Family not available               ADLs Comments: no family present to confirm PLOF     Extremity/Trunk Assessment   Upper Extremity Assessment Upper Extremity Assessment: Generalized weakness    Lower Extremity Assessment Lower Extremity Assessment: Generalized weakness    Cervical / Trunk Assessment Cervical / Trunk Assessment: Kyphotic  Communication   Communication Communication: Impaired Factors Affecting Communication: Difficulty expressing self    Cognition Arousal: Alert Behavior During Therapy: Flat affect, Anxious                             Following commands: Impaired Following commands impaired: Follows one step commands inconsistently, Follows one step commands with increased time     Cueing Cueing Techniques: Verbal cues, Gestural cues     General Comments General comments (skin integrity, edema, etc.): VS upon return to supine after near LOC and full body shakes episode: BP - 110/79 (86), HR 160s-40s (fluctuating), SpO2 96% on RA. RN notified.    Exercises     Assessment/Plan    PT Assessment Patient needs continued PT services  PT Problem List Decreased strength;Decreased range of motion;Decreased balance;Decreased activity tolerance;Decreased mobility;Decreased coordination;Decreased cognition;Decreased knowledge of use of DME;Decreased safety awareness;Decreased knowledge of precautions       PT Treatment Interventions DME instruction;Gait training;Stair training;Therapeutic activities;Functional mobility training;Therapeutic exercise;Balance training;Neuromuscular re-education;Cognitive remediation;Patient/family education    PT Goals (Current goals can be found in the Care Plan section)  Acute Rehab PT Goals PT Goal Formulation: Patient unable to participate in goal setting Time For Goal Achievement: 08/06/24 Potential to Achieve Goals: Fair     Frequency Min 2X/week     Co-evaluation               AM-PAC PT 6 Clicks Mobility  Outcome Measure Help needed turning from your back to your side while in a flat bed without using bedrails?: A Lot Help needed moving from lying on your back to sitting on the side of a flat bed without using bedrails?: A Lot Help needed moving to and from a bed to a chair (including a wheelchair)?: A Lot Help needed standing up from a chair using your arms (e.g., wheelchair or bedside chair)?: A Lot Help needed to walk in hospital room?: Total Help needed climbing 3-5 steps with a railing? : Total 6 Click Score: 10    End of Session   Activity Tolerance: Patient tolerated treatment well;Patient limited by fatigue Patient left: in bed;with call bell/phone within reach;with bed alarm set;with nursing/sitter in room Nurse Communication: Mobility status PT Visit Diagnosis: Other abnormalities of gait and mobility (R26.89)    Time: 8794-8764 PT Time Calculation (min) (ACUTE ONLY): 30 min   Charges:   PT Evaluation $PT Eval Moderate Complexity: 1 Mod   PT General Charges $$ ACUTE PT VISIT: 1 Visit         Sueellen NOVAK, PT, DPT Acute Rehab Services [phone]   Taha Dimond 07/23/2024, 4:22 PM

## 2024-07-23 NOTE — Plan of Care (Signed)

## 2024-07-23 NOTE — TOC Initial Note (Signed)
 Transition of Care [facility]) - Initial/Assessment Note    Patient Details  Name: Monica Faulkner MRN: [id number] Date of Birth: Aug 13, 1941  [facility] CM/SW Contact:    Sherline Clack, LCSWA Phone Number: 07/23/2024, 11:53 AM  Clinical Narrative:                  CSW received message from nurse saying patient's brother had questions about patient. CSW called patient's brother, [name], who lives in [place] . [name] had questions about disposition. CSW informed [name] patient had not been seen by PT yet. CSW will reach back out to family when PT has put in recommendations. Family requests CSW submits Medicaid screening referral. CSW sent email to [facility] for review. CSW will continue to follow.    Barriers to Discharge: Continued Medical Work up   Patient Goals and CMS Choice            Expected Discharge Plan and Services       Living arrangements for the past 2 months: Single Family Home                                      Prior Living Arrangements/Services Living arrangements for the past 2 months: Single Family Home                     Activities of Daily Living   ADL Screening (condition at time of admission) Independently performs ADLs?: Yes (appropriate for developmental age) Is the patient deaf or have difficulty hearing?: No Does the patient have difficulty seeing, even when wearing glasses/contacts?: No Does the patient have difficulty concentrating, remembering, or making decisions?: Yes  Permission Sought/Granted                  Emotional Assessment Appearance:: Appears stated age Attitude/Demeanor/Rapport: Unable to Assess Affect (typically observed): Unable to Assess Orientation: : Oriented to Self, Oriented to Place      Admission diagnosis:  Dehydration [E86.0] Acute metabolic encephalopathy [G93.41] Patient Active Problem List   Diagnosis Date Noted   Acute metabolic encephalopathy  07/22/2024   Controlled type 2 diabetes mellitus without complication, without long-term current use of insulin  (HCC) 07/22/2024   Essential hypertension 07/22/2024   GERD (gastroesophageal reflux disease) 07/22/2024   Dehydration 07/22/2024   Leucocytosis 07/22/2024   Lactic acid acidosis 07/22/2024   AKI (acute kidney injury) 07/22/2024   Hyponatremia 07/02/2018   Macular hole 02/18/2012   PCP:  Verdia Lombard, MD Pharmacy:   [id number] [place],  - [address] AT [facility] [address] [facility] [address] [place] [address] Phone: [phone] Fax: [phone]     Social Drivers of Health (SDOH) Social History: SDOH Screenings   Food Insecurity: Patient Unable To Answer (07/23/2024)  Housing: Patient Unable To Answer (07/23/2024)  Transportation Needs: Patient Unable To Answer (07/23/2024)  Utilities: Patient Unable To Answer (07/23/2024)  Social Connections: Patient Unable To Answer (07/23/2024)  Tobacco Use: Low Risk  (07/22/2024)   SDOH Interventions:     Readmission Risk Interventions     No data to display

## 2024-07-23 NOTE — Evaluation (Signed)
 Occupational Therapy Evaluation Patient Details Name: Monica Faulkner MRN: [id number] DOB: 14-Aug-1941 Today's Date: 07/23/2024   History of Present Illness   Pt is an 83 y/o F who presented to [facility] 12/5  for evaluation after wellness check. Pt was found down at her house with poor living conditions noted by EMS - trash, rodents, old food. Pt recently c/o fatigue, reports she has not gotten out of bed for several days. PMHx: HTN, DM, anxiety.     Clinical Impressions Pt greeted in bed, no family/visitors at bedside. Pt is a poor historian and unable to provide PLOF info; per notes pt found down at home after a wellness check. Pt presents today generally weak, with poor and inconsistent command following, and significant cog deficits. Poor sequencing and intermittently verbally communicative. Functionally, she needed mod-max A for all ADLs and was mod A +2 for sit<>stand and max A +2 for SPT BSC<>bed. Pt with full body shakes and minimal alertness when standing for hygiene, returned to supine urgently and VS obtained. BP stable, but pt initially tachy with rates in 160s, then brady'd down to 40bpm. RN alerted immediately and came to bedside to assess. Pt never fully lost consciousness.   Pt is currently functioning below baseline and would benefit from ongoing acute OT services to progress towards safe discharge and to facilitate return to prior level of function. Current recommendation is post-acute rehab (< 3 hours/day).     If plan is discharge home, recommend the following:   Two people to help with walking and/or transfers;A lot of help with bathing/dressing/bathroom;Assistance with cooking/housework;Direct supervision/assist for medications management;Assist for transportation;Direct supervision/assist for financial management;Help with stairs or ramp for entrance;Supervision due to cognitive status     Functional Status Assessment   Patient has had a recent decline in their functional  status and demonstrates the ability to make significant improvements in function in a reasonable and predictable amount of time.     Equipment Recommendations   Other (comment) (defer to next level of care)     Recommendations for Other Services   PT consult     Precautions/Restrictions   Precautions Precautions: Fall Recall of Precautions/Restrictions: Impaired Precaution/Restrictions Comments: delirium prevention precautions Restrictions Weight Bearing Restrictions Per Provider Order: No     Mobility Bed Mobility Overal bed mobility: Needs Assistance Bed Mobility: Supine to Sit, Sit to Supine     Supine to sit: Max assist, HOB elevated Sit to supine: Total assist, +2 for physical assistance, +2 for safety/equipment   General bed mobility comments: max multimodal sequencing cues to transition EOB, incr A to return to supine d/t full body shakes and poor command following    Transfers Overall transfer level: Needs assistance   Transfers: Sit to/from Stand, Bed to chair/wheelchair/BSC Sit to Stand: Mod assist, +2 physical assistance, +2 safety/equipment Stand pivot transfers: Max assist, +2 physical assistance, +2 safety/equipment         General transfer comment: Stood from bed via B HHA, needed max A +2 to SPT to BSC<>bed, poor sequencing and approach. Minimal LE movement during pivot transfers.      Balance Overall balance assessment: Needs assistance Sitting-balance support: Single extremity supported, Feet supported Sitting balance-Leahy Scale: Fair Sitting balance - Comments: sitting EOB, CGA for safety Postural control: Posterior lean Standing balance support: Bilateral upper extremity supported, During functional activity Standing balance-Leahy Scale: Poor Standing balance comment: max A +2 to maintain and during dynamic pivot  ADL either performed or assessed with clinical judgement   ADL Overall ADL's : Needs  assistance/impaired Eating/Feeding: Minimal assistance   Grooming: Bed level;Moderate assistance   Upper Body Bathing: Maximal assistance   Lower Body Bathing: Total assistance   Upper Body Dressing : Maximal assistance   Lower Body Dressing: Total assistance   Toilet Transfer: Maximal assistance;+2 for physical assistance;+2 for safety/equipment   Toileting- Clothing Manipulation and Hygiene: Total assistance       Functional mobility during ADLs: Maximal assistance;+2 for physical assistance;+2 for safety/equipment       Vision Baseline Vision/History:  (no eye glasses present at time of OT eval)       Perception         Praxis         Pertinent Vitals/Pain Pain Assessment Pain Assessment: Faces Faces Pain Scale: No hurt     Extremity/Trunk Assessment Upper Extremity Assessment Upper Extremity Assessment: Generalized weakness   Lower Extremity Assessment Lower Extremity Assessment: Generalized weakness   Cervical / Trunk Assessment Cervical / Trunk Assessment: Kyphotic   Communication Communication Communication: Impaired Factors Affecting Communication: Difficulty expressing self   Cognition Arousal: Alert Behavior During Therapy: Flat affect, Anxious Cognition: Cognition impaired   Orientation impairments: Time, Situation (stated [facility]) Awareness: Intellectual awareness impaired Memory impairment (select all impairments): Short-term memory, Working civil service fast streamer, Non-declarative long-term memory, Geneticist, Molecular long-term memory Attention impairment (select first level of impairment): Focused attention Executive functioning impairment (select all impairments): Initiation, Organization, Sequencing, Reasoning, Problem solving OT - Cognition Comments: flat affect, inconsistently following 1-step commands, delayed processing during conversation & task execution                 Following commands: Impaired Following commands impaired: Follows one  step commands inconsistently, Follows one step commands with increased time     Cueing  General Comments   Cueing Techniques: Verbal cues;Gestural cues  VS upon return to supine after near LOC and full body shakes episode: BP - 110/79 (86), HR 160s-40s (fluctuating), SpO2 96% on RA. RN notified.   Exercises     Shoulder Instructions      Home Living Family/patient expects to be discharged to:: Private residence Living Arrangements: Alone                               Additional Comments: per notes pt from home alone      Prior Functioning/Environment Prior Level of Function : Patient poor historian/Family not available               ADLs Comments: no family present to confirm PLOF    OT Problem List: Decreased strength;Decreased activity tolerance;Impaired balance (sitting and/or standing);Decreased cognition;Decreased safety awareness   OT Treatment/Interventions: Self-care/ADL training;Therapeutic exercise;Energy conservation;DME and/or AE instruction;Therapeutic activities;Cognitive remediation/compensation;Patient/family education;Balance training      OT Goals(Current goals can be found in the care plan section)   Acute Rehab OT Goals Patient Stated Goal: did not state OT Goal Formulation: Patient unable to participate in goal setting (cog) Time For Goal Achievement: 08/06/24 Potential to Achieve Goals: Fair   OT Frequency:  Min 2X/week    Co-evaluation              AM-PAC OT 6 Clicks Daily Activity     Outcome Measure Help from another person eating meals?: A Little Help from another person taking care of personal grooming?: A Lot Help from another person toileting, which includes using toliet,  bedpan, or urinal?: Total Help from another person bathing (including washing, rinsing, drying)?: A Lot Help from another person to put on and taking off regular upper body clothing?: A Lot Help from another person to put on and taking off  regular lower body clothing?: Total 6 Click Score: 11   End of Session Nurse Communication: Mobility status;Other (comment) (pt status/VS at end of session)  Activity Tolerance: Other (comment) (limited by cognitive impairments & poor command following) Patient left: in bed;with call bell/phone within reach;with bed alarm set  OT Visit Diagnosis: Unsteadiness on feet (R26.81);Other abnormalities of gait and mobility (R26.89);Other symptoms and signs involving cognitive function                Time: 8794-8765 OT Time Calculation (min): 29 min Charges:  OT General Charges $OT Visit: 1 Visit OT Evaluation $OT Eval Moderate Complexity: 1 Mod  Nyshawn Gowdy M. [place], OTR/L [facility] Acute Rehabilitation Services [phone] Secure Chat Preferred  Rashana Andrew 07/23/2024, 3:47 PM

## 2024-07-23 NOTE — Progress Notes (Signed)
 PHARMACY - PHYSICIAN COMMUNICATION CRITICAL VALUE ALERT - BLOOD CULTURE IDENTIFICATION (BCID)  Monica Faulkner is an 83 y.o. female who presented to [facility] on 07/22/2024 with a chief complaint of weakness.  Assessment:  Micro called with BCID 1/2 bottles with staph species. Cont vanc for now until ID and sensitivities are back.   Name of physician (or Provider) Contacted: Dr Jearlean  Current antibiotics: Vanc  Changes to prescribed antibiotics recommended:  Continue vanc  Results for orders placed or performed during the hospital encounter of 07/22/24  Blood Culture ID Panel (Reflexed) (Collected: 07/22/2024  4:42 PM)  Result Value Ref Range   Enterococcus faecalis NOT DETECTED NOT DETECTED   Enterococcus Faecium NOT DETECTED NOT DETECTED   Listeria monocytogenes NOT DETECTED NOT DETECTED   Staphylococcus species DETECTED (A) NOT DETECTED   Staphylococcus aureus (BCID) NOT DETECTED NOT DETECTED   Staphylococcus epidermidis NOT DETECTED NOT DETECTED   Staphylococcus lugdunensis NOT DETECTED NOT DETECTED   Streptococcus species NOT DETECTED NOT DETECTED   Streptococcus agalactiae NOT DETECTED NOT DETECTED   Streptococcus pneumoniae NOT DETECTED NOT DETECTED   Streptococcus pyogenes NOT DETECTED NOT DETECTED   A.calcoaceticus-baumannii NOT DETECTED NOT DETECTED   Bacteroides fragilis NOT DETECTED NOT DETECTED   Enterobacterales NOT DETECTED NOT DETECTED   Enterobacter cloacae complex NOT DETECTED NOT DETECTED   Escherichia coli NOT DETECTED NOT DETECTED   Klebsiella aerogenes NOT DETECTED NOT DETECTED   Klebsiella oxytoca NOT DETECTED NOT DETECTED   Klebsiella pneumoniae NOT DETECTED NOT DETECTED   Proteus species NOT DETECTED NOT DETECTED   Salmonella species NOT DETECTED NOT DETECTED   Serratia marcescens NOT DETECTED NOT DETECTED   Haemophilus influenzae NOT DETECTED NOT DETECTED   Neisseria meningitidis NOT DETECTED NOT DETECTED   Pseudomonas aeruginosa NOT  DETECTED NOT DETECTED   Stenotrophomonas maltophilia NOT DETECTED NOT DETECTED   Candida albicans NOT DETECTED NOT DETECTED   Candida auris NOT DETECTED NOT DETECTED   Candida glabrata NOT DETECTED NOT DETECTED   Candida krusei NOT DETECTED NOT DETECTED   Candida parapsilosis NOT DETECTED NOT DETECTED   Candida tropicalis NOT DETECTED NOT DETECTED   Cryptococcus neoformans/gattii NOT DETECTED NOT DETECTED    Sergio Batch, PharmD, BCIDP, AAHIVP, CPP Infectious Disease Pharmacist 07/23/2024 4:04 PM

## 2024-07-23 NOTE — Progress Notes (Signed)
 Pharmacy Antibiotic Note  Monica Faulkner is a 83 y.o. female admitted on 07/22/2024 with bacteremia.  Pharmacy has been consulted for vanc dosing.  Pt admitted for weakness. Only one set of blood culture was collected. One bottle is showing GPC. She received one dose of vanc yesterday. Vanc ordered to be continued. BCID should be back soon.  Scr 1.93  Plan: Vanc 500mg  IV q24>>AUC 537, scr 1.93 F/u BCID   Height: 5' 5 (165.1 cm) Weight: 60.5 kg (133 lb 6.1 oz) IBW/kg (Calculated) : 57  Temp (24hrs), Avg:98.3 F (36.8 C), Min:97.7 F (36.5 C), Max:99 F (37.2 C)  Recent Labs  Lab 07/22/24 1600 07/22/24 1631 07/22/24 1850 07/22/24 2216 07/23/24 0536  WBC 24.6*  --   --  28.3* 26.4*  CREATININE 2.92*  --   --  2.35* 1.93*  LATICACIDVEN  --  3.9* 1.7 2.7*  --     Estimated Creatinine Clearance: 20.2 mL/min (A) (by C-G formula based on SCr of 1.93 mg/dL (H)).    Allergies  Allergen Reactions   Avapro  [Irbesartan ] Other (See Comments)    Difficulty breathing, feeling like she was going to pass out   Calcium Channel Blockers     ineffective   Decongestant [Pseudoephedrine] Other (See Comments)    Heart races , blood pressure goes up   Norvasc [Amlodipine Besylate] Itching, Other (See Comments) and Hypertension    Turned skin red   Sulfa Antibiotics Other (See Comments)    Made kidney infection worse   Zofran  [Ondansetron ] Diarrhea    Only ODT tablets cause this problem.     Antimicrobials this admission: 12/5 vanc>> 12/5 cefepime  x1 12/5 flagyl  x1  Dose adjustments this admission:   Microbiology results: 12/5 blood x1>>GPC   Sergio Batch, PharmD, BCIDP, AAHIVP, CPP Infectious Disease Pharmacist 07/23/2024 3:43 PM

## 2024-07-24 ENCOUNTER — Inpatient Hospital Stay (HOSPITAL_COMMUNITY)

## 2024-07-24 LAB — BASIC METABOLIC PANEL WITH GFR
Anion gap: 6 (ref 5–15)
BUN: 48 mg/dL — ABNORMAL HIGH (ref 8–23)
CO2: 29 mmol/L (ref 22–32)
Calcium: 8.5 mg/dL — ABNORMAL LOW (ref 8.9–10.3)
Chloride: 110 mmol/L (ref 98–111)
Creatinine, Ser: 1.37 mg/dL — ABNORMAL HIGH (ref 0.44–1.00)
GFR, Estimated: 39 mL/min — ABNORMAL LOW (ref >60)
Glucose, Bld: 156 mg/dL — ABNORMAL HIGH (ref 70–99)
Potassium: 3.3 mmol/L — ABNORMAL LOW (ref 3.5–5.1)
Sodium: 145 mmol/L (ref 135–145)

## 2024-07-24 LAB — CBC
HCT: 35.6 % — ABNORMAL LOW (ref 36.0–46.0)
Hemoglobin: 11.8 g/dL — ABNORMAL LOW (ref 12.0–15.0)
MCH: 31.4 pg (ref 26.0–34.0)
MCHC: 33.1 g/dL (ref 30.0–36.0)
MCV: 94.7 fL (ref 80.0–100.0)
Platelets: 220 K/uL (ref 150–400)
RBC: 3.76 MIL/uL — ABNORMAL LOW (ref 3.87–5.11)
RDW: 12.4 % (ref 11.5–15.5)
WBC: 15.2 K/uL — ABNORMAL HIGH (ref 4.0–10.5)
nRBC: 0 % (ref 0.0–0.2)

## 2024-07-24 LAB — HEPATIC FUNCTION PANEL
ALT: 13 U/L (ref 0–44)
AST: 27 U/L (ref 15–41)
Albumin: 2.3 g/dL — ABNORMAL LOW (ref 3.5–5.0)
Alkaline Phosphatase: 38 U/L (ref 38–126)
Bilirubin, Direct: 0.2 mg/dL (ref 0.0–0.2)
Indirect Bilirubin: 0.7 mg/dL (ref 0.3–0.9)
Total Bilirubin: 0.9 mg/dL (ref 0.0–1.2)
Total Protein: 4.5 g/dL — ABNORMAL LOW (ref 6.5–8.1)

## 2024-07-24 LAB — GLUCOSE, CAPILLARY
Glucose-Capillary: 149 mg/dL — ABNORMAL HIGH (ref 70–99)
Glucose-Capillary: 174 mg/dL — ABNORMAL HIGH (ref 70–99)
Glucose-Capillary: 135 mg/dL — ABNORMAL HIGH (ref 70–99)
Glucose-Capillary: 183 mg/dL — ABNORMAL HIGH (ref 70–99)

## 2024-07-24 LAB — MAGNESIUM: Magnesium: 1.7 mg/dL (ref 1.7–2.4)

## 2024-07-24 MED ORDER — CLONIDINE HCL 0.1 MG PO TABS
0.2000 mg | ORAL_TABLET | Freq: Once | ORAL | Status: AC
  Administered 2024-07-24: 0.2 mg via ORAL
  Filled 2024-07-24: qty 2

## 2024-07-24 MED ORDER — CLONIDINE HCL 0.1 MG PO TABS
0.2000 mg | ORAL_TABLET | Freq: Once | ORAL | Status: AC
  Administered 2024-07-25: 0.2 mg via ORAL
  Filled 2024-07-24: qty 2

## 2024-07-24 MED ORDER — POTASSIUM CHLORIDE 10 MEQ/100ML IV SOLN
10.0000 meq | INTRAVENOUS | Status: AC
  Administered 2024-07-24 (×2): 10 meq via INTRAVENOUS
  Filled 2024-07-24 (×3): qty 100

## 2024-07-24 MED ORDER — CLONIDINE HCL 0.1 MG PO TABS: 0.3000 mg | ORAL_TABLET | Freq: Two times a day (BID) | ORAL | Status: DC

## 2024-07-24 MED ORDER — SODIUM CHLORIDE 0.9 % IV SOLN: INTRAVENOUS | Status: DC

## 2024-07-24 MED ORDER — SODIUM CHLORIDE 0.9 % IV BOLUS: 500.0000 mL | Freq: Once | INTRAVENOUS | Status: DC

## 2024-07-24 MED ORDER — SODIUM CHLORIDE 0.9 % IV SOLN
1.0000 g | INTRAVENOUS | Status: DC
  Administered 2024-07-24 – 2024-07-26 (×3): 1 g via INTRAVENOUS
  Filled 2024-07-24 (×3): qty 10

## 2024-07-24 MED ORDER — VANCOMYCIN HCL 500 MG/100ML IV SOLN
500.0000 mg | INTRAVENOUS | Status: DC
  Administered 2024-07-24: 500 mg via INTRAVENOUS
  Filled 2024-07-24: qty 100

## 2024-07-24 MED ORDER — LABETALOL HCL 5 MG/ML IV SOLN
5.0000 mg | Freq: Once | INTRAVENOUS | Status: AC
  Administered 2024-07-24: 5 mg via INTRAVENOUS
  Filled 2024-07-24: qty 4

## 2024-07-24 MED ORDER — SODIUM CHLORIDE 0.9 % IV BOLUS
1000.0000 mL | Freq: Once | INTRAVENOUS | Status: AC
  Administered 2024-07-24: 1000 mL via INTRAVENOUS

## 2024-07-24 MED ORDER — SODIUM CHLORIDE 0.9 % IV SOLN
500.0000 mg | INTRAVENOUS | Status: DC
  Administered 2024-07-24 – 2024-07-25 (×2): 500 mg via INTRAVENOUS
  Filled 2024-07-24 (×3): qty 5

## 2024-07-24 MED ORDER — POTASSIUM CHLORIDE 10 MEQ/100ML IV SOLN
10.0000 meq | INTRAVENOUS | Status: AC
  Administered 2024-07-24 (×2): 10 meq via INTRAVENOUS
  Filled 2024-07-24: qty 100

## 2024-07-24 MED ORDER — POTASSIUM CHLORIDE 10 MEQ/100ML IV SOLN: 10.0000 meq | INTRAVENOUS | Status: DC

## 2024-07-24 MED ORDER — CLONIDINE HCL 0.1 MG PO TABS
0.1000 mg | ORAL_TABLET | Freq: Two times a day (BID) | ORAL | Status: DC
  Administered 2024-07-24: 0.1 mg via ORAL
  Filled 2024-07-24: qty 1

## 2024-07-24 MED ORDER — SODIUM CHLORIDE 0.9 % IV SOLN
INTRAVENOUS | Status: DC
  Administered 2024-07-24: 100 mL/h via INTRAVENOUS

## 2024-07-24 NOTE — Progress Notes (Signed)
 Progress Note   Patient: Monica Faulkner FMW:[id number] DOB: [birth year] DOA: 07/22/2024     2 DOS: the patient was seen and examined on 07/24/2024    Brief hospital course: Monica Faulkner is a 83 y.o. female with medical history significant of basal cell carcinoma, type 2 diabetes, GERD, essential hypertension, anxiety disorder, depression, who was brought in from home with increasing weakness for 4 days.  Patient lives alone and usually independent. EMS dispatch found the patient hypotensive and tachycardic in bed. On arrival he admits that 800 mL of normal saline and blood pressure started improving.   Patient was noted to be markedly dehydrated, with low-grade fever, AKI, hyperglycemia, acute encephalopathy.  Of note, patient's brother, who lives in [place] , states his sister has not been coping well since her roommate passed away about 6 years ago. Family wanted patient to move somewhere she could get more help, but she refused.  Brother reports that the patient's house was found in a deplorable state and is currently uninhabitable.  Assessment and Plan:  Acute metabolic encephalopathy Patient lives alone at home at baseline. Presented with some confusion. Likely due to metabolic derangements and possible infection. Mental status worsened on 12/7, likely due to hypotension in the setting of hypovolemia, home clonidine  dose.  Home clonazepam  may also be a contributor. - Reduce clonidine  dose back to 0.1 mg twice daily. - Hold home clonazepam . - Maintenance fluids. - Follow-up on infectious workup.  Sepsis Present on admission, evidenced by lactic acidosis in the setting of infection. Most likely due to bacteremia. Lactic acidosis of 3.9 on presentation, improved with hydration Presented with leukocytosis of greater than 20. She denies urinary symptoms. UA somewhat suggestive of infection. Chest x-ray done on admission showed no acute changes. Repeat CXR seems to show LLL  infiltrate.  CT abdomen pelvis showed cholelithiasis without cholecystitis, moderate amount of stool in the rectum suggesting fecal impaction diverticulosis but no acute diverticulitis. Blood cultures grew Staph hominis (CoNS) - contaminant.  - DC Vancomycin  as positive cultures most likely due to contaminant.  - Will treat for pneumonia.   Community-acquired pneumonia Patient has findings on lung exam and chest x-ray suggestive of left lower lobe pneumonia. - DC vancomycin . - Start ceftriaxone , azithromycin .  Positive blood cultures Blood cultures drawn on admission on 12/5 grew Staph hominis in 1/2 sets. Likely contaminant. - DC vancomycin .  Weakness Likely due to dehydration, poor p.o. intake at home prior to admission, infection. - PT/OT  Dehydration AKI Patient presented with creatinine of 2.92 from baseline of <1. Likely prerenal due to hypovolemia 2/2 poor p.o. intake Creatinine is trending down with IV fluid. - Continue IV fluids.  Hypokalemia - Will replete as needed.  T2DM On Januvia at home. - Hold home medications. - Diabetic diet. - Hold insulin  sliding scale as patient is hardly eating.  HTN - Resumed home atenolol , clonidine  (at lower dose) - Hold home telmisartan given AKI.  Anxiety On Klonopin  and zolpidem at home. PDMP reviewed. -Resumed home Klonopin  but will hold for now as patient has been very lethargic. -Will hold zolpidem for now  Mild exophthalmos TSH: 2.284  Weakness Family says she is not coping well.  - PT/OT evaluation - Will likely need placement.      Subjective/Interval History:   Patient noted to have elevated blood pressure of 214/101 this morning. Home dose of clonidine  0.3 mg twice daily was resumed. Noted during rounds the patient was barely responsive.GCS was 8. Blood pressure checked and was  101/45.  Blood sugar 182. Patient was given a fluid bolus and was slightly more arousable. CT head was ordered and  completed.  CT head showed no acute changes.  Patient was reevaluated after the CT. She was arousable although lethargic.  She responded to me verbally. BP 168/52.  Patient evaluated again in the afternoon.  Continues to appear lethargic, but responds to verbal stimuli and was obeying commands.  Physical Exam: BP (!) 168/52 (BP Location: Right Arm)   Pulse 60   Temp 98.6 F (37 C) (Axillary)   Resp (!) 22   Ht 5' 5 (1.651 m)   Wt 60.5 kg   SpO2 96%   BMI 22.20 kg/m    Physical Exam   General: Lethargic Eyes: Pupils equal, reactive  Oral cavity: moist mucous membranes  Head: Atraumatic, normocephalic  Neck: supple  Chest: Left basal crackles CVS: S1,S2 RRR. No murmurs  Abd: No distention, soft, non-tender. No masses palpable  Extr: No edema   MSK: No joint deformities or swelling  Neurological: Grossly intact.    Data Reviewed:    Latest Ref Rng & Units 07/24/2024   11:28 AM 07/23/2024    5:36 AM 07/22/2024   10:16 PM  CBC  WBC 4.0 - 10.5 K/uL 15.2  26.4  28.3   Hemoglobin 12.0 - 15.0 g/dL 88.1  83.7  82.6   Hematocrit 36.0 - 46.0 % 35.6  47.3  50.7   Platelets 150 - 400 K/uL 220  273  303       Latest Ref Rng & Units 07/23/2024    5:36 AM 07/22/2024   10:16 PM 07/22/2024    4:20 PM  BMP  Glucose 70 - 99 mg/dL 809     BUN 8 - 23 mg/dL 58     Creatinine 9.55 - 1.00 mg/dL 8.06  7.64    Sodium 864 - 145 mmol/L 141   136   Potassium 3.5 - 5.1 mmol/L 3.4   3.8   Chloride 98 - 111 mmol/L 102     CO2 22 - 32 mmol/L 23     Calcium 8.9 - 10.3 mg/dL 9.4        Family Communication: I called and spoke with the patient's brother, Monica Faulkner.  Disposition: Status is: Inpatient Remains inpatient appropriate because: Requires IV antibiotics for bacteremia, IV fluids for AKI.      Author: MDALA-GAUSI, GOLDEN PILLOW, MD 07/24/2024 12:52 PM  For on call review [email or web address].

## 2024-07-24 NOTE — Plan of Care (Signed)

## 2024-07-25 LAB — GLUCOSE, CAPILLARY
Glucose-Capillary: 185 mg/dL — ABNORMAL HIGH (ref 70–99)
Glucose-Capillary: 182 mg/dL — ABNORMAL HIGH (ref 70–99)
Glucose-Capillary: 154 mg/dL — ABNORMAL HIGH (ref 70–99)
Glucose-Capillary: 184 mg/dL — ABNORMAL HIGH (ref 70–99)
Glucose-Capillary: 209 mg/dL — ABNORMAL HIGH (ref 70–99)
Glucose-Capillary: 141 mg/dL — ABNORMAL HIGH (ref 70–99)

## 2024-07-25 LAB — CULTURE, BLOOD (SINGLE)

## 2024-07-25 LAB — BASIC METABOLIC PANEL WITH GFR
Anion gap: 14 (ref 5–15)
BUN: 36 mg/dL — ABNORMAL HIGH (ref 8–23)
CO2: 24 mmol/L (ref 22–32)
Calcium: 8.6 mg/dL — ABNORMAL LOW (ref 8.9–10.3)
Chloride: 108 mmol/L (ref 98–111)
Creatinine, Ser: 1.09 mg/dL — ABNORMAL HIGH (ref 0.44–1.00)
GFR, Estimated: 51 mL/min — ABNORMAL LOW (ref >60)
Glucose, Bld: 141 mg/dL — ABNORMAL HIGH (ref 70–99)
Potassium: 3.8 mmol/L (ref 3.5–5.1)
Sodium: 146 mmol/L — ABNORMAL HIGH (ref 135–145)

## 2024-07-25 LAB — MAGNESIUM: Magnesium: 1.5 mg/dL — ABNORMAL LOW (ref 1.7–2.4)

## 2024-07-25 LAB — CBC
HCT: 37 % (ref 36.0–46.0)
Hemoglobin: 12.5 g/dL (ref 12.0–15.0)
MCH: 31.9 pg (ref 26.0–34.0)
MCHC: 33.8 g/dL (ref 30.0–36.0)
MCV: 94.4 fL (ref 80.0–100.0)
Platelets: 233 K/uL (ref 150–400)
RBC: 3.92 MIL/uL (ref 3.87–5.11)
RDW: 12.4 % (ref 11.5–15.5)
WBC: 12.6 K/uL — ABNORMAL HIGH (ref 4.0–10.5)
nRBC: 0 % (ref 0.0–0.2)

## 2024-07-25 MED ORDER — CLONAZEPAM 0.25 MG PO TBDP
0.2500 mg | ORAL_TABLET | Freq: Every day | ORAL | Status: DC
  Administered 2024-07-25: 0.25 mg via ORAL
  Filled 2024-07-25: qty 1

## 2024-07-25 MED ORDER — INSULIN ASPART 100 UNIT/ML IJ SOLN
0.0000 [IU] | Freq: Three times a day (TID) | INTRAMUSCULAR | Status: AC
  Administered 2024-07-25: 3 [IU] via SUBCUTANEOUS
  Administered 2024-07-25 – 2024-07-27 (×5): 1 [IU] via SUBCUTANEOUS
  Administered 2024-07-27 – 2024-07-28 (×3): 2 [IU] via SUBCUTANEOUS
  Filled 2024-07-25 (×2): qty 1
  Filled 2024-07-25 (×3): qty 2
  Filled 2024-07-25: qty 1
  Filled 2024-07-25: qty 3
  Filled 2024-07-25 (×2): qty 1

## 2024-07-25 MED ORDER — CLONIDINE HCL 0.1 MG PO TABS
0.2000 mg | ORAL_TABLET | Freq: Two times a day (BID) | ORAL | Status: DC
  Administered 2024-07-25 – 2024-07-28 (×6): 0.2 mg via ORAL
  Filled 2024-07-25 (×6): qty 2

## 2024-07-25 MED ORDER — CLONIDINE HCL 0.1 MG PO TABS
0.3000 mg | ORAL_TABLET | Freq: Two times a day (BID) | ORAL | Status: DC
  Administered 2024-07-25: 0.3 mg via ORAL
  Filled 2024-07-25: qty 3

## 2024-07-25 MED ORDER — MAGNESIUM SULFATE 2 GM/50ML IV SOLN
2.0000 g | Freq: Once | INTRAVENOUS | Status: AC
  Administered 2024-07-25: 2 g via INTRAVENOUS
  Filled 2024-07-25: qty 50

## 2024-07-25 NOTE — NC FL2 (Signed)
 [facility]  MEDICAID FL2 LEVEL OF CARE FORM     IDENTIFICATION  Patient Name: Monica Faulkner Birthdate: Feb 27, 1941 Sex: female Admission Date (Current Location): 07/22/2024  [facility] and [place] Number:  Producer, Television/film/video and Address:  [facility] [place]. [facility], [address] [place], [place] [address]      Provider Number: [id number]  Attending Physician Name and Address:  Jearlean Harpin Agat*  Relative Name and Phone Number:  Ariauna Farabee: [phone]    Current Level of Care: Hospital Recommended Level of Care: Skilled Nursing Facility Prior Approval Number:    Date Approved/Denied:   PASRR Number: [id number]  Discharge Plan: SNF    Current Diagnoses: Patient Active Problem List   Diagnosis Date Noted   Acute metabolic encephalopathy 07/22/2024   Controlled type 2 diabetes mellitus without complication, without long-term current use of insulin  (HCC) 07/22/2024   Essential hypertension 07/22/2024   GERD (gastroesophageal reflux disease) 07/22/2024   Dehydration 07/22/2024   Leucocytosis 07/22/2024   Lactic acid acidosis 07/22/2024   AKI (acute kidney injury) 07/22/2024   Hyponatremia 07/02/2018   Macular hole 02/18/2012    Orientation RESPIRATION BLADDER Height & Weight     Self, Time, Situation  Normal Continent Weight: 133 lb 6.1 oz (60.5 kg) Height:  5' 5 (165.1 cm)  BEHAVIORAL SYMPTOMS/MOOD NEUROLOGICAL BOWEL NUTRITION STATUS      Continent Diet (please refer to dc summary)  AMBULATORY STATUS COMMUNICATION OF NEEDS Skin   Limited Assist Verbally Normal                       Personal Care Assistance Level of Assistance  Bathing, Dressing, Feeding Bathing Assistance: Limited assistance Feeding assistance: Independent Dressing Assistance: Limited assistance     Functional Limitations Info  Sight, Speech, Hearing Sight Info: Adequate Hearing Info: Adequate Speech Info: Adequate    SPECIAL CARE FACTORS FREQUENCY  PT (By  licensed PT), OT (By licensed OT)     PT Frequency: 5x/week OT Frequency: 5x/week            Contractures Contractures Info: Not present    Additional Factors Info  Code Status, Allergies Code Status Info: Full Allergies Info: Avapro , Calcium Channel Blockers, Decongestant (pseudoephedrine), Norvasc (amlodipine Besylate), Sulfa Antibiotics, Zofran            Current Medications (07/25/2024):  This is the current hospital active medication list Current Facility-Administered Medications  Medication Dose Route Frequency Provider Last Rate Last Admin   atenolol  (TENORMIN ) tablet 25 mg  25 mg Oral QPM Mdala-Gausi, Masiku [name], MD   25 mg at 07/24/24 1843   azithromycin  (ZITHROMAX ) 500 mg in sodium chloride  0.9 % 250 mL IVPB  500 mg Intravenous Q24H Mdala-Gausi, Masiku [name], MD 250 mL/hr at 07/24/24 1726 500 mg at 07/24/24 1726   cefTRIAXone  (ROCEPHIN ) 1 g in sodium chloride  0.9 % 100 mL IVPB  1 g Intravenous Q24H Mdala-Gausi, Masiku [name], MD 200 mL/hr at 07/24/24 1612 1 g at 07/24/24 1612   clonazePAM  (KLONOPIN ) disintegrating tablet 0.25 mg  0.25 mg Oral QHS Mdala-Gausi, Masiku [name], MD       cloNIDine  (CATAPRES ) tablet 0.3 mg  0.3 mg Oral BID Franky Redia SAILOR, MD   0.3 mg at 07/25/24 9390   feeding supplement (ENSURE PLUS HIGH PROTEIN) liquid 237 mL  237 mL Oral BID BM Mdala-Gausi, Masiku [name], MD   237 mL at 07/25/24 0959   heparin  injection 5,000 Units  5,000 Units Subcutaneous Q8H Sim Re  L, MD   5,000 Units at 07/25/24 0535   magnesium  sulfate IVPB 2 g 50 mL  2 g Intravenous Once Mdala-Gausi, Masiku [name], MD 50 mL/hr at 07/25/24 0959 2 g at 07/25/24 9040     Discharge Medications: Please see discharge summary for a list of discharge medications.  Relevant Imaging Results:  Relevant Lab Results:   Additional Information Pt's SSN: [id number]  Sherline Clack, LCSWA

## 2024-07-25 NOTE — Care Management Important Message (Signed)
 Important Message  Patient Details  Name: Monica Faulkner MRN: [id number] Date of Birth: 18-Jun-1941   Important Message Given:  Yes - Medicare IM     Claretta Deed 07/25/2024, 10:57 AM

## 2024-07-25 NOTE — Progress Notes (Addendum)
 Progress Note   Patient: Monica Faulkner FMW:[id number] DOB: 1940/09/14 DOA: 07/22/2024     3 DOS: the patient was seen and examined on 07/25/2024    Brief hospital course: [name] is a 83 y.o. female with medical history significant of basal cell carcinoma, type 2 diabetes, GERD, essential hypertension, anxiety disorder, depression, who was brought in from home with increasing weakness for 4 days.  Patient lives alone and usually independent. EMS dispatch found the patient hypotensive and tachycardic in bed. On arrival he admits that 800 mL of normal saline and blood pressure started improving.   Patient was noted to be markedly dehydrated, with low-grade fever, AKI, hyperglycemia, acute encephalopathy.  Of note, patient's brother, who lives in [place] , states his sister has not been coping well since her roommate passed away about 6 years ago. Family wanted patient to move somewhere she could get more help, but she refused.  Brother reports that the patient's house was found in a deplorable state and is currently uninhabitable.  Assessment and Plan:  Acute metabolic encephalopathy Patient lives alone at home at baseline. Presented with some confusion. Likely due to metabolic derangements and possible infection. Mental status worsened on 12/7, likely due to hypotension in the setting of hypovolemia, home clonidine  dose.   Encephalopathy has resolved. -Will resume home clonazepam  at half her prior dose as this is a chronic medication for the patient. - Will monitor closely. - Continue to treat infection.    Sepsis Present on admission, evidenced by lactic acidosis in the setting of infection. Most likely due to pneumonia. Lactic acidosis of 3.9 on presentation, improved with hydration Presented with leukocytosis of greater than 20. She denies urinary symptoms. UA somewhat suggestive of infection. Chest x-ray done on admission showed no acute changes. Repeat CXR seems to  show LLL infiltrate.  CT abdomen pelvis showed cholelithiasis without cholecystitis, moderate amount of stool in the rectum suggesting fecal impaction diverticulosis but no acute diverticulitis. Blood cultures grew Staph hominis (CoNS) - contaminant.  -Continue antibiotics for pneumonia  Community-acquired pneumonia Patient has findings on lung exam and chest x-ray suggestive of left lower lobe pneumonia. -Vancomycin  discontinued. - Continue ceftriaxone , azithromycin .  Positive blood cultures Blood cultures drawn on admission on 12/5 grew Staph hominis in 1/2 sets. Likely contaminant. -Vancomycin  discontinued  Weakness Likely due to dehydration, poor p.o. intake at home prior to admission, infection. Family says she is not coping well, and is no longer able to care for herself in her home.  Patient seen by PT/OT and SNF recommended. - For placement.  Dehydration AKI Patient presented with creatinine of 2.92 from baseline of <1. Likely prerenal due to hypovolemia 2/2 poor p.o. intake Creatinine trended down with IV fluid. -IV fluids now discontinued. - Encourage p.o. fluids.  Hypokalemia Hypomagnesemia - Will replete as needed.  T2DM On Januvia at home. - Hold home medications. - Diabetic diet. - Resume insulin  sliding scale 12/8 as patient now eating.  HTN - Resumed home atenolol , clonidine  - Hold home telmisartan given AKI.  Anxiety On Klonopin  and zolpidem at home. PDMP reviewed. -Resumed home Klonopin  - reduced dose. -Will hold zolpidem for now  Mild exophthalmos TSH: 2.284       Subjective/Interval History:   Patient is doing much better today. She is fully awake, oriented.   Physical Exam: BP (!) 148/61 (BP Location: Left Arm)   Pulse 64   Temp 98.4 F (36.9 C) (Oral)   Resp 20   Ht 5' 5 (1.651  m)   Wt 60.5 kg   SpO2 95%   BMI 22.20 kg/m    Physical Exam   General: Awake, alert, oriented x 3 Eyes: Pupils equal, reactive  Oral  cavity: moist mucous membranes  Head: Atraumatic, normocephalic  Neck: supple  Chest: Left basal crackles CVS: S1,S2 RRR. No murmurs  Abd: No distention, soft, non-tender. No masses palpable  Extr: No edema   MSK: No joint deformities or swelling  Neurological: Grossly intact.    Data Reviewed:    Latest Ref Rng & Units 07/25/2024    3:57 AM 07/24/2024   11:28 AM 07/23/2024    5:36 AM  CBC  WBC 4.0 - 10.5 K/uL 12.6  15.2  26.4   Hemoglobin 12.0 - 15.0 g/dL 87.4  88.1  83.7   Hematocrit 36.0 - 46.0 % 37.0  35.6  47.3   Platelets 150 - 400 K/uL 233  220  273       Latest Ref Rng & Units 07/25/2024    3:57 AM 07/24/2024   11:28 AM 07/23/2024    5:36 AM  BMP  Glucose 70 - 99 mg/dL 858  843  809   BUN 8 - 23 mg/dL 36  48  58   Creatinine 0.44 - 1.00 mg/dL 8.90  8.62  8.06   Sodium 135 - 145 mmol/L 146  145  141   Potassium 3.5 - 5.1 mmol/L 3.8  3.3  3.4   Chloride 98 - 111 mmol/L 108  110  102   CO2 22 - 32 mmol/L 24  29  23    Calcium 8.9 - 10.3 mg/dL 8.6  8.5  9.4      Family Communication: I called and spoke with the patient's brother, Anaisha Mago.  Disposition: Status is: Inpatient Remains inpatient appropriate because: Requires IV antibiotics for pneumonia.       Author: MDALA-GAUSI, Danahi Reddish [name], MD 07/25/2024 11:31 AM  For on call review [email or web address].

## 2024-07-25 NOTE — Care Management Important Message (Signed)
 Important Message  Patient Details  Name: Monica Faulkner MRN: [id number] Date of Birth: 10/16/1940   Important Message Given:  Yes - Medicare IM     Claretta Deed 07/25/2024, 4:19 PM

## 2024-07-25 NOTE — Plan of Care (Signed)
  Problem: Coping: Goal: Ability to adjust to condition or change in health will improve Outcome: Progressing   Problem: Skin Integrity: Goal: Risk for impaired skin integrity will decrease Outcome: Progressing   Problem: Education: Goal: Knowledge of General Education information will improve Description: Including pain rating scale, medication(s)/side effects and non-pharmacologic comfort measures Outcome: Progressing   Problem: Activity: Goal: Risk for activity intolerance will decrease Outcome: Progressing   Problem: Nutrition: Goal: Adequate nutrition will be maintained Outcome: Progressing

## 2024-07-25 NOTE — Plan of Care (Signed)

## 2024-07-26 LAB — GLUCOSE, CAPILLARY
Glucose-Capillary: 150 mg/dL — ABNORMAL HIGH (ref 70–99)
Glucose-Capillary: 138 mg/dL — ABNORMAL HIGH (ref 70–99)
Glucose-Capillary: 138 mg/dL — ABNORMAL HIGH (ref 70–99)
Glucose-Capillary: 157 mg/dL — ABNORMAL HIGH (ref 70–99)

## 2024-07-26 LAB — BASIC METABOLIC PANEL WITH GFR
Anion gap: 16 — ABNORMAL HIGH (ref 5–15)
BUN: 24 mg/dL — ABNORMAL HIGH (ref 8–23)
CO2: 17 mmol/L — ABNORMAL LOW (ref 22–32)
Calcium: 8.1 mg/dL — ABNORMAL LOW (ref 8.9–10.3)
Chloride: 103 mmol/L (ref 98–111)
Creatinine, Ser: 0.83 mg/dL (ref 0.44–1.00)
GFR, Estimated: >60 mL/min (ref >60)
Glucose, Bld: 144 mg/dL — ABNORMAL HIGH (ref 70–99)
Potassium: 3.3 mmol/L — ABNORMAL LOW (ref 3.5–5.1)
Sodium: 136 mmol/L (ref 135–145)

## 2024-07-26 LAB — CBC
HCT: 35.9 % — ABNORMAL LOW (ref 36.0–46.0)
Hemoglobin: 12.3 g/dL (ref 12.0–15.0)
MCH: 32.2 pg (ref 26.0–34.0)
MCHC: 34.3 g/dL (ref 30.0–36.0)
MCV: 94 fL (ref 80.0–100.0)
Platelets: 194 K/uL (ref 150–400)
RBC: 3.82 MIL/uL — ABNORMAL LOW (ref 3.87–5.11)
RDW: 12 % (ref 11.5–15.5)
WBC: 10.3 K/uL (ref 4.0–10.5)
nRBC: 0 % (ref 0.0–0.2)

## 2024-07-26 LAB — MAGNESIUM: Magnesium: 1.8 mg/dL (ref 1.7–2.4)

## 2024-07-26 MED ORDER — CLONIDINE HCL 0.1 MG PO TABS
0.1000 mg | ORAL_TABLET | Freq: Once | ORAL | Status: AC
  Administered 2024-07-26: 0.1 mg via ORAL
  Filled 2024-07-26: qty 1

## 2024-07-26 MED ORDER — AZITHROMYCIN 500 MG PO TABS
500.0000 mg | ORAL_TABLET | Freq: Once | ORAL | Status: AC
  Administered 2024-07-26: 500 mg via ORAL
  Filled 2024-07-26: qty 1

## 2024-07-26 MED ORDER — CLONAZEPAM 0.25 MG PO TBDP
0.5000 mg | ORAL_TABLET | Freq: Every day | ORAL | Status: DC
  Administered 2024-07-26 – 2024-07-27 (×2): 0.5 mg via ORAL
  Filled 2024-07-26 (×2): qty 2

## 2024-07-26 MED ORDER — POTASSIUM CHLORIDE CRYS ER 20 MEQ PO TBCR
40.0000 meq | EXTENDED_RELEASE_TABLET | Freq: Once | ORAL | Status: AC
  Administered 2024-07-26: 40 meq via ORAL
  Filled 2024-07-26: qty 2

## 2024-07-26 NOTE — Progress Notes (Addendum)
 Progress Note   Patient: Monica Faulkner DOB: [birth year] DOA: 07/22/2024     4 DOS: the patient was seen and examined on 07/26/2024    Brief hospital course: Monica Faulkner is a 83 y.o. female with medical history significant of basal cell carcinoma, type 2 diabetes, GERD, essential hypertension, anxiety disorder, depression, who was brought in from home with increasing weakness for 4 days.  Patient lives alone and usually independent. EMS dispatch found the patient hypotensive and tachycardic in bed. On arrival they administered 800 mL of normal saline and blood pressure started improving.   Patient was noted to be markedly dehydrated, with low-grade fever, AKI, hyperglycemia, acute encephalopathy.  Of note, patient's brother, who lives in [place] , states his sister has not been coping well since her roommate passed away about 6 years ago. Family wanted patient to move somewhere she could get more help, but she refused.  Brother reports that the patient's house was found in a deplorable state and is currently uninhabitable.  Assessment and Plan:  Acute metabolic encephalopathy Patient lives alone at home at baseline. Presented with some confusion. Likely due to metabolic derangements and infection. Mental status worsened on 12/7, likely due to hypotension in the setting of hypovolemia, home clonidine  dose.   Encephalopathy has resolved. - Will continue home clonazepam , but will not resume Ambien.  - Will monitor closely. - Continue to treat infection.    Sepsis Present on admission, evidenced by lactic acidosis in the setting of infection. Most likely due to pneumonia. Lactic acidosis of 3.9 on presentation, improved with hydration Presented with leukocytosis of greater than 20. She denies urinary symptoms. UA somewhat suggestive of infection. Chest x-ray done on admission showed no acute changes. Repeat CXR seems to show LLL infiltrate.  CT abdomen pelvis showed  cholelithiasis without cholecystitis, moderate amount of stool in the rectum suggesting fecal impaction diverticulosis but no acute diverticulitis. Blood cultures grew Staph hominis (CoNS) - contaminant.  -Continue antibiotics for pneumonia  Community-acquired pneumonia Patient has findings on lung exam and chest x-ray suggestive of left lower lobe pneumonia. -Vancomycin  discontinued. - Continue ceftriaxone , azithromycin .  Positive blood cultures Blood cultures drawn on admission on 12/5 grew Staph hominis in 1/2 sets. Likely contaminant. -Vancomycin  discontinued  Weakness Likely due to dehydration, poor p.o. intake at home prior to admission, infection. Family says she is not coping well, and is no longer able to care for herself in her home.  Patient seen by PT/OT and SNF recommended. - For placement.  Dehydration AKI Patient presented with creatinine of 2.92 from baseline of <1. Likely prerenal due to hypovolemia 2/2 poor p.o. intake Creatinine trended down with IV fluid. -IV fluids now discontinued. - Encourage p.o. fluids.  Hypokalemia Hypomagnesemia - Will replete as needed.  T2DM On Januvia at home. - Hold home medications. - Diabetic diet. - Resumed insulin  sliding scale 12/8 as patient now eating.  HTN - Resumed home atenolol , clonidine  - Held home telmisartan 2/2 AKI. Will resume at discharge.   Anxiety On Klonopin  and zolpidem at home. PDMP reviewed. -Resumed home Klonopin . -Will not resume zolpidem.   Mild exophthalmos TSH: 2.284       Subjective/Interval History:   Patient is doing well.  Alert and chatty this morning.  States she has always had problems with sleep-she has had insomnia since childhood. At home, patient takes clonazepam  for anxiety and Ambien for sleep. I told the patient I did not feel comfortable with her continuing with both these medications.  Would advise continuing clonazepam  and discontinuing Ambien. Patient states  that melatonin has not been effective for her. She also says her blood pressure has been difficult to control.   Physical Exam: BP (!) 192/75 (BP Location: Left Arm)   Pulse 66   Temp (!) 97.2 F (36.2 C) (Oral)   Resp 17   Ht 5' 5 (1.651 m)   Wt 60.5 kg   SpO2 96%   BMI 22.20 kg/m    Physical Exam   General: Awake, alert, oriented x 3 Eyes: Pupils equal, reactive, exophthalmos Oral cavity: moist mucous membranes  Head: Atraumatic, normocephalic  Neck: supple  Chest: Left basal crackles CVS: S1,S2 RRR. No murmurs  Abd: No distention, soft, non-tender. No masses palpable  Extr: No edema   MSK: No joint deformities or swelling  Neurological: Grossly intact.    Data Reviewed:    Latest Ref Rng & Units 07/26/2024    3:57 AM 07/25/2024    3:57 AM 07/24/2024   11:28 AM  CBC  WBC 4.0 - 10.5 K/uL 10.3  12.6  15.2   Hemoglobin 12.0 - 15.0 g/dL 87.6  87.4  88.1   Hematocrit 36.0 - 46.0 % 35.9  37.0  35.6   Platelets 150 - 400 K/uL 194  233  220       Latest Ref Rng & Units 07/26/2024    3:57 AM 07/25/2024    3:57 AM 07/24/2024   11:28 AM  BMP  Glucose 70 - 99 mg/dL 855  858  843   BUN 8 - 23 mg/dL 24  36  48   Creatinine 0.44 - 1.00 mg/dL 9.16  8.90  8.62   Sodium 135 - 145 mmol/L 136  146  145   Potassium 3.5 - 5.1 mmol/L 3.3  3.8  3.3   Chloride 98 - 111 mmol/L 103  108  110   CO2 22 - 32 mmol/L 17  24  29    Calcium 8.9 - 10.3 mg/dL 8.1  8.6  8.5      Family Communication: n/a  Disposition: Status is: Inpatient Remains inpatient appropriate because: Requires IV antibiotics for pneumonia.       Author: MDALA-GAUSI, Crystal Scarberry [name], MD 07/26/2024 11:40 AM  For on call review [email or web address].

## 2024-07-26 NOTE — TOC Progression Note (Signed)
 Transition of Care [facility]) - Progression Note    Patient Details  Name: Monica Faulkner MRN: [id number] Date of Birth: 02-26-1941  [facility] CM/SW Contact  Sherline Clack, [place] Phone Number: 07/26/2024, 3:41 PM  Clinical Narrative:     CSW spoke with patient at bedside and reviewed list of facilities along with their Medicare ratings. Patient identified [facility] as her facility of choice for SNF and requested CSW  submit insurance auth. CSW submitted insurance auth for facility: [facility] ID is [id number]. CSW will continue to follow.   Expected Discharge Plan: Skilled Nursing Facility Barriers to Discharge: Continued Medical Work up               Expected Discharge Plan and Services       Living arrangements for the past 2 months: Single Family Home                                       Social Drivers of Health (SDOH) Interventions SDOH Screenings   Food Insecurity: Patient Unable To Answer (07/23/2024)  Housing: Patient Unable To Answer (07/23/2024)  Transportation Needs: Patient Unable To Answer (07/23/2024)  Utilities: Patient Unable To Answer (07/23/2024)  Social Connections: Patient Unable To Answer (07/23/2024)  Tobacco Use: Low Risk  (07/22/2024)    Readmission Risk Interventions     No data to display

## 2024-07-26 NOTE — Progress Notes (Signed)
 Physical Therapy Treatment Patient Details Name: Monica Faulkner MRN: [id number] DOB: July 22, 1941 Today's Date: 07/26/2024   History of Present Illness Pt is an 83 y/o F who presented to [facility] 07/22/24  for evaluation after wellness check. Pt was found down at her house with poor living conditions noted by EMS - trash, rodents, old food. Pt hypotensive and tachycardic.  PMHx: HTN, DM, anxiety, basal cell carcinoma, GERD, anxiety, depression    PT Comments  Pt received in bed and relays that she would rather nap than work with PT but agreeable when encouraged. Pt shows significant improvement with mobility from last session. Pt able to come to long sitting in bed without assist from [facility] elevated, needed cues to problem solve pivoting to EOB. Began transfer training with use of stedy for increased pt confidence and pt did very well with this needing only min A. Progressed to use of RW and gait training. Pt was able to ambulate 8' 2x with RW and min A +2. Patient will benefit from continued inpatient follow up therapy, <3 hours/day. PT will continue to follow.     If plan is discharge home, recommend the following: A lot of help with bathing/dressing/bathroom;Assistance with cooking/housework;Direct supervision/assist for financial management;Direct supervision/assist for medications management;Assist for transportation;Help with stairs or ramp for entrance;Supervision due to cognitive status;A lot of help with walking and/or transfers   Can travel by private vehicle     No  Equipment Recommendations  None recommended by PT    Recommendations for Other Services       Precautions / Restrictions Precautions Precautions: Fall Recall of Precautions/Restrictions: Impaired Precaution/Restrictions Comments: delirium prevention precautions Restrictions Weight Bearing Restrictions Per Provider Order: No     Mobility  Bed Mobility Overal bed mobility: Needs Assistance Bed Mobility: Supine to Sit,  Sit to Supine     Supine to sit: HOB elevated, Min assist Sit to supine: Min assist   General bed mobility comments: pt able to come up into long sitting from [facility] elevated. Needed cues to pivot to EOB to be able to get up. Min A to LE's for return to supine    Transfers Overall transfer level: Needs assistance Equipment used: Ambulation equipment used, Rolling walker (2 wheels) Transfers: Sit to/from Stand Sit to Stand: +2 safety/equipment, Min assist           General transfer comment: had pt begin with use of stedy since she was somewhat resistant to getting up. Performed sit>stand 3x with min A. Pt did well with this so switched to RW and pt was able to stand with min A +2 for safety Transfer via Lift Equipment: Stedy  Ambulation/Gait Ambulation/Gait assistance: Min assist, +2 safety/equipment Gait Distance (Feet): 8 Feet (2x) Assistive device: Rolling walker (2 wheels) Gait Pattern/deviations: Step-through pattern, Trunk flexed Gait velocity: decreased Gait velocity interpretation: <1.31 ft/sec, indicative of household ambulator Pre-gait activities: marching in place General Gait Details: pt ambulated across room to chair and took seated rest break and then ambulated back to bed   Stairs             Wheelchair Mobility     Tilt Bed    Modified Rankin (Stroke Patients Only)       Balance Overall balance assessment: Needs assistance Sitting-balance support: Single extremity supported, Feet supported Sitting balance-Leahy Scale: Fair Sitting balance - Comments: sitting EOB, CGA for safety Postural control: Posterior lean Standing balance support: Bilateral upper extremity supported, During functional activity Standing balance-Leahy Scale: Poor Standing  balance comment: min A and UE support needed                            Communication Communication Communication: Impaired Factors Affecting Communication: Difficulty expressing self   Cognition Arousal: Alert Behavior During Therapy: Anxious   PT - Cognitive impairments: Initiation, Problem solving, Safety/Judgement, Memory                       PT - Cognition Comments: needs positive reinforcement and encouragement Following commands: Impaired Following commands impaired: Follows one step commands with increased time    Cueing Cueing Techniques: Verbal cues, Gestural cues  Exercises      General Comments General comments (skin integrity, edema, etc.): VSS on RA      Pertinent Vitals/Pain Pain Assessment Pain Assessment: Faces Faces Pain Scale: Hurts even more Pain Location: hips and back Pain Descriptors / Indicators: Discomfort, Sore Pain Intervention(s): Limited activity within patient's tolerance, Monitored during session    Home Living                          Prior Function            PT Goals (current goals can now be found in the care plan section) Acute Rehab PT Goals Patient Stated Goal: rest PT Goal Formulation: Patient unable to participate in goal setting Time For Goal Achievement: 08/06/24 Potential to Achieve Goals: Fair Progress towards PT goals: Progressing toward goals    Frequency    Min 2X/week      PT Plan      Co-evaluation              AM-PAC PT 6 Clicks Mobility   Outcome Measure  Help needed turning from your back to your side while in a flat bed without using bedrails?: A Little Help needed moving from lying on your back to sitting on the side of a flat bed without using bedrails?: A Little Help needed moving to and from a bed to a chair (including a wheelchair)?: A Lot Help needed standing up from a chair using your arms (e.g., wheelchair or bedside chair)?: A Lot Help needed to walk in hospital room?: A Lot Help needed climbing 3-5 steps with a railing? : Total 6 Click Score: 13    End of Session Equipment Utilized During Treatment: Gait belt Activity Tolerance: Patient  tolerated treatment well Patient left: in bed;with call bell/phone within reach;with bed alarm set Nurse Communication: Mobility status PT Visit Diagnosis: Other abnormalities of gait and mobility (R26.89)     Time: 8898-8875 PT Time Calculation (min) (ACUTE ONLY): 23 min  Charges:    $Gait Training: 8-22 mins $Therapeutic Activity: 8-22 mins PT General Charges $$ ACUTE PT VISIT: 1 Visit                     Richerd Lipoma, PT  Acute Rehab Services Secure chat preferred Office [phone]    Richerd CROME Gilbert Narain 07/26/2024, 2:05 PM

## 2024-07-26 NOTE — Plan of Care (Signed)
  Problem: Coping: Goal: Ability to adjust to condition or change in health will improve Outcome: Progressing   Problem: Fluid Volume: Goal: Ability to maintain a balanced intake and output will improve Outcome: Progressing   Problem: Health Behavior/Discharge Planning: Goal: Ability to identify and utilize available resources and services will improve Outcome: Progressing   Problem: Nutritional: Goal: Maintenance of adequate nutrition will improve Outcome: Progressing   Problem: Skin Integrity: Goal: Risk for impaired skin integrity will decrease Outcome: Progressing   Problem: Nutrition: Goal: Adequate nutrition will be maintained Outcome: Progressing

## 2024-07-26 NOTE — Discharge Summary (Signed)
 Physician Discharge Summary   Patient: Monica Faulkner MRN: [id number] DOB: 1941/05/15  Admit date:     07/22/2024  Discharge date: {dischdate:26783}  Discharge Physician: ***   PCP: Verdia Lombard, MD   Recommendations at discharge:  {Tip this will not be part of the note when signed- Example include specific recommendations for outpatient follow-up, pending tests to follow-up on. (Optional):26781}  ***  Discharge Diagnoses: Principal Problem:   Acute metabolic encephalopathy Active Problems:   Controlled type 2 diabetes mellitus without complication, without long-term current use of insulin  (HCC)   Essential hypertension   GERD (gastroesophageal reflux disease)   Dehydration   Leucocytosis   Lactic acid acidosis   AKI (acute kidney injury)  Resolved Problems:   * No resolved hospital problems. [facility] Course: Monica Faulkner is an 83 y.o. female with medical history significant of basal cell carcinoma, type 2 diabetes, GERD, essential hypertension, anxiety disorder, depression, who was brought in from home with increasing weakness for 4 days.  Patient lives alone and usually independent. EMS dispatch found the patient hypotensive and tachycardic in bed. On arrival they administered 800 mL of normal saline and blood pressure started improving.    Patient was noted to be markedly dehydrated, with low-grade fever, AKI, hyperglycemia, acute encephalopathy.   Of note, patient's brother, who lives in [place] , states his sister has not been coping well since her roommate passed away about 6 years ago. Family wanted patient to move somewhere she could get more help, but she refused.  Brother reports that the patient's house was found in a deplorable state and is currently uninhabitable.  Assessment and Plan:  Acute metabolic encephalopathy Patient lives alone at home at baseline. Presented with some confusion. Likely due to metabolic derangements and infection. Mental  status worsened on 12/7, likely due to hypotension in the setting of hypovolemia, home clonidine  dose.   Encephalopathy has resolved.   Sepsis Present on admission, evidenced by lactic acidosis in the setting of infection. Most likely due to pneumonia. Lactic acidosis of 3.9 on presentation, improved with hydration Presented with leukocytosis of greater than 20. She denies urinary symptoms. UA somewhat suggestive of infection. Chest x-ray done on admission showed no acute changes. Repeat CXR seems to show LLL infiltrate.  CT abdomen pelvis showed cholelithiasis without cholecystitis, moderate amount of stool in the rectum suggesting fecal impaction diverticulosis but no acute diverticulitis. Blood cultures grew Staph hominis (CoNS) - contaminant.  Patient was treated for pneumonia. Sepsis has resolved.   Community-acquired pneumonia Patient has findings on lung exam and chest x-ray suggestive of left lower lobe pneumonia. Treated with ceftriaxone  and azithromycin  for pneumonia, and transitioning to cefdinir at discharge.   Positive blood cultures Blood cultures drawn on admission on 12/5 grew Staph hominis in 1/2 sets. Likely contaminant. Vancomycin  was discontinued.   Weakness Likely due to dehydration, poor p.o. intake at home prior to admission, infection. Family says she is not coping well, and is no longer able to care for herself in her home.  Patient seen by PT/OT and SNF recommended. Patient is discharging to SNF.   Dehydration AKI Patient presented with creatinine of 2.92 from baseline of <1. Likely prerenal due to hypovolemia 2/2 poor p.o. intake Creatinine trended down with IV fluid. Creatinine was ***on the day of discharge  Hypokalemia Hypomagnesemia Electrolytes were repleted as needed   T2DM On Januvia at home. Patient was kept on a diabetic diet and sliding scale insulin  while admitted. Home Januvia resumed at  discharge.   HTN Resumed home atenolol ,  clonidine  Held home telmisartan 2/2 AKI. Will resume at discharge.    Anxiety On Klonopin  and zolpidem at home. PDMP reviewed. Resumed home Klonopin . Will not resume zolpidem.    Mild exophthalmos TSH: 2.284    {Tip this will not be part of the note when signed Body mass index is 22.2 kg/m. , ,  (Optional):26781}  {(NOTE) Pain control PDMP Statment (Optional):26782} Consultants: n/a Procedures performed: n/a  Disposition: Skilled nursing facility Diet recommendation:  Regular diet DISCHARGE MEDICATION: Allergies as of 07/26/2024       Reactions   Avapro  [irbesartan ] Other (See Comments)   Difficulty breathing, feeling like she was going to pass out   Calcium Channel Blockers    ineffective   Decongestant [pseudoephedrine] Other (See Comments)   Heart races , blood pressure goes up   Norvasc [amlodipine Besylate] Itching, Other (See Comments), Hypertension   Turned skin red   Sulfa Antibiotics Other (See Comments)   Made kidney infection worse   Zofran  [ondansetron ] Diarrhea   Only ODT tablets cause this problem.      Med Rec must be completed prior to using this [facility]***       Discharge Exam: Filed Weights   07/22/24 2353  Weight: 60.5 kg   Physical Exam on Day of Discharge   General: Alert, cheerful, oriented X3  Oral cavity: moist mucous membranes  Neck: supple  Chest: clear to auscultation. No crackles, no wheezes  CVS: S1,S2 RRR. No murmurs  Abd: No distention, soft, non-tender. No masses palpable  Extr: No edema    Condition at discharge: {DC Condition:26389}  The results of significant diagnostics from this hospitalization (including imaging, microbiology, ancillary and laboratory) are listed below for reference.   Imaging Studies: CT HEAD WO CONTRAST ( ) Result Date: 07/24/2024 EXAM: CT HEAD WITHOUT CONTRAST 07/24/2024 11:43:14 AM TECHNIQUE: CT of the head was performed without the administration of intravenous contrast. Automated  exposure control, iterative reconstruction, and/or weight based adjustment of the mA/kV was utilized to reduce the radiation dose to as low as reasonably achievable. COMPARISON: 11/15/2023 CLINICAL HISTORY: Mental status change, unknown cause FINDINGS: BRAIN AND VENTRICLES: No acute hemorrhage. No evidence of acute infarct. Patchy and confluent decreased attenuation throughout the deep and periventricular white matter of bilateral cerebral hemispheres, compatible with chronic microvascular ischemic disease. Cerebral ventricle sizes concordant with cerebral volume loss. No hydrocephalus. No extra-axial collection. No mass effect or midline shift. ORBITS: Bilateral lens replacement. SINUSES: Left maxillary and sphenoid mucosal thickening. SOFT TISSUES AND SKULL: No acute soft tissue abnormality. No skull fracture. Atherosclerotic calcifications within the cavernous internal carotid arteries. IMPRESSION: 1. No acute intracranial abnormality. 2. Chronic microvascular ischemic disease in the deep and periventricular white matter of bilateral cerebral hemispheres. 3. Left maxillary and sphenoid mucosal thickening. Electronically signed by: Evalene Coho MD 07/24/2024 12:05 PM EST RP Workstation: [id number]   DG CHEST PORT 1 VIEW Result Date: 07/23/2024 EXAM: 1 VIEW(S) XRAY OF THE CHEST 07/23/2024 03:11:00 PM COMPARISON: 07/22/2024 CLINICAL HISTORY: Leukocytosis. FINDINGS: LUNGS AND PLEURA: No focal pulmonary opacity. No pleural effusion. No pneumothorax. HEART AND MEDIASTINUM: Aortic atherosclerosis. No acute abnormality of the cardiac and mediastinal silhouettes. BONES AND SOFT TISSUES: Severe thoracic scoliosis, convex rightward, stable. No acute osseous abnormality. IMPRESSION: 1. No acute cardiopulmonary process. Electronically signed by: Franky Crease MD 07/23/2024 06:27 PM EST RP Workstation: [id number]   CT ABDOMEN PELVIS WO CONTRAST Result Date: 07/22/2024 CLINICAL DATA:  Acute abdominal pain. EXAM: CT  ABDOMEN  AND PELVIS WITHOUT CONTRAST TECHNIQUE: Multidetector CT imaging of the abdomen and pelvis was performed following the standard protocol without IV contrast. RADIATION DOSE REDUCTION: This exam was performed according to the departmental dose-optimization program which includes automated exposure control, adjustment of the mA and/or kV according to patient size and/or use of iterative reconstruction technique. COMPARISON:  04/20/2021 FINDINGS: Lower chest: Eventration of both hemidiaphragms with overlying vascular crowding and atelectasis. No infiltrates or effusions. Aortic and coronary artery calcifications are again noted. Hepatobiliary: No hepatic lesions or intrahepatic biliary dilatation. Gallbladder is borderline distended and contains gallstones. The largest measures 2 cm. No findings suspicious for acute cholecystitis. No common bile duct dilatation. Pancreas: Stable pancreatic atrophy but no mass, inflammation or ductal dilatation. Spleen: Stable calcified granulomas.  No worrisome lesions. Adrenals/Urinary Tract: Stable bilateral adrenal gland nodules. These are unchanged since 2000 19 and considered benign. No renal or obstructing ureteral calculi. No bladder calculi. No worrisome renal or bladder lesions without contrast. Stomach/Bowel: Stomach, duodenum, small and colon are grossly normal. No acute inflammatory process, mass lesions or obstructive findings. Stable colonic diverticulosis but no findings for acute diverticulitis. Moderate amount stool in a distended rectum suggesting possible fecal impaction. Stool burden otherwise is unremarkable. Vascular/Lymphatic: Stable atherosclerotic calcifications involving the aorta and branch vessels but no aneurysm. No mesenteric or retroperitoneal mass or adenopathy. Reproductive: The uterus is unremarkable.  No adnexal mass. Other: No pelvic mass or adenopathy. No free pelvic fluid collections. No inguinal mass or adenopathy. No abdominal wall hernia  or subcutaneous lesions. Musculoskeletal: Stable severe scoliosis and associated degenerative lumbar spondylosis with multilevel disc disease and facet disease. No acute bony findings or worrisome bone lesions. IMPRESSION: 1. No acute abdominal/pelvic findings, mass lesions or adenopathy. 2. Cholelithiasis. 3. Stable bilateral adrenal gland nodules, considered benign. 4. Stable colonic diverticulosis but no findings for acute diverticulitis. 5. Moderate amount of stool in a distended rectum suggesting possible fecal impaction. 6. Stable severe scoliosis and associated degenerative lumbar spondylosis with multilevel disc disease and facet disease. 7. Aortic atherosclerosis. Aortic Atherosclerosis (ICD10-I70.0). Electronically Signed   By: MYRTIS Stammer M.D.   On: 07/22/2024 19:24   DG Chest Portable 1 View Result Date: 07/22/2024 CLINICAL DATA:  FTT EXAM: PORTABLE CHEST - 1 VIEW COMPARISON:  November 14, 2023 FINDINGS: Lower lung volumes. No focal airspace consolidation, pleural effusion, or pneumothorax. No cardiomegaly. Tortuous aorta with aortic atherosclerosis. No acute fracture or destructive lesions. Multilevel thoracic osteophytosis. Similar moderate dextrocurvature of the thoracic spine. IMPRESSION: Low lung volumes.  Otherwise, no acute cardiopulmonary abnormality. Electronically Signed   By: Rogelia Myers M.D.   On: 07/22/2024 17:00    Microbiology: Results for orders placed or performed during the hospital encounter of 07/22/24  Culture, blood (single)     Status: Abnormal   Collection Time: 07/22/24  4:42 PM   Specimen: BLOOD  Result Value Ref Range Status   Specimen Description BLOOD SITE NOT SPECIFIED  Final   Special Requests   Final    BOTTLES DRAWN AEROBIC AND ANAEROBIC Blood Culture results may not be optimal due to an inadequate volume of blood received in culture bottles   Culture  Setup Time   Final    GRAM POSITIVE COCCI AEROBIC BOTTLE ONLY CRITICAL RESULT CALLED TO, READ BACK  BY AND VERIFIED WITH: PHARMD ERIN W. [id number] AT 1554, ADC    Culture (A)  Final    STAPHYLOCOCCUS HOMINIS THE SIGNIFICANCE OF ISOLATING THIS ORGANISM FROM A SINGLE SET OF BLOOD CULTURES WHEN MULTIPLE  SETS ARE DRAWN IS UNCERTAIN. PLEASE NOTIFY THE MICROBIOLOGY DEPARTMENT WITHIN ONE WEEK IF SPECIATION AND SENSITIVITIES ARE REQUIRED. Performed at [facility], [address], [place], [place] [address]    Report Status 07/25/2024 FINAL  Final  Blood Culture ID Panel (Reflexed)     Status: Abnormal   Collection Time: 07/22/24  4:42 PM  Result Value Ref Range Status   Enterococcus faecalis NOT DETECTED NOT DETECTED Final   Enterococcus Faecium NOT DETECTED NOT DETECTED Final   Listeria monocytogenes NOT DETECTED NOT DETECTED Final   Staphylococcus species DETECTED (A) NOT DETECTED Final    Comment: CRITICAL RESULT CALLED TO, READ BACK BY AND VERIFIED WITH: PHARMD ERIN W. [id number] AT 1554, ADC    Staphylococcus aureus (BCID) NOT DETECTED NOT DETECTED Final   Staphylococcus epidermidis NOT DETECTED NOT DETECTED Final   Staphylococcus lugdunensis NOT DETECTED NOT DETECTED Final   Streptococcus species NOT DETECTED NOT DETECTED Final   Streptococcus agalactiae NOT DETECTED NOT DETECTED Final   Streptococcus pneumoniae NOT DETECTED NOT DETECTED Final   Streptococcus pyogenes NOT DETECTED NOT DETECTED Final   A.calcoaceticus-baumannii NOT DETECTED NOT DETECTED Final   Bacteroides fragilis NOT DETECTED NOT DETECTED Final   Enterobacterales NOT DETECTED NOT DETECTED Final   Enterobacter cloacae complex NOT DETECTED NOT DETECTED Final   Escherichia coli NOT DETECTED NOT DETECTED Final   Klebsiella aerogenes NOT DETECTED NOT DETECTED Final   Klebsiella oxytoca NOT DETECTED NOT DETECTED Final   Klebsiella pneumoniae NOT DETECTED NOT DETECTED Final   Proteus species NOT DETECTED NOT DETECTED Final   Salmonella species NOT DETECTED NOT DETECTED Final   Serratia marcescens NOT DETECTED NOT  DETECTED Final   Haemophilus influenzae NOT DETECTED NOT DETECTED Final   Neisseria meningitidis NOT DETECTED NOT DETECTED Final   Pseudomonas aeruginosa NOT DETECTED NOT DETECTED Final   Stenotrophomonas maltophilia NOT DETECTED NOT DETECTED Final   Candida albicans NOT DETECTED NOT DETECTED Final   Candida auris NOT DETECTED NOT DETECTED Final   Candida glabrata NOT DETECTED NOT DETECTED Final   Candida krusei NOT DETECTED NOT DETECTED Final   Candida parapsilosis NOT DETECTED NOT DETECTED Final   Candida tropicalis NOT DETECTED NOT DETECTED Final   Cryptococcus neoformans/gattii NOT DETECTED NOT DETECTED Final    Comment: Performed at [facility], [address], [place], [place] [address]  Culture, blood (Routine X 2) w Reflex to ID Panel     Status: None (Preliminary result)   Collection Time: 07/24/24  5:50 AM   Specimen: BLOOD RIGHT ARM  Result Value Ref Range Status   Specimen Description BLOOD RIGHT ARM  Final   Special Requests   Final    BOTTLES DRAWN AEROBIC ONLY Blood Culture results may not be optimal due to an inadequate volume of blood received in culture bottles   Culture   Final    NO GROWTH 2 DAYS Performed at [facility], [address], [place], [place] [address]    Report Status PENDING  Incomplete  Culture, blood (Routine X 2) w Reflex to ID Panel     Status: None (Preliminary result)   Collection Time: 07/24/24 11:28 AM   Specimen: BLOOD RIGHT ARM  Result Value Ref Range Status   Specimen Description BLOOD RIGHT ARM  Final   Special Requests   Final    BOTTLES DRAWN AEROBIC AND ANAEROBIC Blood Culture adequate volume   Culture   Final    NO GROWTH 2 DAYS Performed at [facility], [address] [place], [place] [address]    Report Status PENDING  Incomplete    Labs: CBC: Recent Labs  Lab 07/22/24 1600 07/22/24 1620 07/22/24 2216 07/23/24 0536 07/24/24 1128 07/25/24 0357 07/26/24 0357  WBC 24.6*  --  28.3* 26.4* 15.2*  12.6* 10.3  NEUTROABS 21.2*  --   --   --   --   --   --   HGB 17.7*   < > 17.3* 16.2* 11.8* 12.5 12.3  HCT 51.5*   < > 50.7* 47.3* 35.6* 37.0 35.9*  MCV 93.8  --  93.9 92.9 94.7 94.4 94.0  PLT 344  --  303 273 220 233 194   < > = values in this interval not displayed.   Basic Metabolic Panel: Recent Labs  Lab 07/22/24 1600 07/22/24 1620 07/22/24 2216 07/23/24 0536 07/24/24 1128 07/25/24 0357 07/26/24 0357  NA 140 136  --  141 145 146* 136  K 4.1 3.8  --  3.4* 3.3* 3.8 3.3*  CL 100  --   --  102 110 108 103  CO2 19*  --   --  23 29 24  17*  GLUCOSE 284*  --   --  190* 156* 141* 144*  BUN 62*  --   --  58* 48* 36* 24*  CREATININE 2.92*  --  2.35* 1.93* 1.37* 1.09* 0.83  CALCIUM 9.8  --   --  9.4 8.5* 8.6* 8.1*  MG 1.8  --   --   --  1.7 1.5* 1.8   Liver Function Tests: Recent Labs  Lab 07/22/24 1600 07/23/24 0536 07/24/24 1128  AST 43* 34 27  ALT 15 13 13   ALKPHOS 65 55 38  BILITOT 1.3* 1.0 0.9  PROT 6.5 5.8* 4.5*  ALBUMIN 3.3* 2.9* 2.3*   CBG: Recent Labs  Lab 07/25/24 1138 07/25/24 1622 07/25/24 2017 07/26/24 0759 07/26/24 1142  GLUCAP 184* 209* 141* 150* 138*    Discharge time spent: {LESS THAN/GREATER THAN:26388} 30 minutes.  Signed: *** [facility] ***

## 2024-07-27 LAB — BASIC METABOLIC PANEL WITH GFR
Anion gap: 8 (ref 5–15)
BUN: 16 mg/dL (ref 8–23)
CO2: 28 mmol/L (ref 22–32)
Calcium: 8.6 mg/dL — ABNORMAL LOW (ref 8.9–10.3)
Chloride: 101 mmol/L (ref 98–111)
Creatinine, Ser: 0.85 mg/dL (ref 0.44–1.00)
GFR, Estimated: >60 mL/min (ref >60)
Glucose, Bld: 133 mg/dL — ABNORMAL HIGH (ref 70–99)
Potassium: 3.4 mmol/L — ABNORMAL LOW (ref 3.5–5.1)
Sodium: 137 mmol/L (ref 135–145)

## 2024-07-27 LAB — CBC
HCT: 37.2 % (ref 36.0–46.0)
Hemoglobin: 12.6 g/dL (ref 12.0–15.0)
MCH: 32.1 pg (ref 26.0–34.0)
MCHC: 33.9 g/dL (ref 30.0–36.0)
MCV: 94.7 fL (ref 80.0–100.0)
Platelets: 185 K/uL (ref 150–400)
RBC: 3.93 MIL/uL (ref 3.87–5.11)
RDW: 11.9 % (ref 11.5–15.5)
WBC: 8.9 K/uL (ref 4.0–10.5)
nRBC: 0 % (ref 0.0–0.2)

## 2024-07-27 LAB — GLUCOSE, CAPILLARY
Glucose-Capillary: 129 mg/dL — ABNORMAL HIGH (ref 70–99)
Glucose-Capillary: 122 mg/dL — ABNORMAL HIGH (ref 70–99)
Glucose-Capillary: 184 mg/dL — ABNORMAL HIGH (ref 70–99)
Glucose-Capillary: 107 mg/dL — ABNORMAL HIGH (ref 70–99)

## 2024-07-27 LAB — LACTIC ACID, PLASMA: Lactic Acid, Venous: 1.7 mmol/L (ref 0.5–1.9)

## 2024-07-27 MED ORDER — IRBESARTAN 300 MG PO TABS
300.0000 mg | ORAL_TABLET | Freq: Every day | ORAL | Status: DC
  Administered 2024-07-27 – 2024-07-28 (×2): 300 mg via ORAL
  Filled 2024-07-27 (×2): qty 1

## 2024-07-27 MED ORDER — AMOXICILLIN-POT CLAVULANATE 875-125 MG PO TABS: 1.0000 | ORAL_TABLET | Freq: Two times a day (BID) | ORAL | 0 refills | Status: AC

## 2024-07-27 MED ORDER — PRAVASTATIN SODIUM 40 MG PO TABS
20.0000 mg | ORAL_TABLET | ORAL | Status: DC
  Administered 2024-07-27: 20 mg via ORAL
  Filled 2024-07-27: qty 1

## 2024-07-27 MED ORDER — FAMOTIDINE 20 MG PO TABS
20.0000 mg | ORAL_TABLET | Freq: Every day | ORAL | Status: DC
  Administered 2024-07-27 – 2024-07-28 (×2): 20 mg via ORAL
  Filled 2024-07-27 (×2): qty 1

## 2024-07-27 MED ORDER — VITAMIN D 25 MCG (1000 UNIT) PO TABS
2000.0000 [IU] | ORAL_TABLET | Freq: Every day | ORAL | Status: DC
  Administered 2024-07-27 – 2024-07-28 (×2): 2000 [IU] via ORAL
  Filled 2024-07-27 (×2): qty 2

## 2024-07-27 MED ORDER — HYDRALAZINE HCL 50 MG PO TABS
50.0000 mg | ORAL_TABLET | Freq: Once | ORAL | Status: AC
  Administered 2024-07-27: 50 mg via ORAL
  Filled 2024-07-27: qty 1

## 2024-07-27 MED ORDER — CLONAZEPAM 0.5 MG PO TABS: 0.5000 mg | ORAL_TABLET | Freq: Every day | ORAL | 0 refills | Status: AC

## 2024-07-27 MED ORDER — ESCITALOPRAM OXALATE 10 MG PO TABS
10.0000 mg | ORAL_TABLET | Freq: Every day | ORAL | Status: DC
  Administered 2024-07-27 – 2024-07-28 (×2): 10 mg via ORAL
  Filled 2024-07-27 (×2): qty 1

## 2024-07-27 MED ORDER — POTASSIUM CHLORIDE CRYS ER 20 MEQ PO TBCR
40.0000 meq | EXTENDED_RELEASE_TABLET | ORAL | Status: AC
  Administered 2024-07-27 (×2): 40 meq via ORAL
  Filled 2024-07-27 (×2): qty 2

## 2024-07-27 MED ORDER — AMOXICILLIN-POT CLAVULANATE 875-125 MG PO TABS
1.0000 | ORAL_TABLET | Freq: Two times a day (BID) | ORAL | Status: DC
  Administered 2024-07-27 – 2024-07-28 (×2): 1 via ORAL
  Filled 2024-07-27 (×2): qty 1

## 2024-07-27 MED ORDER — CLONIDINE HCL 0.1 MG PO TABS: 0.2000 mg | ORAL_TABLET | Freq: Two times a day (BID) | ORAL | Status: DC

## 2024-07-27 NOTE — Progress Notes (Signed)
 Occupational Therapy Treatment Patient Details Name: Monica Faulkner MRN: [id number] DOB: [birth year] Today's Date: 07/27/2024   History of present illness Pt is an 83 y/o F who presented to [facility] 07/22/24  for evaluation after wellness check. Pt was found down at her house with poor living conditions noted by EMS - trash, rodents, old food. Pt hypotensive and tachycardic.  PMHx: HTN, DM, anxiety, basal cell carcinoma, GERD, anxiety, depression   OT comments  Patient demonstrating good gains with OT treatment with bed mobility, transfers, and self care. Patient able to stand at sink with limited standing tolerance and able to perform self care tasks seated with supervision for UB and mod assist for LB.  Patient asked to return to be at end of session due to fatigue. Patient will benefit from continued inpatient follow up therapy, <3 hours/day.  Acute OT to continue to follow to address established goals to facilitate DC to next venue of care.        If plan is discharge home, recommend the following:  A lot of help with bathing/dressing/bathroom;Assistance with cooking/housework;Direct supervision/assist for medications management;Assist for transportation;Direct supervision/assist for financial management;Help with stairs or ramp for entrance;Supervision due to cognitive status;A little help with walking and/or transfers   Equipment Recommendations  Other (comment) (defer to next level of care)    Recommendations for Other Services      Precautions / Restrictions Precautions Precautions: Fall Recall of Precautions/Restrictions: Impaired Precaution/Restrictions Comments: delirium prevention precautions Restrictions Weight Bearing Restrictions Per Provider Order: No       Mobility Bed Mobility Overal bed mobility: Needs Assistance Bed Mobility: Supine to Sit, Sit to Supine     Supine to sit: HOB elevated, Min assist Sit to supine: Min assist   General bed mobility comments: min  assist to scoot to EOB and assistance with BLE to return to supine    Transfers Overall transfer level: Needs assistance Equipment used: Rolling walker (2 wheels) Transfers: Sit to/from Stand Sit to Stand: Min assist           General transfer comment: min assist to stand from EOB and chair with cues for hand placement.     Balance Overall balance assessment: Needs assistance Sitting-balance support: Single extremity supported, Feet supported Sitting balance-Leahy Scale: Fair Sitting balance - Comments: EOB   Standing balance support: Bilateral upper extremity supported, During functional activity Standing balance-Leahy Scale: Poor Standing balance comment: reliant on RW for support                           ADL either performed or assessed with clinical judgement   ADL Overall ADL's : Needs assistance/impaired Eating/Feeding: Set up   Grooming: Wash/dry hands;Wash/dry face;Oral care;Brushing hair;Minimal assistance;Sitting Grooming Details (indicate cue type and reason): min assist for hair Upper Body Bathing: Minimal assistance;Sitting   Lower Body Bathing: Moderate assistance;Sit to/from stand   Upper Body Dressing : Minimal assistance;Sitting   Lower Body Dressing: Maximal assistance;Sitting/lateral leans Lower Body Dressing Details (indicate cue type and reason): socks               General ADL Comments: demonstrating gains with self care and mobility    Extremity/Trunk Assessment              Vision       Perception     Praxis     Communication Communication Communication: Impaired Factors Affecting Communication: Difficulty expressing self   Cognition Arousal: Alert Behavior  During Therapy: Anxious Cognition: No apparent impairments             OT - Cognition Comments: able to answer orientation questions correctly, no cues for sequencing during self care                 Following commands: Impaired Following  commands impaired: Follows one step commands with increased time      Cueing   Cueing Techniques: Verbal cues, Gestural cues  Exercises      Shoulder Instructions       General Comments VSS on RA    Pertinent Vitals/ Pain       Pain Assessment Pain Assessment: Faces Faces Pain Scale: Hurts a little bit Pain Location: hips and back Pain Descriptors / Indicators: Discomfort, Sore Pain Intervention(s): Limited activity within patient's tolerance, Monitored during session, Repositioned  Home Living                                          Prior Functioning/Environment              Frequency  Min 2X/week        Progress Toward Goals  OT Goals(current goals can now be found in the care plan section)  Progress towards OT goals: Progressing toward goals  Acute Rehab OT Goals Patient Stated Goal: to go to rehab OT Goal Formulation: With patient Time For Goal Achievement: 08/06/24 Potential to Achieve Goals: Fair ADL Goals Pt Will Perform Grooming: with min assist;sitting Pt Will Perform Upper Body Bathing: with min assist;sitting Pt Will Perform Lower Body Bathing: with mod assist;sitting/lateral leans Pt Will Transfer to Toilet: with mod assist;with +2 assist;stand pivot transfer;bedside commode Additional ADL Goal #1: Pt will follow 1-step commands 75% of the time during ADLs and functional mobility.  Plan      Co-evaluation                 AM-PAC OT 6 Clicks Daily Activity     Outcome Measure   Help from another person eating meals?: A Little Help from another person taking care of personal grooming?: A Lot Help from another person toileting, which includes using toliet, bedpan, or urinal?: A Lot Help from another person bathing (including washing, rinsing, drying)?: A Lot Help from another person to put on and taking off regular upper body clothing?: A Lot Help from another person to put on and taking off regular lower body  clothing?: A Lot 6 Click Score: 13    End of Session Equipment Utilized During Treatment: Gait belt;Rolling walker (2 wheels)  OT Visit Diagnosis: Unsteadiness on feet (R26.81);Other abnormalities of gait and mobility (R26.89);Other symptoms and signs involving cognitive function   Activity Tolerance Patient tolerated treatment well   Patient Left in bed;with call bell/phone within reach;with bed alarm set   Nurse Communication Mobility status        Time: 9070-9045 OT Time Calculation (min): 25 min  Charges: OT General Charges $OT Visit: 1 Visit OT Treatments $Self Care/Home Management : 23-37 mins  Dick Laine, OTA Acute Rehabilitation Services  Office [phone]   Jeb LITTIE Laine 07/27/2024, 12:06 PM

## 2024-07-27 NOTE — Progress Notes (Signed)
 PROGRESS NOTE    Monica Faulkner  FMW:[id number] DOB: 06/06/1941 DOA: 07/22/2024 PCP: Monica Lombard, MD   Brief Narrative:  Monica Faulkner is an 83 y.o. female with medical history significant of basal cell carcinoma, type 2 diabetes, GERD, essential hypertension, anxiety disorder, depression, who was brought in from home with increasing weakness for 4 days.  Patient lives alone and usually independent. EMS dispatch found the patient hypotensive and tachycardic in bed. On arrival they administered 800 mL of normal saline and blood pressure started improving.    Patient was noted to be markedly dehydrated, with low-grade fever, AKI, hyperglycemia, acute encephalopathy.   Of note, patient's brother, who lives in [place] , states his sister has not been coping well since her roommate passed away about 6 years ago. Family wanted patient to move somewhere she could get more help, but she refused.  Brother reports that the patient's house was found in a deplorable state and is currently uninhabitable.  Details of hospitalization below.  Assessment & Plan:   Principal Problem:   Acute metabolic encephalopathy Active Problems:   Controlled type 2 diabetes mellitus without complication, without long-term current use of insulin  (HCC)   Essential hypertension   GERD (gastroesophageal reflux disease)   Dehydration   Leucocytosis   Lactic acid acidosis   AKI (acute kidney injury)  Acute metabolic encephalopathy Patient lives alone at home at baseline. Presented with some confusion. Likely due to metabolic derangements and infection. Mental status worsened on 12/7, likely due to hypotension in the setting of hypovolemia, home clonidine  dose.   Encephalopathy has resolved. - Will continue home clonazepam , but will not resume Ambien.  - Will monitor closely. - Continue to treat infection.     Sepsis secondary to community-acquired pneumonia, POA: evidenced by lactic acidosis in the  setting of infection. Lactic acidosis of 3.9 on presentation, improved with hydration Presented with leukocytosis of greater than 20. She denied urinary symptoms. UA somewhat suggestive of infection. Chest x-ray done on admission showed no acute changes. Repeat CXR seems to show LLL infiltrate.  CT abdomen pelvis showed cholelithiasis without cholecystitis, moderate amount of stool in the rectum suggesting fecal impaction diverticulosis but no acute diverticulitis. Blood cultures grew Staph hominis (CoNS) - contaminant.  Patient was treated for pneumonia with Rocephin  and Zithromax  and has received 3 to 4 days of antibiotics.  Sepsis has resolved.  Patient is now being discharged on Augmentin  for few more days.  She is comfortable on room air. Sepsis has resolved.   Positive blood cultures Blood cultures drawn on admission on 12/5 grew Staph hominis in 1/2 sets. Likely contaminant. Vancomycin  was discontinued.   Generalized weakness/debility Likely due to dehydration, poor p.o. intake at home prior to admission, infection. Family says she is not coping well, and is no longer able to care for herself in her home.  Patient seen by PT/OT and SNF recommended.  Pending placement at this point in time.   AKI secondary to dehydration Patient presented with creatinine of 2.92 from baseline of <1. Likely prerenal due to hypovolemia 2/2 poor p.o. intake Creatinine trended down with IV fluid. Creatinine down to normal today.   Hypokalemia Hypomagnesemia Slightly low potassium, replenished today.   T2DM On Januvia at home.  Currently on SSI, blood sugar controlled.   HTN Resumed home atenolol , clonidine  and telmisartan now that blood pressure is rising.   Anxiety On Klonopin  and zolpidem at home. PDMP reviewed. Resumed home Klonopin . Will not resume zolpidem.  Mild exophthalmos TSH: 2.284  DVT prophylaxis: heparin  injection 5,000 Units Start: 07/22/24 2200   Code Status: Full  Code  Family Communication:  None present at bedside.  Plan of care discussed with patient in length and he/she verbalized understanding and agreed with it.  Status is: Inpatient Remains inpatient appropriate because: Medically stable, pending placement to SNF.   Estimated body mass index is 22.2 kg/m as calculated from the following:   Height as of this encounter: 5' 5 (1.651 m).   Weight as of this encounter: 60.5 kg.    Nutritional Assessment: Body mass index is 22.2 kg/m.[name] Seen by dietician.  I agree with the assessment and plan as outlined below: Nutrition Status:        . Skin Assessment: I have examined the patient's skin and I agree with the wound assessment as performed by the wound care RN as outlined below:    Consultants:  None  Procedures:  None  Antimicrobials:  Anti-infectives (From admission, onward)    Start     Dose/Rate Route Frequency Ordered Stop   07/27/24 0000  amoxicillin -clavulanate (AUGMENTIN ) 875-125 MG tablet        1 tablet Oral 2 times daily 07/27/24 0944 07/31/24 2359   07/26/24 1200  azithromycin  (ZITHROMAX ) tablet 500 mg        500 mg Oral  Once 07/26/24 0840 07/26/24 1155   07/24/24 1800  vancomycin  (VANCOREADY) IVPB 500 mg/100 mL  Status:  Discontinued        500 mg 100 mL/hr over 60 Minutes Intravenous Every 24 hours 07/23/24 1540 07/24/24 1341   07/24/24 1615  cefTRIAXone  (ROCEPHIN ) 1 g in sodium chloride  0.9 % 100 mL IVPB        1 g 200 mL/hr over 30 Minutes Intravenous Every 24 hours 07/24/24 1516 07/29/24 1614   07/24/24 1615  azithromycin  (ZITHROMAX ) 500 mg in sodium chloride  0.9 % 250 mL IVPB  Status:  Discontinued        500 mg 250 mL/hr over 60 Minutes Intravenous Every 24 hours 07/24/24 1516 07/26/24 0840   07/24/24 1400  vancomycin  (VANCOREADY) IVPB 500 mg/100 mL  Status:  Discontinued        500 mg 100 mL/hr over 60 Minutes Intravenous Every 24 hours 07/24/24 1341 07/24/24 1516   07/22/24 1645  ceFEPIme  (MAXIPIME ) 2  g in sodium chloride  0.9 % 100 mL IVPB        2 g 200 mL/hr over 30 Minutes Intravenous  Once 07/22/24 1642 07/22/24 1727   07/22/24 1645  metroNIDAZOLE  (FLAGYL ) IVPB 500 mg        500 mg 100 mL/hr over 60 Minutes Intravenous  Once 07/22/24 1642 07/22/24 1846   07/22/24 1645  vancomycin  (VANCOCIN ) IVPB 1000 mg/200 mL premix        1,000 mg 200 mL/hr over 60 Minutes Intravenous  Once 07/22/24 1642 07/22/24 2002         Subjective: Patient seen and examined, fully alert and oriented.  She has no complaints at all.  Objective: Vitals:   07/27/24 0021 07/27/24 0452 07/27/24 0826 07/27/24 1008  BP: (!) 152/64 (!) 168/73 (!) 196/77 128/63  Pulse: (!) 56 68  68  Resp: 20 20 19 18   Temp: 98 F (36.7 C) (!) 97.2 F (36.2 C) 97.7 F (36.5 C) 98 F (36.7 C)  TempSrc:  Oral Oral Oral  SpO2: 97% 98% 99% 99%  Weight:      Height:  Intake/Output Summary (Last 24 hours) at 07/27/2024 1353 Last data filed at 07/27/2024 9076 Gross per 24 hour  Intake 250 ml  Output --  Net 250 ml   Filed Weights   07/22/24 2353  Weight: 60.5 kg    Examination:  General exam: Appears calm and comfortable  Respiratory system: Clear to auscultation. Respiratory effort normal. Cardiovascular system: S1 & S2 heard, RRR. No JVD, murmurs, rubs, gallops or clicks. No pedal edema. Gastrointestinal system: Abdomen is nondistended, soft and nontender. No organomegaly or masses felt. Normal bowel sounds heard. Central nervous system: Alert and oriented. No focal neurological deficits. Extremities: Symmetric 5 x 5 power. Skin: No rashes, lesions or ulcers Psychiatry: Judgement and insight appear normal. Mood & affect appropriate.    Data Reviewed: I have personally reviewed following labs and imaging studies  CBC: Recent Labs  Lab 07/22/24 1600 07/22/24 1620 07/23/24 0536 07/24/24 1128 07/25/24 0357 07/26/24 0357 07/27/24 0345  WBC 24.6*   < > 26.4* 15.2* 12.6* 10.3 8.9  NEUTROABS 21.2*   --   --   --   --   --   --   HGB 17.7*   < > 16.2* 11.8* 12.5 12.3 12.6  HCT 51.5*   < > 47.3* 35.6* 37.0 35.9* 37.2  MCV 93.8   < > 92.9 94.7 94.4 94.0 94.7  PLT 344   < > 273 220 233 194 185   < > = values in this interval not displayed.   Basic Metabolic Panel: Recent Labs  Lab 07/22/24 1600 07/22/24 1620 07/23/24 0536 07/24/24 1128 07/25/24 0357 07/26/24 0357 07/27/24 0345  NA 140   < > 141 145 146* 136 137  K 4.1   < > 3.4* 3.3* 3.8 3.3* 3.4*  CL 100  --  102 110 108 103 101  CO2 19*  --  23 29 24  17* 28  GLUCOSE 284*  --  190* 156* 141* 144* 133*  BUN 62*  --  58* 48* 36* 24* 16  CREATININE 2.92*   < > 1.93* 1.37* 1.09* 0.83 0.85  CALCIUM 9.8  --  9.4 8.5* 8.6* 8.1* 8.6*  MG 1.8  --   --  1.7 1.5* 1.8  --    < > = values in this interval not displayed.   GFR: Estimated Creatinine Clearance: 45.9 mL/min (by C-G formula based on SCr of 0.85 mg/dL). Liver Function Tests: Recent Labs  Lab 07/22/24 1600 07/23/24 0536 07/24/24 1128  AST 43* 34 27  ALT 15 13 13   ALKPHOS 65 55 38  BILITOT 1.3* 1.0 0.9  PROT 6.5 5.8* 4.5*  ALBUMIN 3.3* 2.9* 2.3*   No results for input(s): LIPASE, AMYLASE in the last 168 hours. No results for input(s): AMMONIA in the last 168 hours. Coagulation Profile: No results for input(s): INR, PROTIME in the last 168 hours. Cardiac Enzymes: Recent Labs  Lab 07/22/24 1600  CKTOTAL 205   BNP (last 3 results) No results for input(s): PROBNP in the last 8760 hours. HbA1C: No results for input(s): HGBA1C in the last 72 hours. CBG: Recent Labs  Lab 07/26/24 1558 07/26/24 2019 07/27/24 0511 07/27/24 0749 07/27/24 1230  GLUCAP 138* 157* 129* 122* 184*   Lipid Profile: No results for input(s): CHOL, HDL, LDLCALC, TRIG, CHOLHDL, LDLDIRECT in the last 72 hours. Thyroid  Function Tests: No results for input(s): TSH, T4TOTAL, FREET4, T3FREE, THYROIDAB in the last 72 hours. Anemia Panel: No results for  input(s): VITAMINB12, FOLATE, FERRITIN, TIBC, IRON, RETICCTPCT in  the last 72 hours. Sepsis Labs: Recent Labs  Lab 07/22/24 1631 07/22/24 1850 07/22/24 2216 07/27/24 0925  LATICACIDVEN 3.9* 1.7 2.7* 1.7    Recent Results (from the past 240 hours)  Culture, blood (single)     Status: Abnormal   Collection Time: 07/22/24  4:42 PM   Specimen: BLOOD  Result Value Ref Range Status   Specimen Description BLOOD SITE NOT SPECIFIED  Final   Special Requests   Final    BOTTLES DRAWN AEROBIC AND ANAEROBIC Blood Culture results may not be optimal due to an inadequate volume of blood received in culture bottles   Culture  Setup Time   Final    GRAM POSITIVE COCCI AEROBIC BOTTLE ONLY CRITICAL RESULT CALLED TO, READ BACK BY AND VERIFIED WITH: PHARMD ERIN W. [id number] AT 1554, ADC    Culture (A)  Final    STAPHYLOCOCCUS HOMINIS THE SIGNIFICANCE OF ISOLATING THIS ORGANISM FROM A SINGLE SET OF BLOOD CULTURES WHEN MULTIPLE SETS ARE DRAWN IS UNCERTAIN. PLEASE NOTIFY THE MICROBIOLOGY DEPARTMENT WITHIN ONE WEEK IF SPECIATION AND SENSITIVITIES ARE REQUIRED. Performed at [facility], [address], [place], [place] [address]    Report Status 07/25/2024 FINAL  Final  Blood Culture ID Panel (Reflexed)     Status: Abnormal   Collection Time: 07/22/24  4:42 PM  Result Value Ref Range Status   Enterococcus faecalis NOT DETECTED NOT DETECTED Final   Enterococcus Faecium NOT DETECTED NOT DETECTED Final   Listeria monocytogenes NOT DETECTED NOT DETECTED Final   Staphylococcus species DETECTED (A) NOT DETECTED Final    Comment: CRITICAL RESULT CALLED TO, READ BACK BY AND VERIFIED WITH: PHARMD ERIN W. [id number] AT 1554, ADC    Staphylococcus aureus (BCID) NOT DETECTED NOT DETECTED Final   Staphylococcus epidermidis NOT DETECTED NOT DETECTED Final   Staphylococcus lugdunensis NOT DETECTED NOT DETECTED Final   Streptococcus species NOT DETECTED NOT DETECTED Final   Streptococcus agalactiae NOT  DETECTED NOT DETECTED Final   Streptococcus pneumoniae NOT DETECTED NOT DETECTED Final   Streptococcus pyogenes NOT DETECTED NOT DETECTED Final   A.calcoaceticus-baumannii NOT DETECTED NOT DETECTED Final   Bacteroides fragilis NOT DETECTED NOT DETECTED Final   Enterobacterales NOT DETECTED NOT DETECTED Final   Enterobacter cloacae complex NOT DETECTED NOT DETECTED Final   Escherichia coli NOT DETECTED NOT DETECTED Final   Klebsiella aerogenes NOT DETECTED NOT DETECTED Final   Klebsiella oxytoca NOT DETECTED NOT DETECTED Final   Klebsiella pneumoniae NOT DETECTED NOT DETECTED Final   Proteus species NOT DETECTED NOT DETECTED Final   Salmonella species NOT DETECTED NOT DETECTED Final   Serratia marcescens NOT DETECTED NOT DETECTED Final   Haemophilus influenzae NOT DETECTED NOT DETECTED Final   Neisseria meningitidis NOT DETECTED NOT DETECTED Final   Pseudomonas aeruginosa NOT DETECTED NOT DETECTED Final   Stenotrophomonas maltophilia NOT DETECTED NOT DETECTED Final   Candida albicans NOT DETECTED NOT DETECTED Final   Candida auris NOT DETECTED NOT DETECTED Final   Candida glabrata NOT DETECTED NOT DETECTED Final   Candida krusei NOT DETECTED NOT DETECTED Final   Candida parapsilosis NOT DETECTED NOT DETECTED Final   Candida tropicalis NOT DETECTED NOT DETECTED Final   Cryptococcus neoformans/gattii NOT DETECTED NOT DETECTED Final    Comment: Performed at [facility], [address] [place], [place] [address]  Culture, blood (Routine X 2) w Reflex to ID Panel     Status: None (Preliminary result)   Collection Time: 07/24/24  5:50 AM  Specimen: BLOOD RIGHT ARM  Result Value Ref Range Status   Specimen Description BLOOD RIGHT ARM  Final   Special Requests   Final    BOTTLES DRAWN AEROBIC ONLY Blood Culture results may not be optimal due to an inadequate volume of blood received in culture bottles   Culture   Final    NO GROWTH 3 DAYS Performed at [facility], [address] [place], [place] [address]    Report Status PENDING  Incomplete  Culture, blood (Routine X 2) w Reflex to ID Panel     Status: None (Preliminary result)   Collection Time: 07/24/24 11:28 AM   Specimen: BLOOD RIGHT ARM  Result Value Ref Range Status   Specimen Description BLOOD RIGHT ARM  Final   Special Requests   Final    BOTTLES DRAWN AEROBIC AND ANAEROBIC Blood Culture adequate volume   Culture   Final    NO GROWTH 3 DAYS Performed at [facility], [address], [place], [place] [address]    Report Status PENDING  Incomplete     Radiology Studies: No results found.  Scheduled Meds:  atenolol   25 mg Oral QPM   cholecalciferol   2,000 Units Oral Daily   clonazepam   0.5 mg Oral QHS   cloNIDine   0.2 mg Oral BID   escitalopram   10 mg Oral Daily   famotidine   20 mg Oral Daily   feeding supplement  237 mL Oral BID BM   heparin  injection (subcutaneous)  5,000 Units Subcutaneous Q8H   insulin  aspart  0-9 Units Subcutaneous TID WC   irbesartan   300 mg Oral Daily   pravastatin   20 mg Oral QODAY   Continuous Infusions:  cefTRIAXone  (ROCEPHIN )  IV 1 g (07/26/24 1611)     LOS: 5 days   Fredia Skeeter, MD [facility]  07/27/2024, 1:53 PM   *Please note that this is a verbal dictation therefore any spelling or grammatical errors are due to the Dragon Medical One system interpretation.  Please page via Amion and do not message via secure chat for urgent patient care matters. Secure chat can be used for non urgent patient care matters.  How to contact the [facility] Attending or Consulting provider 7A - 7P or covering provider during after hours 7P -7A, for this patient?  Check the care team in [facility] and look for a) attending/consulting [facility] provider listed and b) the [facility] team listed. Page or secure chat 7A-7P. Log into [email or web address] and use [facility] universal password to access. If you do not have the password, please contact the hospital operator. Locate the [facility]  provider you are looking for under [facility] and page to a number that you can be directly reached. If you still have difficulty reaching the provider, please page the [facility] (Director on Call) for the Hospitalists listed on amion for assistance.

## 2024-07-27 NOTE — Plan of Care (Signed)

## 2024-07-27 NOTE — Progress Notes (Signed)
 Patient blood pressure obtained at 1756 224/110. This is the recheck and manual blood pressure. Given a one time dose of hydralazine  PO because patient does not have working IV. IV team consult placed. Recheck blood pressure after dose of atenolol  and hydralazine . Current 172/65. Will continue to monitor as pressure medicine digest more. MD made aware, charge nurse made aware, and will report off to night shift nurse.

## 2024-07-28 DIAGNOSIS — G9341 Metabolic encephalopathy: Secondary | ICD-10-CM | POA: Diagnosis not present

## 2024-07-28 LAB — BASIC METABOLIC PANEL WITH GFR
Anion gap: 6 (ref 5–15)
BUN: 17 mg/dL (ref 8–23)
CO2: 28 mmol/L (ref 22–32)
Calcium: 8.7 mg/dL — ABNORMAL LOW (ref 8.9–10.3)
Chloride: 101 mmol/L (ref 98–111)
Creatinine, Ser: 0.85 mg/dL (ref 0.44–1.00)
GFR, Estimated: >60 mL/min (ref >60)
Glucose, Bld: 136 mg/dL — ABNORMAL HIGH (ref 70–99)
Potassium: 4.4 mmol/L (ref 3.5–5.1)
Sodium: 135 mmol/L (ref 135–145)

## 2024-07-28 LAB — CBC
HCT: 40.5 % (ref 36.0–46.0)
Hemoglobin: 13.6 g/dL (ref 12.0–15.0)
MCH: 32.8 pg (ref 26.0–34.0)
MCHC: 33.6 g/dL (ref 30.0–36.0)
MCV: 97.6 fL (ref 80.0–100.0)
Platelets: 210 K/uL (ref 150–400)
RBC: 4.15 MIL/uL (ref 3.87–5.11)
RDW: 12.1 % (ref 11.5–15.5)
WBC: 10.1 K/uL (ref 4.0–10.5)
nRBC: 0 % (ref 0.0–0.2)

## 2024-07-28 LAB — GLUCOSE, CAPILLARY
Glucose-Capillary: 151 mg/dL — ABNORMAL HIGH (ref 70–99)
Glucose-Capillary: 155 mg/dL — ABNORMAL HIGH (ref 70–99)
Glucose-Capillary: 162 mg/dL — ABNORMAL HIGH (ref 70–99)

## 2024-07-28 MED ORDER — HYDRALAZINE HCL 50 MG PO TABS
50.0000 mg | ORAL_TABLET | Freq: Three times a day (TID) | ORAL | Status: DC
  Administered 2024-07-28 (×2): 50 mg via ORAL
  Filled 2024-07-28 (×2): qty 1

## 2024-07-28 MED ORDER — HYDRALAZINE HCL 50 MG PO TABS: 50.0000 mg | ORAL_TABLET | Freq: Three times a day (TID) | ORAL | 0 refills | Status: DC

## 2024-07-28 NOTE — TOC Progression Note (Signed)
 Transition of Care [facility]) - Progression Note    Patient Details  Name: Monica Faulkner MRN: [id number] Date of Birth: 11/01/1940  Transition of Care [facility]) CM/SW Contact  Sherline Clack, [place] Phone Number: 07/28/2024, 8:44 AM  Clinical Narrative:     Patient's shara was approved for [facility] 12/10-12/12, auth ID: [id number]. Provider and facility made aware.   Expected Discharge Plan: Skilled Nursing Facility Barriers to Discharge: Continued Medical Work up               Expected Discharge Plan and Services       Living arrangements for the past 2 months: Single Family Home Expected Discharge Date: 07/27/24                                     Social Drivers of Health (SDOH) Interventions SDOH Screenings   Food Insecurity: Patient Unable To Answer (07/23/2024)  Housing: Patient Unable To Answer (07/23/2024)  Transportation Needs: Patient Unable To Answer (07/23/2024)  Utilities: Patient Unable To Answer (07/23/2024)  Social Connections: Patient Unable To Answer (07/23/2024)  Tobacco Use: Low Risk (07/22/2024)    Readmission Risk Interventions     No data to display

## 2024-07-28 NOTE — Progress Notes (Signed)
 Unsuccessful attempt x2 to call report to [facility]. SW made aware.

## 2024-07-28 NOTE — Progress Notes (Signed)
 Recheck.

## 2024-07-28 NOTE — TOC Transition Note (Addendum)
 Transition of Care [facility]) - Discharge Note   Patient Details  Name: Monica Faulkner MRN: [id number] Date of Birth: 13-Feb-1941  Transition of Care [facility]) CM/SW Contact:  Sherline Clack, LCSWA Phone Number: 07/28/2024, 11:15 AM   Clinical Narrative:     Patient will DC to: [facility] Anticipated DC date: 07/28/2024  Family notified: [name] Albe (left VM), APS Social Worker Nat Chang Transport by: [facility]   Per MD patient ready for DC to [facility]. RN to call report prior to discharge [phone], rm [phone]). RN, patient, patient's family, and facility notified of DC. Discharge Summary and FL2 sent to facility. DC packet on chart. Ambulance transport requested for patient. CSW reached out to Nat Chang [phone]), APS social worker following patient, to provide update on disposition.  CSW will sign off for now as social work intervention is no longer needed. Please consult us  again if new needs arise.    Final next level of care: Skilled Nursing Facility Barriers to Discharge: Barriers Resolved   Patient Goals and CMS Choice     Choice offered to / list presented to : Patient      Discharge Placement   Existing PASRR number confirmed : 07/28/24          Patient chooses bed at: [facility] Patient to be transferred to facility by: [facility] Name of family member notified: [name] Coral and Nat Draper/APS social worker Patient and family notified of of transfer: 07/28/24  Discharge Plan and Services Additional resources added to the After Visit Summary for                                       Social Drivers of Health (SDOH) Interventions SDOH Screenings   Food Insecurity: Patient Unable To Answer (07/23/2024)  Housing: Patient Unable To Answer (07/23/2024)  Transportation Needs: Patient Unable To Answer (07/23/2024)  Utilities: Patient Unable To Answer (07/23/2024)  Social Connections: Patient Unable To Answer  (07/23/2024)  Tobacco Use: Low Risk (07/22/2024)     Readmission Risk Interventions     No data to display

## 2024-07-28 NOTE — Plan of Care (Signed)
°  Problem: Education: Goal: Ability to describe self-care measures that may prevent or decrease complications (Diabetes Survival Skills Education) will improve Outcome: Progressing   Problem: Coping: Goal: Ability to adjust to condition or change in health will improve Outcome: Progressing   Problem: Health Behavior/Discharge Planning: Goal: Ability to identify and utilize available resources and services will improve Outcome: Progressing   Problem: Pain Managment: Goal: General experience of comfort will improve and/or be controlled Outcome: Progressing   Problem: Safety: Goal: Ability to remain free from injury will improve Outcome: Progressing

## 2024-07-29 LAB — CULTURE, BLOOD (ROUTINE X 2)
Culture: NO GROWTH
Culture: NO GROWTH
Special Requests: ADEQUATE

## 2024-08-12 ENCOUNTER — Inpatient Hospital Stay (HOSPITAL_COMMUNITY)
Admission: EM | Admit: 2024-08-12 | Discharge: 2024-08-25 | DRG: 193 | Disposition: A | Discharge status: Skilled Nursing Facility | Source: Skilled Nursing Facility | Attending: Internal Medicine | Admitting: Internal Medicine

## 2024-08-12 ENCOUNTER — Emergency Department (HOSPITAL_COMMUNITY)

## 2024-08-12 ENCOUNTER — Other Ambulatory Visit: Payer: Self-pay

## 2024-08-12 DIAGNOSIS — J1001 Influenza due to other identified influenza virus with the same other identified influenza virus pneumonia: Secondary | ICD-10-CM | POA: Diagnosis present

## 2024-08-12 DIAGNOSIS — E871 Hypo-osmolality and hyponatremia: Secondary | ICD-10-CM | POA: Diagnosis present

## 2024-08-12 DIAGNOSIS — K922 Gastrointestinal hemorrhage, unspecified: Secondary | ICD-10-CM | POA: Diagnosis not present

## 2024-08-12 DIAGNOSIS — R6520 Severe sepsis without septic shock: Secondary | ICD-10-CM | POA: Diagnosis not present

## 2024-08-12 DIAGNOSIS — E872 Acidosis, unspecified: Secondary | ICD-10-CM | POA: Diagnosis present

## 2024-08-12 DIAGNOSIS — Z1152 Encounter for screening for COVID-19: Secondary | ICD-10-CM

## 2024-08-12 DIAGNOSIS — I82452 Acute embolism and thrombosis of left peroneal vein: Secondary | ICD-10-CM | POA: Diagnosis present

## 2024-08-12 DIAGNOSIS — J9601 Acute respiratory failure with hypoxia: Secondary | ICD-10-CM | POA: Diagnosis not present

## 2024-08-12 DIAGNOSIS — J101 Influenza due to other identified influenza virus with other respiratory manifestations: Secondary | ICD-10-CM

## 2024-08-12 DIAGNOSIS — E876 Hypokalemia: Secondary | ICD-10-CM | POA: Diagnosis not present

## 2024-08-12 DIAGNOSIS — Z515 Encounter for palliative care: Secondary | ICD-10-CM

## 2024-08-12 DIAGNOSIS — Z781 Physical restraint status: Secondary | ICD-10-CM | POA: Diagnosis not present

## 2024-08-12 DIAGNOSIS — Z7984 Long term (current) use of oral hypoglycemic drugs: Secondary | ICD-10-CM | POA: Diagnosis not present

## 2024-08-12 DIAGNOSIS — N179 Acute kidney failure, unspecified: Secondary | ICD-10-CM | POA: Diagnosis present

## 2024-08-12 DIAGNOSIS — I5031 Acute diastolic (congestive) heart failure: Secondary | ICD-10-CM | POA: Diagnosis not present

## 2024-08-12 DIAGNOSIS — J189 Pneumonia, unspecified organism: Secondary | ICD-10-CM | POA: Diagnosis not present

## 2024-08-12 DIAGNOSIS — G9341 Metabolic encephalopathy: Secondary | ICD-10-CM | POA: Diagnosis present

## 2024-08-12 DIAGNOSIS — F419 Anxiety disorder, unspecified: Secondary | ICD-10-CM | POA: Diagnosis present

## 2024-08-12 DIAGNOSIS — Z66 Do not resuscitate: Secondary | ICD-10-CM | POA: Diagnosis present

## 2024-08-12 DIAGNOSIS — I4891 Unspecified atrial fibrillation: Secondary | ICD-10-CM | POA: Diagnosis not present

## 2024-08-12 DIAGNOSIS — Y95 Nosocomial condition: Secondary | ICD-10-CM | POA: Diagnosis present

## 2024-08-12 DIAGNOSIS — I5033 Acute on chronic diastolic (congestive) heart failure: Secondary | ICD-10-CM | POA: Diagnosis not present

## 2024-08-12 DIAGNOSIS — J09X1 Influenza due to identified novel influenza A virus with pneumonia: Secondary | ICD-10-CM | POA: Diagnosis present

## 2024-08-12 DIAGNOSIS — I2510 Atherosclerotic heart disease of native coronary artery without angina pectoris: Secondary | ICD-10-CM | POA: Diagnosis present

## 2024-08-12 DIAGNOSIS — E119 Type 2 diabetes mellitus without complications: Secondary | ICD-10-CM | POA: Diagnosis present

## 2024-08-12 DIAGNOSIS — E860 Dehydration: Secondary | ICD-10-CM | POA: Diagnosis present

## 2024-08-12 DIAGNOSIS — I2699 Other pulmonary embolism without acute cor pulmonale: Secondary | ICD-10-CM | POA: Diagnosis present

## 2024-08-12 DIAGNOSIS — K529 Noninfective gastroenteritis and colitis, unspecified: Secondary | ICD-10-CM | POA: Diagnosis present

## 2024-08-12 DIAGNOSIS — K219 Gastro-esophageal reflux disease without esophagitis: Secondary | ICD-10-CM | POA: Diagnosis present

## 2024-08-12 DIAGNOSIS — I10 Essential (primary) hypertension: Secondary | ICD-10-CM

## 2024-08-12 DIAGNOSIS — R54 Age-related physical debility: Secondary | ICD-10-CM | POA: Diagnosis present

## 2024-08-12 DIAGNOSIS — F32A Depression, unspecified: Secondary | ICD-10-CM | POA: Diagnosis present

## 2024-08-12 DIAGNOSIS — D62 Acute posthemorrhagic anemia: Secondary | ICD-10-CM | POA: Diagnosis present

## 2024-08-12 DIAGNOSIS — J9621 Acute and chronic respiratory failure with hypoxia: Secondary | ICD-10-CM | POA: Diagnosis present

## 2024-08-12 DIAGNOSIS — A419 Sepsis, unspecified organism: Secondary | ICD-10-CM

## 2024-08-12 DIAGNOSIS — E861 Hypovolemia: Secondary | ICD-10-CM | POA: Diagnosis present

## 2024-08-12 DIAGNOSIS — Z86711 Personal history of pulmonary embolism: Secondary | ICD-10-CM | POA: Diagnosis not present

## 2024-08-12 DIAGNOSIS — Z85828 Personal history of other malignant neoplasm of skin: Secondary | ICD-10-CM

## 2024-08-12 DIAGNOSIS — R933 Abnormal findings on diagnostic imaging of other parts of digestive tract: Secondary | ICD-10-CM | POA: Diagnosis not present

## 2024-08-12 DIAGNOSIS — I110 Hypertensive heart disease with heart failure: Secondary | ICD-10-CM | POA: Diagnosis present

## 2024-08-12 DIAGNOSIS — R195 Other fecal abnormalities: Secondary | ICD-10-CM | POA: Diagnosis not present

## 2024-08-12 DIAGNOSIS — G9340 Encephalopathy, unspecified: Secondary | ICD-10-CM | POA: Diagnosis not present

## 2024-08-12 DIAGNOSIS — D649 Anemia, unspecified: Secondary | ICD-10-CM | POA: Diagnosis present

## 2024-08-12 DIAGNOSIS — Z79899 Other long term (current) drug therapy: Secondary | ICD-10-CM

## 2024-08-12 DIAGNOSIS — J9610 Chronic respiratory failure, unspecified whether with hypoxia or hypercapnia: Secondary | ICD-10-CM | POA: Diagnosis not present

## 2024-08-12 LAB — COMPREHENSIVE METABOLIC PANEL WITH GFR
ALT: 29 U/L (ref 0–44)
AST: 28 U/L (ref 15–41)
Albumin: 3.2 g/dL — ABNORMAL LOW (ref 3.5–5.0)
Alkaline Phosphatase: 35 U/L — ABNORMAL LOW (ref 38–126)
Anion gap: 8 (ref 5–15)
BUN: 45 mg/dL — ABNORMAL HIGH (ref 8–23)
CO2: 23 mmol/L (ref 22–32)
Calcium: 8.9 mg/dL (ref 8.9–10.3)
Chloride: 102 mmol/L (ref 98–111)
Creatinine, Ser: 1.33 mg/dL — ABNORMAL HIGH (ref 0.44–1.00)
GFR, Estimated: 40 mL/min — ABNORMAL LOW
Glucose, Bld: 148 mg/dL — ABNORMAL HIGH (ref 70–99)
Potassium: 4.8 mmol/L (ref 3.5–5.1)
Sodium: 133 mmol/L — ABNORMAL LOW (ref 135–145)
Total Bilirubin: 0.4 mg/dL (ref 0.0–1.2)
Total Protein: 5 g/dL — ABNORMAL LOW (ref 6.5–8.1)

## 2024-08-12 LAB — CBC WITH DIFFERENTIAL/PLATELET
Abs Immature Granulocytes: 0.23 K/uL — ABNORMAL HIGH (ref 0.00–0.07)
Basophils Absolute: 0 K/uL (ref 0.0–0.1)
Basophils Relative: 0 %
Eosinophils Absolute: 0 K/uL (ref 0.0–0.5)
Eosinophils Relative: 0 %
HCT: 14.1 % — ABNORMAL LOW (ref 36.0–46.0)
Hemoglobin: 4.5 g/dL — CL (ref 12.0–15.0)
Immature Granulocytes: 3 %
Lymphocytes Relative: 10 %
Lymphs Abs: 0.9 K/uL (ref 0.7–4.0)
MCH: 32.6 pg (ref 26.0–34.0)
MCHC: 31.9 g/dL (ref 30.0–36.0)
MCV: 102.2 fL — ABNORMAL HIGH (ref 80.0–100.0)
Monocytes Absolute: 0.6 K/uL (ref 0.1–1.0)
Monocytes Relative: 7 %
Neutro Abs: 7.1 K/uL (ref 1.7–7.7)
Neutrophils Relative %: 80 %
Platelets: 371 K/uL (ref 150–400)
RBC: 1.38 MIL/uL — ABNORMAL LOW (ref 3.87–5.11)
RDW: 13.5 % (ref 11.5–15.5)
WBC: 8.8 K/uL (ref 4.0–10.5)
nRBC: 6.9 % — ABNORMAL HIGH (ref 0.0–0.2)

## 2024-08-12 LAB — CBG MONITORING, ED: Glucose-Capillary: 148 mg/dL — ABNORMAL HIGH (ref 70–99)

## 2024-08-12 LAB — URINALYSIS, ROUTINE W REFLEX MICROSCOPIC
Bilirubin Urine: NEGATIVE
Glucose, UA: NEGATIVE mg/dL
Hgb urine dipstick: NEGATIVE
Ketones, ur: NEGATIVE mg/dL
Leukocytes,Ua: NEGATIVE
Nitrite: NEGATIVE
Protein, ur: NEGATIVE mg/dL
Specific Gravity, Urine: 1.011 (ref 1.005–1.030)
pH: 5 (ref 5.0–8.0)

## 2024-08-12 LAB — RESP PANEL BY RT-PCR (RSV, FLU A&B, COVID)  RVPGX2
Influenza A by PCR: POSITIVE — AB
Influenza B by PCR: NEGATIVE
Resp Syncytial Virus by PCR: NEGATIVE
SARS Coronavirus 2 by RT PCR: NEGATIVE

## 2024-08-12 LAB — I-STAT CG4 LACTIC ACID, ED: Lactic Acid, Venous: 1.1 mmol/L (ref 0.5–1.9)

## 2024-08-12 LAB — MAGNESIUM: Magnesium: 1.9 mg/dL (ref 1.7–2.4)

## 2024-08-12 LAB — ABO/RH: ABO/RH(D): AB NEG

## 2024-08-12 LAB — PRO BRAIN NATRIURETIC PEPTIDE: Pro Brain Natriuretic Peptide: 5942 pg/mL — ABNORMAL HIGH

## 2024-08-12 LAB — POC OCCULT BLOOD, ED: Fecal Occult Bld: POSITIVE — AB

## 2024-08-12 LAB — TROPONIN T, HIGH SENSITIVITY: Troponin T High Sensitivity: 30 ng/L — ABNORMAL HIGH (ref 0–19)

## 2024-08-12 LAB — PREPARE RBC (CROSSMATCH)

## 2024-08-12 MED ORDER — ORAL CARE MOUTH RINSE: 15.0000 mL | OROMUCOSAL | Status: DC | PRN

## 2024-08-12 MED ORDER — IOHEXOL 350 MG/ML SOLN
100.0000 mL | Freq: Once | INTRAVENOUS | Status: AC | PRN
  Administered 2024-08-12: 100 mL via INTRAVENOUS

## 2024-08-12 MED ORDER — OSELTAMIVIR PHOSPHATE 30 MG PO CAPS
30.0000 mg | ORAL_CAPSULE | Freq: Every day | ORAL | Status: AC
  Administered 2024-08-13 – 2024-08-16 (×5): 30 mg via ORAL
  Filled 2024-08-12 (×4): qty 1

## 2024-08-12 MED ORDER — PANTOPRAZOLE SODIUM 40 MG IV SOLR: 40.0000 mg | Freq: Once | INTRAVENOUS | Status: DC

## 2024-08-12 MED ORDER — PANTOPRAZOLE SODIUM 40 MG IV SOLR
80.0000 mg | Freq: Once | INTRAVENOUS | Status: AC
  Administered 2024-08-12: 80 mg via INTRAVENOUS
  Filled 2024-08-12: qty 20

## 2024-08-12 MED ORDER — SODIUM CHLORIDE 0.9 % IV SOLN
2.0000 g | INTRAVENOUS | Status: AC
  Administered 2024-08-13 – 2024-08-17 (×6): 2 g via INTRAVENOUS
  Filled 2024-08-12 (×3): qty 20

## 2024-08-12 MED ORDER — SODIUM CHLORIDE 0.9 % IV SOLN
500.0000 mg | INTRAVENOUS | Status: AC
  Administered 2024-08-12 – 2024-08-14 (×3): 500 mg via INTRAVENOUS
  Filled 2024-08-12 (×5): qty 5

## 2024-08-12 MED ORDER — SODIUM CHLORIDE 0.9 % IV SOLN
2.0000 g | Freq: Once | INTRAVENOUS | Status: AC
  Administered 2024-08-12: 2 g via INTRAVENOUS
  Filled 2024-08-12: qty 12.5

## 2024-08-12 MED ORDER — INSULIN ASPART 100 UNIT/ML IJ SOLN
0.0000 [IU] | Freq: Three times a day (TID) | INTRAMUSCULAR | Status: DC
  Administered 2024-08-13 – 2024-08-14 (×3): 2 [IU] via SUBCUTANEOUS
  Administered 2024-08-14: 3 [IU] via SUBCUTANEOUS
  Administered 2024-08-15: 1 [IU] via SUBCUTANEOUS
  Administered 2024-08-15: 2 [IU] via SUBCUTANEOUS
  Administered 2024-08-15 – 2024-08-16 (×2): 1 [IU] via SUBCUTANEOUS
  Administered 2024-08-16 – 2024-08-18 (×6): 2 [IU] via SUBCUTANEOUS
  Administered 2024-08-18: 1 [IU] via SUBCUTANEOUS
  Administered 2024-08-19: 3 [IU] via SUBCUTANEOUS
  Administered 2024-08-19: 2 [IU] via SUBCUTANEOUS
  Administered 2024-08-20: 1 [IU] via SUBCUTANEOUS
  Administered 2024-08-20: 2 [IU] via SUBCUTANEOUS
  Administered 2024-08-21 (×2): 1 [IU] via SUBCUTANEOUS
  Administered 2024-08-21: 2 [IU] via SUBCUTANEOUS
  Administered 2024-08-22: 3 [IU] via SUBCUTANEOUS
  Administered 2024-08-22 – 2024-08-23 (×3): 2 [IU] via SUBCUTANEOUS
  Administered 2024-08-24: 3 [IU] via SUBCUTANEOUS
  Administered 2024-08-24 (×2): 2 [IU] via SUBCUTANEOUS
  Administered 2024-08-25: 3 [IU] via SUBCUTANEOUS
  Administered 2024-08-25: 5 [IU] via SUBCUTANEOUS
  Administered 2024-08-25: 2 [IU] via SUBCUTANEOUS
  Filled 2024-08-12: qty 1
  Filled 2024-08-12: qty 3
  Filled 2024-08-12: qty 2
  Filled 2024-08-12: qty 3
  Filled 2024-08-12: qty 1
  Filled 2024-08-12: qty 3
  Filled 2024-08-12 (×2): qty 2
  Filled 2024-08-12: qty 5
  Filled 2024-08-12 (×4): qty 2
  Filled 2024-08-12: qty 3
  Filled 2024-08-12 (×3): qty 2

## 2024-08-12 MED ORDER — HYDRALAZINE HCL 20 MG/ML IJ SOLN
10.0000 mg | Freq: Four times a day (QID) | INTRAMUSCULAR | Status: DC | PRN
  Administered 2024-08-13 (×2): 10 mg via INTRAVENOUS
  Filled 2024-08-12 (×2): qty 1

## 2024-08-12 MED ORDER — VANCOMYCIN HCL IN DEXTROSE 1-5 GM/200ML-% IV SOLN
1000.0000 mg | Freq: Once | INTRAVENOUS | Status: AC
  Administered 2024-08-12: 1000 mg via INTRAVENOUS
  Filled 2024-08-12: qty 200

## 2024-08-12 MED ORDER — PANTOPRAZOLE SODIUM 40 MG IV SOLR
40.0000 mg | Freq: Two times a day (BID) | INTRAVENOUS | Status: DC
  Administered 2024-08-13 – 2024-08-18 (×12): 40 mg via INTRAVENOUS
  Filled 2024-08-12 (×7): qty 10

## 2024-08-12 MED ORDER — PROMETHAZINE HCL 12.5 MG PO TABS: 12.5000 mg | ORAL_TABLET | Freq: Four times a day (QID) | ORAL | Status: DC | PRN

## 2024-08-12 MED ORDER — MORPHINE SULFATE (PF) 2 MG/ML IV SOLN
2.0000 mg | INTRAVENOUS | Status: DC | PRN
  Administered 2024-08-14 – 2024-08-17 (×4): 2 mg via INTRAVENOUS
  Filled 2024-08-12 (×3): qty 1

## 2024-08-12 MED ORDER — INSULIN ASPART 100 UNIT/ML IJ SOLN
0.0000 [IU] | Freq: Every day | INTRAMUSCULAR | Status: DC
  Administered 2024-08-13 – 2024-08-18 (×2): 2 [IU] via SUBCUTANEOUS
  Filled 2024-08-12: qty 2

## 2024-08-12 MED ORDER — SODIUM CHLORIDE 0.9% IV SOLUTION: Freq: Once | INTRAVENOUS | Status: DC

## 2024-08-12 MED ORDER — CHLORHEXIDINE GLUCONATE CLOTH 2 % EX PADS
6.0000 | MEDICATED_PAD | Freq: Every day | CUTANEOUS | Status: DC
  Administered 2024-08-14 – 2024-08-19 (×6): 6 via TOPICAL

## 2024-08-12 MED ORDER — METRONIDAZOLE 500 MG/100ML IV SOLN
500.0000 mg | Freq: Two times a day (BID) | INTRAVENOUS | Status: AC
  Administered 2024-08-13 – 2024-08-18 (×13): 500 mg via INTRAVENOUS
  Filled 2024-08-12 (×7): qty 100

## 2024-08-12 MED ORDER — LACTATED RINGERS IV BOLUS
1000.0000 mL | Freq: Once | INTRAVENOUS | Status: AC
  Administered 2024-08-12: 1000 mL via INTRAVENOUS

## 2024-08-12 MED ORDER — LACTATED RINGERS IV SOLN: INTRAVENOUS | Status: DC

## 2024-08-12 NOTE — ED Provider Notes (Signed)
 " [place] [facility] Provider Note   HPI/ROS    History obtained from patient and EMR.  Monica Faulkner is a 83 y.o. female who presents for No chief complaint on file. and who  has a past medical history of Anxiety, Cancer (HCC), Complication of anesthesia, Depression, Diabetes mellitus, GERD (gastroesophageal reflux disease), H/O hiatal hernia, Headache(784.0), and Hypertension. Patient presents via EMS from [facility].  Patient was recently discharged from the hospital on 07/28/2024 for community-acquired pneumonia and acute metabolic encephalopathy.  Has known influenza at this time.  Per EMS was becoming hypoxic at the facility and they were concerned for sepsis which prompted them to call EMS.  On arrival she was satting in the 70s on nasal cannula, but improved to the 90s with nonrebreather.  She was otherwise hemodynamically stable with blood pressure of 138/60 and a glucose of 190.  She is A and O x 3 at baseline, but is roughly ANO x 1-2 right now.  She denies chest pain, headache, abdominal pain, vomiting, or diarrhea.  Also currently denies shortness of breath.  MDM   I have reviewed the nursing documentation, vital signs, as well as the past medical history, surgical history, family history, and social history.  Initial Assessment:  Patient tachypneic and hypoxic on initial presentation.  Taken off nonrebreather and placed on nasal cannula with further desaturations down into the mid 80s.  Placed on high flow nasal cannula afterward with improvement up to mid to high 90s.  Concern for sepsis at this time.  Also concern for anemia as patient is very pale.  Has poor skin turgor.  Difficult obtain good IVC view on bedside POCUS, but would presume patient is hypovolemic.  Does have some lower extremity edema, but no history of heart failure.  Glucose here normal at 148.  Lactate within normal limits at 1 point.  Will obtain full sepsis workup as  well as chest x-ray and chest CT.  Will antibiosis early with vancomycin  and cefepime .  Will also add on 1 L of LR and determine need for further volume resuscitation afterwards.  Spoke with the brother on the phone.  He states that he believes she would like to be fully resuscitated if anything were to happen to her today.  He said that they have never really talked about it, but he thinks that they would be okay with a breathing tube and chest compressions if the need were to arise.  EKG here normal sinus rhythm with no obvious ischemia, dysrhythmia, or high-grade AV block.  Nonspecific ST-T wave changes.  Patient's hemoglobin resulted critically low at 4.5.  No bright red blood per rectum on bedside rectal exam.  Will send Hemoccult.  Also obtain CT angio chest abdomen pelvis rather than CT chest to evaluate for possible GI bleed as well.  Likely hypoxic in the setting of severe anemia with decreased oxygen binding capacity.  AKI noted on CMP with mild hyponatremia.  BUN acutely elevated as well.  No signs of transaminitis or other significant electrolyte abnormality.  Patient given 2 units of blood and Protonix .  CT angio with evidence of PE without obvious evidence of active extrav/acute GI bleed.  Does have evidence of colitis as well.  Flu a positive here.  With mildly elevated troponin and BNP likely in the setting of flu and PE.  UA with no evidence of UTI.  Spoke with ICU regarding admission and they feel that patient is stable for  stepdown.  Spoke with hospitalist team regarding admission and they agree.  Patient mated to the hospitalist service for further workup and management of sepsis and severe anemia.  Decision made to hold off on anticoagulation at this time given patient has no signs of right heart strain or hemodynamic instability and has had significant hemoglobin drop.  Disposition:  I discussed the case with Garba who graciously agreed to admit the patient to their service for  continued care.   This patient was staffed with Dr. Franklyn who supervised the visit and agreed with the plan of care.   Due to the patients current presenting symptoms, physical exam findings, and the workup stated above, it is thought that the etiology of the patients current presentation is:  1. Acute hypoxic respiratory failure (HCC)   2. Anemia, unspecified type   3. Sepsis, due to unspecified organism, unspecified whether acute organ dysfunction present (HCC)   4. AKI (acute kidney injury)    Clinical Complexity A medically appropriate history, review of systems, and physical exam was performed.  Factors that affect the complexity of this encounter: assessment of correct protocol, laboratory work from this visit, notes from other physicians (family medicine), and review of echocardiogram/EKG results  My independent interpretations of diagnostic studies are documented in the ED course above.   If decision rules were used in this patient's evaluation, they are listed below.   Click here for ABCD2, HEART and other calculators  Patient's presentation is most consistent with acute presentation with potential threat to life or bodily function.  MDM generated using voice dictation software and may contain dictation errors. Please contact me for any clarification or with any questions.    Physical Exam, PMH, PSH, Family History, and Social Hsitory   Vitals:   08/12/24 2058 08/12/24 2115 08/12/24 2130 08/12/24 2205  BP: (!) 164/62 137/71 (!) 153/62 (!) 163/66  Pulse: 81 81 73 81  Resp: 20 18 17 20   Temp: 98.5 F (36.9 C) 99.2 F (37.3 C) 99.2 F (37.3 C) 98.2 F (36.8 C)  TempSrc: Oral   Oral  SpO2:  99% 100% 96%  Weight:      Height:        Physical Exam Constitutional:      Appearance: She is ill-appearing.  HENT:     Head: Normocephalic and atraumatic.     Nose: Nose normal.     Mouth/Throat:     Mouth: Mucous membranes are dry.     Pharynx: Oropharynx is clear.   Eyes:     Extraocular Movements: Extraocular movements intact.     Conjunctiva/sclera: Conjunctivae normal.  Cardiovascular:     Rate and Rhythm: Normal rate and regular rhythm.  Pulmonary:     Effort: Pulmonary effort is normal. No respiratory distress.     Breath sounds: No stridor. Rhonchi present.  Abdominal:     General: Abdomen is flat. There is no distension.     Palpations: Abdomen is soft.     Tenderness: There is no abdominal tenderness. There is no guarding or rebound.  Musculoskeletal:        General: Normal range of motion.     Cervical back: Normal range of motion.     Right lower leg: Edema present.     Left lower leg: Edema present.  Skin:    General: Skin is warm and dry.     Capillary Refill: Capillary refill takes less than 2 seconds.     Coloration: Skin is pale.  Neurological:     General: No focal deficit present.     Mental Status: She is oriented to person, place, and time.     Past Medical History:  Diagnosis Date   Anxiety    seen at [facility], for extreme anxiety. As a result of her anxiety  she had ECHO   Cancer (HCC)    basal cell    Complication of anesthesia    atropine  increased BP & increased pulse   Depression    Diabetes mellitus    type 2- 2006, managed with diet   GERD (gastroesophageal reflux disease)    rare occurence   H/O hiatal hernia    Headache(784.0)    caused by sinus congestion fr. time to time    Hypertension      Past Surgical History:  Procedure Laterality Date   ABDOMINAL HYSTERECTOMY     1991-scar later had to be further repaired    APPENDECTOMY     L ovarian cyst - ruptured & appendectomy done at same time.    EYE SURGERY     R eye- macular hole repair- 2008   GAS INSERTION  03/18/2012   Procedure: INSERTION OF GAS;  Surgeon: Norleen JONETTA Ku, MD;  Location: [facility] OR;  Service: Ophthalmology;  Laterality: Left;   HERNIA REPAIR     1974- inguinal repair   PARS PLANA VITRECTOMY  03/18/2012   Procedure: PARS PLANA  VITRECTOMY WITH 25 GAUGE;  Surgeon: Norleen JONETTA Ku, MD;  Location: [facility] OR;  Service: Ophthalmology;  Laterality: Left;  25 GAUGE PPV FOR MACULAR HOLE   TEAR DUCT PROBING     as an infant   TONSILLECTOMY     and adenioidectomy- as a child   VITRECTOMY  03/18/2012     No family history on file.  Social History   Tobacco Use   Smoking status: Never   Smokeless tobacco: Never  Substance Use Topics   Alcohol use: No     Procedures   If procedures were preformed on this patient, they are listed below:  Procedures   Electronically signed by:   Glendia Carlin Ancona, M.D. PGY-2, Emergency Medicine   Please note that this documentation was produced with the assistance of voice-to-text technology and may contain errors.    Ancona Glendia, MD 08/12/24 2252    Franklyn Sid SAILOR, MD 08/14/24 1750    Franklyn Sid SAILOR, MD 08/14/24 1750  "

## 2024-08-12 NOTE — ED Notes (Signed)
[facility] called for transport to [facility] 

## 2024-08-12 NOTE — Consult Note (Addendum)
 "  NAME:  Monica Faulkner, MRN:  [id number], DOB:  Jan 08, 1941, LOS: 0 ADMISSION DATE:  08/12/2024, CONSULTATION DATE:  08/12/2024 REFERRING MD:  Sid Boning, MD, CHIEF COMPLAINT:  Respiratory distress  History of Present Illness:  83 y/o female with PMH for Basal cell carcinoma, DMT2, GERD, HTN, Anxiety, Depression brought in for respiratory distress, positive flu and pneumonia. She was admitted on 12/5 for weakness and hypotension and treated for severe dehydration.   Currently she is on high flow [place] 60% and 60L.  She was found to have pneumonia bibasilar, with flu positive and right sided PE subacute/chronic.  She was also found to have HgB 4.5 and stool occult positive but no active bleed.  Pertinent  Medical History  Basal cell carcinoma, DMT2, GERD, HTN, Anxiety, Depression  Significant Hospital Events: Including procedures, antibiotic start and stop dates in addition to other pertinent events   12/26: Admit to Stepdown  Interim History / Subjective:  N/a  Objective    Blood pressure (!) 153/62, pulse 73, temperature 99.2 F (37.3 C), resp. rate 17, height 5' 5 (1.651 m), weight 60.5 kg, SpO2 100%.    FiO2 (%):  [60 %-100 %] 60 %   Intake/Output Summary (Last 24 hours) at 08/12/2024 2146 Last data filed at 08/12/2024 2028 Gross per 24 hour  Intake 406.93 ml  Output --  Net 406.93 ml   Filed Weights   08/12/24 1559  Weight: 60.5 kg    Examination: General: Pale, lethargic on high flow, NAD HENT: PERRLA no icterus, neck supple Lungs: diminished bs pos, no rhonchi Cardiovascular: reg s1s2 no murmurs Abdomen: Soft nt nd bs pos no guarding Extremities: 2+ pitting LE edema, no cyanosis, clubbing or edema Neuro: Lethargic, no focal deficits, CN II to XII grossly intact   Resolved problem list   Assessment and Plan  Acute hypoxic respiratory failure  Influenza A Pneumonia Pulmonary emboli AKI ABLA GI bleed DMT2 HTN  At this time, she is not on vasopressors  and her O2 needs are improving.  She is getting her second unit PRBC. At this time although she is quite an infirm patient she would not benefit from ICU. GI was called for consult Broad spectrum antibiotics Tamiflu  IS and flutter HFNC and taper FIO2 and liter flow as tolerated Transfuse to desired HbG LE dopplers stat, if DVT then IR for IVC filter, if no DVT, no treatment except SCDs  Highly suggest a Palliative Care Consult. Labs   CBC: Recent Labs  Lab 08/12/24 1618  WBC 8.8  NEUTROABS 7.1  HGB 4.5*  HCT 14.1*  MCV 102.2*  PLT 371    Basic Metabolic Panel: Recent Labs  Lab 08/12/24 1618  NA 133*  K 4.8  CL 102  CO2 23  GLUCOSE 148*  BUN 45*  CREATININE 1.33*  CALCIUM 8.9  MG 1.9   GFR: Estimated Creatinine Clearance: 29.3 mL/min (A) (by C-G formula based on SCr of 1.33 mg/dL (H)). Recent Labs  Lab 08/12/24 1617 08/12/24 1618  WBC  --  8.8  LATICACIDVEN 1.1  --     Liver Function Tests: Recent Labs  Lab 08/12/24 1618  AST 28  ALT 29  ALKPHOS 35*  BILITOT 0.4  PROT 5.0*  ALBUMIN 3.2*   No results for input(s): LIPASE, AMYLASE in the last 168 hours. No results for input(s): AMMONIA in the last 168 hours.  ABG    Component Value Date/Time   HCO3 24.5 07/22/2024 1620   TCO2 26 07/22/2024  1620   O2SAT 84 07/22/2024 1620     Coagulation Profile: No results for input(s): INR, PROTIME in the last 168 hours.  Cardiac Enzymes: No results for input(s): CKTOTAL, CKMB, CKMBINDEX, TROPONINI in the last 168 hours.  HbA1C: Hgb A1c MFr Bld  Date/Time Value Ref Range Status  07/22/2024 04:00 PM 6.7 (H) 4.8 - 5.6 % Final    Comment:    (NOTE) Diagnosis of Diabetes The following HbA1c ranges recommended by the American Diabetes Association (ADA) may be used as an aid in the diagnosis of diabetes mellitus.  Hemoglobin             Suggested A1C NGSP%              Diagnosis  <5.7                   Non Diabetic  5.7-6.4                 Pre-Diabetic  >6.4                   Diabetic  <7.0                   Glycemic control for                       adults with diabetes.      CBG: Recent Labs  Lab 08/12/24 1611  GLUCAP 148*    Review of Systems:   Lethargic, denies chest pain or SOB, no NVD, denies frank blood in stool or urine, denies dark stools  Past Medical History:  She,  has a past medical history of Anxiety, Cancer (HCC), Complication of anesthesia, Depression, Diabetes mellitus, GERD (gastroesophageal reflux disease), H/O hiatal hernia, Headache(784.0), and Hypertension.   Surgical History:   Past Surgical History:  Procedure Laterality Date   ABDOMINAL HYSTERECTOMY     1991-scar later had to be further repaired    APPENDECTOMY     L ovarian cyst - ruptured & appendectomy done at same time.    EYE SURGERY     R eye- macular hole repair- 2008   GAS INSERTION  03/18/2012   Procedure: INSERTION OF GAS;  Surgeon: Norleen JONETTA Ku, MD;  Location: [facility] OR;  Service: Ophthalmology;  Laterality: Left;   HERNIA REPAIR     1974- inguinal repair   PARS PLANA VITRECTOMY  03/18/2012   Procedure: PARS PLANA VITRECTOMY WITH 25 GAUGE;  Surgeon: Norleen JONETTA Ku, MD;  Location: [facility] OR;  Service: Ophthalmology;  Laterality: Left;  25 GAUGE PPV FOR MACULAR HOLE   TEAR DUCT PROBING     as an infant   TONSILLECTOMY     and adenioidectomy- as a child   VITRECTOMY  03/18/2012     Social History:   reports that she has never smoked. She has never used smokeless tobacco. She reports that she does not drink alcohol and does not use drugs.   Family History:  Her family history is not on file.   Allergies Allergies[1]   Home Medications  Prior to Admission medications  Medication Sig Start Date End Date Taking? Authorizing Provider  amoxicillin -clavulanate (AUGMENTIN ) 500-125 MG tablet Take 1 tablet by mouth in the morning and at bedtime.   Yes [provider]  atenolol  (TENORMIN ) 25 MG tablet Take 25 mg  by mouth every evening.   Yes [provider]  bisacodyl (DULCOLAX) 10 MG suppository Place 10 mg  rectally as needed for moderate constipation.   Yes [provider]  cholecalciferol  (VITAMIN D ) 1000 units tablet Take 2,000 Units by mouth daily.   Yes [provider]  clonazePAM  (KLONOPIN ) 0.5 MG tablet Take 1 tablet (0.5 mg total) by mouth at bedtime. 07/27/24  Yes Vernon Ranks, MD  cloNIDine  (CATAPRES ) 0.1 MG tablet Take 2 tablets (0.2 mg total) by mouth 2 (two) times daily. 07/27/24  Yes Pahwani, Ranks, MD  escitalopram  (LEXAPRO ) 10 MG tablet Take 10 mg by mouth daily.   Yes [provider]  famotidine  (PEPCID ) 40 MG tablet Take 1 tablet (40 mg total) by mouth daily for 15 days. 04/30/21 08/12/24 Yes Tobie Litter, MD  Glucerna [facility]) LIQD Take 237 mLs by mouth daily.   Yes [provider]  hydrALAZINE  (APRESOLINE ) 50 MG tablet Take 1 tablet (50 mg total) by mouth every 8 (eight) hours. Patient taking differently: Take 100 mg by mouth every 8 (eight) hours. 07/28/24 08/27/24 Yes Pahwani, Ranks, MD  ibuprofen  (ADVIL ,MOTRIN ) 200 MG tablet Take 200 mg by mouth daily as needed for fever or mild pain.    Yes [provider]  ipratropium-albuterol  (DUONEB) 0.5-2.5 (3) MG/3ML SOLN Take 3 mLs by nebulization every 6 (six) hours as needed.   Yes [provider]  JANUVIA 100 MG tablet Take 100 mg by mouth daily. 04/01/18  Yes [provider]  Loperamide  HCl (IMODIUM  A-D PO) Take 2 tablets by mouth every hour as needed (diarrhea).   Yes [provider]  magnesium  hydroxide (MILK OF MAGNESIA) 400 MG/5ML suspension Take 30 mLs by mouth daily as needed for mild constipation.   Yes [provider]  mineral oil enema Place 1 enema rectally once.   Yes [provider]  Naphazoline HCl (CLEAR EYES OP) Place 1 drop into both eyes daily as needed (dry eyes).   Yes [provider]  oseltamivir  (TAMIFLU ) 75 MG  capsule Take 75 mg by mouth 2 (two) times daily.   Yes [provider]  pravastatin  (PRAVACHOL ) 20 MG tablet Take 20 mg by mouth every other day.  02/24/18  Yes [provider]  predniSONE (DELTASONE) 20 MG tablet Take 20 mg by mouth daily with breakfast. 3 days   Yes [provider]  telmisartan (MICARDIS) 80 MG tablet Take 80 mg by mouth daily. 04/17/21  Yes [provider]  ondansetron  (ZOFRAN -ODT) 4 MG disintegrating tablet Take 1 tablet (4 mg total) by mouth every 8 (eight) hours as needed. Patient not taking: Reported on 08/12/2024 11/15/23   Griselda Norris, MD     Critical care time: 56   The patient is critically ill with multiple organ system failure and requires high complexity decision making for assessment and support, frequent evaluation and titration of therapies, advanced monitoring, review of radiographic studies and interpretation of complex data.   Critical Care Time devoted to patient care services, exclusive of separately billable procedures, described in this note is 33 minutes.   Orlin Fairly, MD [facility] See Amion for pager  If no response to pager , please call [phone] until 7pm After 7:00 pm call Elink  [phone] 08/12/2024, 9:54 PM             [1]  Allergies Allergen Reactions   Avapro  [Irbesartan ] Other (See Comments)    Difficulty breathing, feeling like she was going to pass out   Calcium Channel Blockers     ineffective   Decongestant [Pseudoephedrine] Other (See  Comments)    Heart races , blood pressure goes up   Norvasc [Amlodipine Besylate] Itching, Other (See Comments) and Hypertension    Turned skin red   Sulfa Antibiotics Other (See Comments)    Made kidney infection worse   Zofran  [Ondansetron ] Diarrhea    Only ODT tablets cause this problem.    "

## 2024-08-12 NOTE — ED Notes (Signed)
 Attempted to place temp foley with no success

## 2024-08-12 NOTE — Progress Notes (Signed)
 eLink Physician-Brief Progress Note Patient Name: Monica Faulkner DOB: 22-May-1941 MRN: [id number]   Date of Service  08/12/2024  HPI/Events of Note  SBP in 180s and does not have PRNs in chart Also has LR ordered but fot 2 units RBC as well   eICU Interventions  Hold fluids PRN hydralazine  ordered Has serial CBC ordered Q6     Intervention Category Major Interventions: Hemorrhage - evaluation and management Evaluation Type: New Patient Evaluation  Monica Faulkner 08/12/2024, 11:56 PM

## 2024-08-12 NOTE — ED Notes (Signed)
[facility] cancelled 

## 2024-08-12 NOTE — ED Notes (Signed)
 2nd unit PRBC infusing with no adverse effect , respirations unlabored / afebrile , IV sites unremarkable .

## 2024-08-12 NOTE — ED Notes (Signed)
 2nd unit PRBC infusing at 120 ml/hr. IV sites unremarkable , afebrile /respirations unlabored.

## 2024-08-12 NOTE — Progress Notes (Signed)
 Pt transported from ED- ICU RN & RT at bedside, along with unit staff. VSS throughout

## 2024-08-12 NOTE — ED Triage Notes (Signed)
 Pt bib [facility] from [facility]. Pt has flu and pna. Reports respiratory distress and only comes above 90% O2 with NRB. Pt oriented to name. No hx of CHF.   138/60 194cbg 22g LFA

## 2024-08-12 NOTE — H&P (Signed)
 " History and Physical    Patient: Monica Faulkner FMW:[id number] DOB: 15-Jun-1941 DOA: 08/12/2024 DOS: the patient was seen and examined on 08/12/2024 PCP: Verdia Lombard, MD  Patient coming from: SNF  Chief Complaint: No chief complaint on file.  HPI: Monica Faulkner is a 83 y.o. female with medical history significant of basal cell carcinoma, type 2 diabetes, GERD, essential hypertension, anxiety disorder, depression, who was just discharged from the hospital after admission on December 15 with acute metabolic encephalopathy and severe dehydration.  Patient had AKI lactic acidosis at the time.  She was also noted to have sepsis of unknown source.  Following that admission patient was sent to [facility] rehab.  She did have lactic acidosis at the time.  Patient brought back today with a diagnosis of flu and pneumonia.  She has developed severe respiratory distress.  Was on nonrebreather back with oxygen saturation around 90%.  Patient barely arousable.  Was seen in the ER and evaluated.  Currently on 60% oxygen via high flow nasal cannula.  Hemoglobin was noted to be 4.5.  It was 13.6 at discharge 2 weeks ago.  Patient also had proBNP of 6000 with mildly elevated troponins.  She had AKI hyponatremia and CT findings of both pulmonary embolism as well as Colitis.  She is guaiac positive.  Patient has continued to be hypoxic so will be admitted for further evaluation and treatment.  Review of Systems: As mentioned in the history of present illness. All other systems reviewed and are negative. Past Medical History:  Diagnosis Date   Anxiety    seen at [facility], for extreme anxiety. As a result of her anxiety  she had ECHO   Cancer (HCC)    basal cell    Complication of anesthesia    atropine  increased BP & increased pulse   Depression    Diabetes mellitus    type 2- 2006, managed with diet   GERD (gastroesophageal reflux disease)    rare occurence   H/O hiatal hernia    Headache(784.0)     caused by sinus congestion fr. time to time    Hypertension    Past Surgical History:  Procedure Laterality Date   ABDOMINAL HYSTERECTOMY     1991-scar later had to be further repaired    APPENDECTOMY     L ovarian cyst - ruptured & appendectomy done at same time.    EYE SURGERY     R eye- macular hole repair- 2008   GAS INSERTION  03/18/2012   Procedure: INSERTION OF GAS;  Surgeon: Norleen JONETTA Ku, MD;  Location: [facility] OR;  Service: Ophthalmology;  Laterality: Left;   HERNIA REPAIR     1974- inguinal repair   PARS PLANA VITRECTOMY  03/18/2012   Procedure: PARS PLANA VITRECTOMY WITH 25 GAUGE;  Surgeon: Norleen JONETTA Ku, MD;  Location: [facility] OR;  Service: Ophthalmology;  Laterality: Left;  25 GAUGE PPV FOR MACULAR HOLE   TEAR DUCT PROBING     as an infant   TONSILLECTOMY     and adenioidectomy- as a child   VITRECTOMY  03/18/2012   Social History:  reports that she has never smoked. She has never used smokeless tobacco. She reports that she does not drink alcohol and does not use drugs.  Allergies[1]  No family history on file.  Prior to Admission medications  Medication Sig Start Date End Date Taking? Authorizing Provider  amoxicillin -clavulanate (AUGMENTIN ) 500-125 MG tablet Take 1 tablet by mouth in the morning and  at bedtime.   Yes [provider]  atenolol  (TENORMIN ) 25 MG tablet Take 25 mg by mouth every evening.   Yes [provider]  bisacodyl (DULCOLAX) 10 MG suppository Place 10 mg rectally as needed for moderate constipation.   Yes [provider]  cholecalciferol  (VITAMIN D ) 1000 units tablet Take 2,000 Units by mouth daily.   Yes [provider]  clonazePAM  (KLONOPIN ) 0.5 MG tablet Take 1 tablet (0.5 mg total) by mouth at bedtime. 07/27/24  Yes Vernon Ranks, MD  cloNIDine  (CATAPRES ) 0.1 MG tablet Take 2 tablets (0.2 mg total) by mouth 2 (two) times daily. 07/27/24  Yes Pahwani, Ranks, MD  escitalopram  (LEXAPRO ) 10 MG tablet Take 10 mg by mouth  daily.   Yes [provider]  famotidine  (PEPCID ) 40 MG tablet Take 1 tablet (40 mg total) by mouth daily for 15 days. 04/30/21 08/12/24 Yes Tobie Litter, MD  Glucerna [facility]) LIQD Take 237 mLs by mouth daily.   Yes [provider]  hydrALAZINE  (APRESOLINE ) 50 MG tablet Take 1 tablet (50 mg total) by mouth every 8 (eight) hours. Patient taking differently: Take 100 mg by mouth every 8 (eight) hours. 07/28/24 08/27/24 Yes Pahwani, Ranks, MD  ibuprofen  (ADVIL ,MOTRIN ) 200 MG tablet Take 200 mg by mouth daily as needed for fever or mild pain.    Yes [provider]  ipratropium-albuterol (DUONEB) 0.5-2.5 (3) MG/3ML SOLN Take 3 mLs by nebulization every 6 (six) hours as needed.   Yes [provider]  JANUVIA 100 MG tablet Take 100 mg by mouth daily. 04/01/18  Yes [provider]  Loperamide  HCl (IMODIUM  A-D PO) Take 2 tablets by mouth every hour as needed (diarrhea).   Yes [provider]  magnesium  hydroxide (MILK OF MAGNESIA) 400 MG/5ML suspension Take 30 mLs by mouth daily as needed for mild constipation.   Yes [provider]  mineral oil enema Place 1 enema rectally once.   Yes [provider]  Naphazoline HCl (CLEAR EYES OP) Place 1 drop into both eyes daily as needed (dry eyes).   Yes [provider]  oseltamivir  (TAMIFLU ) 75 MG capsule Take 75 mg by mouth 2 (two) times daily.   Yes [provider]  pravastatin  (PRAVACHOL ) 20 MG tablet Take 20 mg by mouth every other day.  02/24/18  Yes [provider]  predniSONE (DELTASONE) 20 MG tablet Take 20 mg by mouth daily with breakfast. 3 days   Yes [provider]  telmisartan (MICARDIS) 80 MG tablet Take 80 mg by mouth daily. 04/17/21  Yes [provider]  ondansetron  (ZOFRAN -ODT) 4 MG disintegrating tablet Take 1 tablet (4 mg total) by mouth every 8 (eight) hours as needed. Patient not taking: Reported on 08/12/2024 11/15/23   Griselda Norris, MD    Physical Exam: Vitals:   08/12/24 2015 08/12/24 2031 08/12/24 2047 08/12/24 2058  BP: (!) 153/58 (!) 148/55 125/67 (!) 164/62  Pulse: 80 78 80 81  Resp: (!) 26 18 19 20   Temp: 99 F (37.2 C) 98.4 F (36.9 C) 99.1 F (37.3 C) 98.5 F (36.9 C)  TempSrc:  Oral  Oral  SpO2: 93% (!) 86% 95%   Weight:      Height:       Constitutional: Acute on chronically ill looking, confused Eyes: PERRL, lids and conjunctivae normal ENMT: Mucous membranes are dry posterior pharynx clear of any exudate or lesions.Normal dentition.  Neck: normal, supple, no masses, no thyromegaly Respiratory: Decreased air entry bilaterally,  marked expiratory wheezing, patient with accessory muscle use Cardiovascular: Regular rate and rhythm, no murmurs / rubs / gallops. No extremity edema. 2+ pedal pulses. No carotid bruits.  Abdomen: no tenderness, no masses palpated. No hepatosplenomegaly. Bowel sounds positive.  Musculoskeletal: Good range of motion, no joint swelling or tenderness, Skin: no rashes, lesions, ulcers. No induration Neurologic: CN 2-12 grossly intact. Sensation intact, DTR normal. Strength 5/5 in all 4.  Psychiatric: Confused, arousable, communicating. Normal mood  Data Reviewed:  Temperature 97.4-99.2, respiratory of 26, blood pressure 165/105 oxygen sat 81% on 60 L of oxygen.  Sodium is 133, glucose 148, BUN 45 with creatinine 1.33 albumin 3.2.  proBNP is 5942.  Hemoglobin is 4.5 with MCV of 102 it was only 11 2 weeks ago.  Patient is influenza A positive.  Chest x-ray showed new right basilar airspace opacity that is suspicious for pneumonia.  CT angiogram of the chest showed circumferential mild bowel wall thickening and pericolonic inflammation of the descending colon consistent with colitis.  Peripheral right lower lobe consolidation which is consistent with pneumonia.  Also pulmonary embolism in the right central upper lobe of pulmonary artery probably subacute to chronic as well  as high-grade stenosis of the celiac axis at its origin.  Assessment and Plan:  #1 acute hypoxic respiratory failure: Multifactorial.  This includes pulmonary embolism, severe anemia, pneumonia, influenza infection.  Patient currently on 60 L of oxygen per minute.  Patient will be admitted to stepdown unit in the ICU and medical care.  She has a potential for deterioration and if that happens we will get intensivist involved.  We will treat these underlying causes.  #2 severe symptomatic anemia: Secondary to GI bleed.  Patient's hemoglobin was 11.2 just 2 weeks ago.  She is guaiac positive.  Will transfuse initial 2 units of packed red blood cells.  Follow H&H closely.  Treat for the GI bleed.  #3 GI bleed: Probably due to acute colitis.  GI consulted to see patient.  Initiate IV Protonix .  Serial H&H.  #4 acute colitis: Appears to be descending colon involvement.  Initiate IV antibiotics with Rocephin  and Flagyl .  Other supportive care.  #5 pneumonia: Initiate Rocephin  and Zithromax .  Patient was recently in the hospital and she is from a facility.  May need broader coverage for HCAP.  #6 influenza A infection: Initiate Tamiflu .  Other supportive care.  #7 essential hypertension: Home regimen when stable.  #8 AKI: Most likely prerenal.  Will hydrate  #9 hyponatremia: Secondary to dehydration.  Hydrate and monitor  #10 pulmonary embolism: Appears to be chronic.  Patient not a candidate for anticoagulation at this point.  If family still need aggressive care she may be IVC filter candidate.  #11 GERD: Continue with PPIs  #12 ethics: Patient will likely need palliative care consult.  At this point family want full code.    Advance Care Planning:   Code Status: Full Code   Consults: None but discussed with critical care Dr. Maree.  Will intervene if patient deteriorates  Family Communication: Family members over the phone  Severity of Illness: The appropriate patient status for this  patient is INPATIENT. Inpatient status is judged to be reasonable and necessary in order to provide the required intensity of service to ensure the patient's safety. The patient's presenting symptoms, physical exam findings, and initial radiographic and laboratory data in the context of their chronic comorbidities is felt to place them at high risk for further clinical deterioration. Furthermore, it is not  anticipated that the patient will be medically stable for discharge from the hospital within 2 midnights of admission.   * I certify that at the point of admission it is my clinical judgment that the patient will require inpatient hospital care spanning beyond 2 midnights from the point of admission due to high intensity of service, high risk for further deterioration and high frequency of surveillance required.*  Critical care time spent 92 minutes  Author: SIM KNOLL, MD 08/12/2024 9:18 PM  For on call review [email or web address].      [1]  Allergies Allergen Reactions   Avapro  [Irbesartan ] Other (See Comments)    Difficulty breathing, feeling like she was going to pass out   Calcium Channel Blockers     ineffective   Decongestant [Pseudoephedrine] Other (See Comments)    Heart races , blood pressure goes up   Norvasc [Amlodipine Besylate] Itching, Other (See Comments) and Hypertension    Turned skin red   Sulfa Antibiotics Other (See Comments)    Made kidney infection worse   Zofran  [Ondansetron ] Diarrhea    Only ODT tablets cause this problem.    "

## 2024-08-12 NOTE — Progress Notes (Signed)
 RT assist with pt transport to CT, RN at bedside.

## 2024-08-12 NOTE — Progress Notes (Signed)
" °   08/12/24 2207  Respiratory Assessment  Assessment Type Assess only  Respiratory Pattern Regular;Unlabored  Chest Assessment Chest expansion symmetrical  Cough Non-productive  Bilateral Breath Sounds Clear;Diminished  Oxygen Therapy/Pulse Ox  O2 Device HHFNC  O2 Therapy Oxygen humidified  O2 Flow Rate (L/min) (S)  40 L/min  FiO2 (%) (S)  40 %  Equipment wiped down Yes    "

## 2024-08-12 NOTE — ED Notes (Signed)
 Assumed care on patient , 1st unit PRBC infusing with no adverse effect , waiting for admitting MD , IV sites unremarkable .

## 2024-08-13 ENCOUNTER — Inpatient Hospital Stay (HOSPITAL_COMMUNITY)

## 2024-08-13 DIAGNOSIS — Z86711 Personal history of pulmonary embolism: Secondary | ICD-10-CM

## 2024-08-13 DIAGNOSIS — J9621 Acute and chronic respiratory failure with hypoxia: Secondary | ICD-10-CM

## 2024-08-13 LAB — CBC
HCT: 23.4 % — ABNORMAL LOW (ref 36.0–46.0)
HCT: 23.7 % — ABNORMAL LOW (ref 36.0–46.0)
HCT: 23.9 % — ABNORMAL LOW (ref 36.0–46.0)
HCT: 25.2 % — ABNORMAL LOW (ref 36.0–46.0)
Hemoglobin: 7.9 g/dL — ABNORMAL LOW (ref 12.0–15.0)
Hemoglobin: 8 g/dL — ABNORMAL LOW (ref 12.0–15.0)
Hemoglobin: 8.1 g/dL — ABNORMAL LOW (ref 12.0–15.0)
Hemoglobin: 8.6 g/dL — ABNORMAL LOW (ref 12.0–15.0)
MCH: 30.6 pg (ref 26.0–34.0)
MCH: 30.9 pg (ref 26.0–34.0)
MCH: 31.2 pg (ref 26.0–34.0)
MCH: 31.2 pg (ref 26.0–34.0)
MCHC: 33.5 g/dL (ref 30.0–36.0)
MCHC: 33.8 g/dL (ref 30.0–36.0)
MCHC: 34.1 g/dL (ref 30.0–36.0)
MCHC: 34.2 g/dL (ref 30.0–36.0)
MCV: 90.7 fL (ref 80.0–100.0)
MCV: 91.2 fL (ref 80.0–100.0)
MCV: 91.3 fL (ref 80.0–100.0)
MCV: 92.3 fL (ref 80.0–100.0)
Platelets: 292 K/uL (ref 150–400)
Platelets: 304 K/uL (ref 150–400)
Platelets: 305 K/uL (ref 150–400)
Platelets: 315 K/uL (ref 150–400)
RBC: 2.58 MIL/uL — ABNORMAL LOW (ref 3.87–5.11)
RBC: 2.59 MIL/uL — ABNORMAL LOW (ref 3.87–5.11)
RBC: 2.6 MIL/uL — ABNORMAL LOW (ref 3.87–5.11)
RBC: 2.76 MIL/uL — ABNORMAL LOW (ref 3.87–5.11)
RDW: 15.3 % (ref 11.5–15.5)
RDW: 16.3 % — ABNORMAL HIGH (ref 11.5–15.5)
RDW: 17.1 % — ABNORMAL HIGH (ref 11.5–15.5)
RDW: 17.6 % — ABNORMAL HIGH (ref 11.5–15.5)
WBC: 10 K/uL (ref 4.0–10.5)
WBC: 10.2 K/uL (ref 4.0–10.5)
WBC: 10.5 K/uL (ref 4.0–10.5)
WBC: 8.3 K/uL (ref 4.0–10.5)
nRBC: 4.6 % — ABNORMAL HIGH (ref 0.0–0.2)
nRBC: 4.8 % — ABNORMAL HIGH (ref 0.0–0.2)
nRBC: 5.2 % — ABNORMAL HIGH (ref 0.0–0.2)
nRBC: 7.8 % — ABNORMAL HIGH (ref 0.0–0.2)

## 2024-08-13 LAB — TYPE AND SCREEN
ABO/RH(D): AB NEG
Antibody Screen: NEGATIVE
Unit division: 0
Unit division: 0

## 2024-08-13 LAB — BPAM RBC
Blood Product Expiration Date: 202601172359
Blood Product Expiration Date: 202601192359
ISSUE DATE / TIME: 202512261807
ISSUE DATE / TIME: 202512262037
Unit Type and Rh: 600
Unit Type and Rh: 600

## 2024-08-13 LAB — COMPREHENSIVE METABOLIC PANEL WITH GFR
ALT: 29 U/L (ref 0–44)
AST: 32 U/L (ref 15–41)
Albumin: 3 g/dL — ABNORMAL LOW (ref 3.5–5.0)
Alkaline Phosphatase: 42 U/L (ref 38–126)
Anion gap: 9 (ref 5–15)
BUN: 34 mg/dL — ABNORMAL HIGH (ref 8–23)
CO2: 22 mmol/L (ref 22–32)
Calcium: 8.7 mg/dL — ABNORMAL LOW (ref 8.9–10.3)
Chloride: 104 mmol/L (ref 98–111)
Creatinine, Ser: 1.18 mg/dL — ABNORMAL HIGH (ref 0.44–1.00)
GFR, Estimated: 46 mL/min — ABNORMAL LOW
Glucose, Bld: 125 mg/dL — ABNORMAL HIGH (ref 70–99)
Potassium: 4.3 mmol/L (ref 3.5–5.1)
Sodium: 134 mmol/L — ABNORMAL LOW (ref 135–145)
Total Bilirubin: 0.3 mg/dL (ref 0.0–1.2)
Total Protein: 4.8 g/dL — ABNORMAL LOW (ref 6.5–8.1)

## 2024-08-13 LAB — MRSA NEXT GEN BY PCR, NASAL: MRSA by PCR Next Gen: NOT DETECTED

## 2024-08-13 LAB — TSH: TSH: 0.938 u[IU]/mL (ref 0.350–4.500)

## 2024-08-13 LAB — GLUCOSE, CAPILLARY
Glucose-Capillary: 107 mg/dL — ABNORMAL HIGH (ref 70–99)
Glucose-Capillary: 123 mg/dL — ABNORMAL HIGH (ref 70–99)
Glucose-Capillary: 131 mg/dL — ABNORMAL HIGH (ref 70–99)
Glucose-Capillary: 153 mg/dL — ABNORMAL HIGH (ref 70–99)
Glucose-Capillary: 190 mg/dL — ABNORMAL HIGH (ref 70–99)
Glucose-Capillary: 226 mg/dL — ABNORMAL HIGH (ref 70–99)

## 2024-08-13 MED ORDER — LABETALOL HCL 5 MG/ML IV SOLN
10.0000 mg | INTRAVENOUS | Status: AC | PRN
  Administered 2024-08-13 – 2024-08-14 (×3): 10 mg via INTRAVENOUS
  Filled 2024-08-13 (×2): qty 4

## 2024-08-13 MED ORDER — HYDRALAZINE HCL 20 MG/ML IJ SOLN
10.0000 mg | INTRAMUSCULAR | Status: DC | PRN
  Administered 2024-08-13 – 2024-08-16 (×9): 10 mg via INTRAVENOUS
  Filled 2024-08-13 (×8): qty 1

## 2024-08-13 MED ORDER — LORAZEPAM 2 MG/ML IJ SOLN
0.5000 mg | Freq: Every day | INTRAMUSCULAR | Status: DC
  Administered 2024-08-13 – 2024-08-14 (×2): 0.5 mg via INTRAVENOUS
  Filled 2024-08-13 (×2): qty 1

## 2024-08-13 MED ORDER — DEXTROSE IN LACTATED RINGERS 5 % IV SOLN: INTRAVENOUS | Status: AC

## 2024-08-13 MED ORDER — ALBUTEROL SULFATE (2.5 MG/3ML) 0.083% IN NEBU
2.5000 mg | INHALATION_SOLUTION | RESPIRATORY_TRACT | Status: DC | PRN
  Administered 2024-08-13: 2.5 mg via RESPIRATORY_TRACT

## 2024-08-13 NOTE — Progress Notes (Signed)
 Patient is alert, and confused. Mouth breathing. Attempted mouth care and instructions on deep breathing without success.    Patient attempted removing HFNC, restraints in place. Mittens provided to promote safety, maintain oxygenation.

## 2024-08-13 NOTE — Progress Notes (Signed)
" °   08/13/24 1233  Vitals  Temp 99.1 F (37.3 C)  Pulse Rate 91  ECG Heart Rate 94  Resp (!) 21  Oxygen Therapy  SpO2 (!) 83 %  MEWS Score  MEWS Temp 0  MEWS Systolic 0  MEWS Pulse 0  MEWS RR 1  MEWS LOC 0  MEWS Score 1  MEWS Score Color Green     Patient removed HFNC, dropped into low 80's.   Continues confused, impulsive.  "

## 2024-08-13 NOTE — Progress Notes (Signed)
 eLink Physician-Brief Progress Note Patient Name: Monica Faulkner DOB: 1940/09/05 MRN: [id number]   Date of Service  08/13/2024  HPI/Events of Note  Received request for renewal of restraints Patient seen on camera and is a risk for self harm by pulling lines and tubes  eICU Interventions  Bilateral soft wrist restraints renewed Bedside team to assess in am if restraints to be continued     Intervention Category Intermediate Interventions: Other:  Damien ONEIDA Grout 08/13/2024, 10:36 PM

## 2024-08-13 NOTE — Plan of Care (Signed)
" °  Problem: Clinical Measurements: Goal: Ability to maintain clinical measurements within normal limits will improve Outcome: Progressing Goal: Diagnostic test results will improve Outcome: Progressing Goal: Respiratory complications will improve Outcome: Progressing Goal: Cardiovascular complication will be avoided Outcome: Progressing   Problem: Nutrition: Goal: Adequate nutrition will be maintained Outcome: Progressing   Problem: Safety: Goal: Ability to remain free from injury will improve Outcome: Progressing   Problem: Skin Integrity: Goal: Risk for impaired skin integrity will decrease Outcome: Progressing   Problem: Education: Goal: Knowledge of General Education information will improve Description: Including pain rating scale, medication(s)/side effects and non-pharmacologic comfort measures Outcome: Not Progressing   "

## 2024-08-13 NOTE — Progress Notes (Signed)
 VASCULAR LAB    Bilateral lower extremity venous duplex has been performed.  See CV proc for preliminary results.   Josph Norfleet, RVT 08/13/2024, 10:21 AM

## 2024-08-13 NOTE — Progress Notes (Signed)
 eLink Physician-Brief Progress Note Patient Name: Monica Faulkner DOB: 28-Sep-1940 MRN: [id number]   Date of Service  08/13/2024  HPI/Events of Note  BP 184/62 (93)  HR 82 Already given PRN hydralazine  without help. Unable to give again.  Seen asleep RN reports patient not in pain  eICU Interventions  Labetalol  10 mg IV prn ordered for SBP > 180 Discussed with BSRN     Intervention Category Intermediate Interventions: Hypertension - evaluation and management  Damien ONEIDA Grout 08/13/2024, 9:26 PM

## 2024-08-13 NOTE — Plan of Care (Signed)

## 2024-08-13 NOTE — Evaluation (Signed)
 Clinical/Bedside Swallow Evaluation Patient Details  Name: Monica Faulkner MRN: [id number] Date of Birth: 15-Jun-1941  Today's Date: 08/13/2024 Time: SLP Start Time (ACUTE ONLY): 1645 SLP Stop Time (ACUTE ONLY): 1708 SLP Time Calculation (min) (ACUTE ONLY): 23 min  Past Medical History:  Past Medical History:  Diagnosis Date   Anxiety    seen at [facility], for extreme anxiety. As a result of her anxiety  she had ECHO   Cancer (HCC)    basal cell    Complication of anesthesia    atropine  increased BP & increased pulse   Depression    Diabetes mellitus    type 2- 2006, managed with diet   GERD (gastroesophageal reflux disease)    rare occurence   H/O hiatal hernia    Headache(784.0)    caused by sinus congestion fr. time to time    Hypertension    Past Surgical History:  Past Surgical History:  Procedure Laterality Date   ABDOMINAL HYSTERECTOMY     1991-scar later had to be further repaired    APPENDECTOMY     L ovarian cyst - ruptured & appendectomy done at same time.    EYE SURGERY     R eye- macular hole repair- 2008   GAS INSERTION  03/18/2012   Procedure: INSERTION OF GAS;  Surgeon: Norleen JONETTA Ku, MD;  Location: [facility] OR;  Service: Ophthalmology;  Laterality: Left;   HERNIA REPAIR     1974- inguinal repair   PARS PLANA VITRECTOMY  03/18/2012   Procedure: PARS PLANA VITRECTOMY WITH 25 GAUGE;  Surgeon: Norleen JONETTA Ku, MD;  Location: [facility] OR;  Service: Ophthalmology;  Laterality: Left;  25 GAUGE PPV FOR MACULAR HOLE   TEAR DUCT PROBING     as an infant   TONSILLECTOMY     and adenioidectomy- as a child   VITRECTOMY  03/18/2012   HPI:  Monica Faulkner is an 83 y.o. female who presented to the hospital from SNF on 08/12/24 with diagnosis of flu and PNA. She was recently admitted 12/5-12/15/25 with acute metabolic encephalopathy and severe dehydration with AKI, lactic acidosis as well and sent to SNF upon discharge. For current admission, she developed severe respiratory distress,  placed on NRB for SpO2 90%; she was barely arousable; hemoglobin was 4.5 (was 13.6 at discharge two weeks prior). CT angio/chest/PE showed Superimposed consolidation  within the peripheral right lower lobe may reflect changes of acute infection or aspiration. CXR showed new right basilar airspace opacity; suspicious for PNA. She was advanced from NRB to [facility] on 45L and was admitted with acute metabolic encephalopathy, multifactorial, influenza PNA, acute GI bleed, subacute PE. PMH: GERD, DM-2, HTN, anxiety disorder, depression.    Assessment / Plan / Recommendation  Clinical Impression  Plan: SLP is recommending PO diet of full liquids (thin consistency) with full, close supervision by nursing. SLP will follow for toleration.   Patient did not exhibit any overt s/s aspiration with PO intake of consecutive straw sips of thin liquids (water ), ice chips, puree (applesauce). Swallow initiation appeared timely. At rest, SpO2 in low 90% but did briefly drop to 87% when HHFNC was malpositioned while SLP removing surgical facemask (in place to keep HHFNC positioned). SLP able to successfully feed patient by lifting up bottom of mask, which did not result in drop in SpO2. MD and RN both informed of recommendations and both in agreement.   SLP Visit Diagnosis: Dysphagia, unspecified (R13.10)    Aspiration Risk  Diet Recommendation Thin liquid;Other (Comment) (full liquids)    Liquid Administration via: Straw;Cup Medication Administration: Crushed with puree Supervision: Full supervision/cueing for compensatory strategies;Staff to assist with self feeding Compensations: Slow rate;Small sips/bites    Other Recommendations Oral Care Recommendations: Oral care BID;Oral care before and after PO;Staff/trained caregiver to provide oral care     Swallow Evaluation Recommendations     Assistance Recommended at Discharge    Functional Status Assessment Patient has had a recent decline in their functional  status and demonstrates the ability to make significant improvements in function in a reasonable and predictable amount of time.  Frequency and Duration min 2x/week  1 week       Prognosis Prognosis for improved oropharyngeal function: Fair Barriers to Reach Goals: Other (Comment) (medical prognosis)      Swallow Study   General Date of Onset: 08/13/24 HPI: Monica Faulkner is an 83 y.o. female who presented to the hospital from SNF on 08/12/24 with diagnosis of flu and PNA. She was recently admitted 12/5-12/15/25 with acute metabolic encephalopathy and severe dehydration with AKI, lactic acidosis as well and sent to SNF upon discharge. For current admission, she developed severe respiratory distress, placed on NRB for SpO2 90%; she was barely arousable; hemoglobin was 4.5 (was 13.6 at discharge two weeks prior). CT angio/chest/PE showed Superimposed consolidation  within the peripheral right lower lobe may reflect changes of acute infection or aspiration. CXR showed new right basilar airspace opacity; suspicious for PNA. She was advanced from NRB to [facility] on 45L and was admitted with acute metabolic encephalopathy, multifactorial, influenza PNA, acute GI bleed, subacute PE. PMH: GERD, DM-2, HTN, anxiety disorder, depression. Type of Study: Bedside Swallow Evaluation Previous Swallow Assessment: none found Diet Prior to this Study: NPO Temperature Spikes Noted: Yes (99.5) Respiratory Status: Nasal cannula (HHFNC) History of Recent Intubation: No Behavior/Cognition: Alert;Cooperative;Confused Oral Cavity Assessment: Dry Oral Care Completed by SLP: Yes Oral Cavity - Dentition: Adequate natural dentition Self-Feeding Abilities: Total assist Patient Positioning: Upright in bed Baseline Vocal Quality: Normal Volitional Cough: Cognitively unable to elicit Volitional Swallow: Unable to elicit    Oral/Motor/Sensory Function Overall Oral Motor/Sensory Function: Within functional limits   Ice Chips  Ice chips: Within functional limits Presentation: Spoon   Thin Liquid Thin Liquid: Within functional limits Presentation: Straw    Nectar Thick     Honey Thick     Puree Puree: Within functional limits Presentation: Spoon   Solid     Solid: Not tested     Norleen IVAR Blase, MA, CCC-SLP Speech Therapy  08/13/2024,5:52 PM

## 2024-08-13 NOTE — Progress Notes (Signed)
 " PROGRESS NOTE    Monica Faulkner  FMW:[id number] DOB: [birth year] DOA: 08/12/2024 PCP: Verdia Lombard, MD   Brief Narrative:  Monica Faulkner is a 83 y.o. female with medical history significant of basal cell carcinoma, type 2 diabetes, GERD, essential hypertension, anxiety disorder, depression.  Patient presents to our facility on 08/12/2024 with diagnosis from rehab with the flu and pneumonia with profound hypoxia and respiratory distress.  Patient initially requiring nonrebreather and heated high flow to maintain sats above 90%.  Patient found to have profound anemia at 4.5 -FOBT positive but dark brown stool noted on exam.  She also has notable subacute PE and age-indeterminate DVT of the left peroneal vein complicating her symptoms.  CT also notable for colitis which is likely the origin of her anemia.  Patient was just discharged from our facility on 12/15 in the setting of profound AKI, lactic acidosis and dehydration with metabolic encephalopathy.  Discharged to skilled facility at that time, 11 days later she returned with complaints as outlined above.  Assessment & Plan:   Principal Problem:   Acute on chronic hypoxic respiratory failure (HCC) Active Problems:   Hyponatremia   Acute metabolic encephalopathy   Controlled type 2 diabetes mellitus without complication, without long-term current use of insulin  (HCC)   Essential hypertension   GERD (gastroesophageal reflux disease)   GI bleed   Symptomatic anemia   Acute colitis   Acute pulmonary embolism (HCC)   Influenza A with pneumonia    Acute metabolic encephalopathy, multifactorial, POA  Rule out withdrawal/toxic encephalopathy - At intake patient was noted to be lethargic and poorly arousable, now awake alert oriented to person place and situation - likely at her baseline although she still has moments of confusion likely related to her hypoxia/anemia/infection as below - Continue supplemental treatment as  below, avoid CNS depressants, delirium precautions in place - Polypharmacy unlikely although patient is on benzodiazepines low-dose daily she does not appear to be in withdrawals, will continue at this time to avoid precipitation of withdrawals given her tenuous status  Acute hypoxic respiratory failure Influenza pneumonia Bibasilar pneumonia, rule out bacterial component Subactute PE Age indeterminant DVT, L peroneal vein - Patient's respiratory status is multifactorial in the setting of subacute PE, influenza pneumonia with concern over possible underlying bacterial pneumonia as well -continue Tamiflu  -Continue azithromycin , ceftriaxone  for presumed pneumonia - Patient continues to require moderately high volumes of oxygen to maintain sats above 90% - Patient's baseline oxygen requirements are room air - Patient currently requiring heated high flow at 90% 45 L/min  - Oxygen supplementation is complicated by her mental status as she continues to remove oxygen when not being monitored, wrist restraints have been discussed if patient continues to be noncompliant with treatment for her own safety - At this time patient is unable to initiate anticoagulation in the setting of presumed GI bleed    Severe acute symptomatic anemia Acute GI bleed Acute colitis - Discussed case with GI, given her tenuous status there is no indication for consultation at this time as she would not be appropriate or stable for endoscopy. Will call GI back (or call IR) if patient stabilizes and notable source for intervention is identified. - Status post 2 unit PRBC -hemoglobin corrected to 8.6 (baseline 11) - Continue metronidazole  for presumed colitis treatment (on azithromycin  and ceftriaxone  as above for pneumonia)   Essential hypertension: Holding home p.o. medications until mental status stabilizes, as needed hydralazine  ongoing -increased to every 4 hours PRN  AKI: Likely prerenal, downtrending appropriately with  supportive care  Subacute pulmonary embolism: Appears to be chronic/subacute per imaging -patient not a candidate for anticoagulation at this time given acute anemia and GI bleed as above -likely not a candidate for IVC filter given acute illness/instability and PE already established/subacute versus chronic.  If patient stabilizes and is still unable to tolerate anticoagulation would consider IVC filter given DVT findings but again at this point risk outweighs any benefits.   GERD: Continue with PPIs   Goals of care -patient's emergency contact/POA brother is unavailable, initially his number was incorrect in our system but even with a correct number he has been unreachable for most of the day despite multiple calls.  Unclear what our goals of care are moving forward.  Given patient's previous quality of life it is not unreasonable to continue aggressive care at this time; however, if she continues to deteriorate in the setting of pulmonary embolism and DVT that cannot be treated due to GI bleed we would need to discuss possible de-escalation of care as neither intubation or CPR would benefit the patient at this time given chronicity of her embolism and profound pneumonia.  DVT prophylaxis: SCDs Start: 08/12/24 2331 no chemical prophylaxis in the setting of GI bleed Code Status:   Code Status: Full Code **would recommend CODE STATUS discussion once patient's family is able to be reached**patient currently unable to discuss complex medical decisions given her mental status Family Communication: Attempted to call emergency contact (Brother [name]) multiple times with no answer - this is after his number was updated/corrected by the team.  Status is: Inpatient  Dispo: The patient is from: SNF              Anticipated d/c is to: To be determined              Anticipated d/c date is: To be determined              Patient currently not medically stable for discharge  Consultants:  PCCM GI(discussed  there is currently no indication for intervention and would reconsult them if patient's symptoms improved)  Procedures:  None  Antimicrobials:  Azithromycin , ceftriaxone , metronidazole   Subjective: Overnight patient continues to improve in both symptoms and hypoxia, patient awake alert and oriented to person and place this morning, continues to have episodes of confusion, reports her respiratory status is way better but otherwise feels quite poor and short of breath.  She denies any overt pain nausea vomiting diarrhea or constipation.  Objective: Vitals:   08/13/24 0545 08/13/24 0600 08/13/24 0615 08/13/24 0630  BP: (!) 166/72 (!) 165/109 (!) 170/68 (!) 175/71  Pulse: 71 72 76 70  Resp: 20 (!) 22 18 18   Temp: 99 F (37.2 C) 99 F (37.2 C) 99 F (37.2 C) 99 F (37.2 C)  TempSrc:      SpO2: (!) 89% (!) 80% (!) 89% 90%  Weight:      Height:        Intake/Output Summary (Last 24 hours) at 08/13/2024 9297 Last data filed at 08/13/2024 0600 Gross per 24 hour  Intake 1176.93 ml  Output 1100 ml  Net 76.93 ml   Filed Weights   08/12/24 1559  Weight: 60.5 kg    Examination:  General: No acute distress but uncomfortable on heated high flow HEENT:  Normocephalic atraumatic.  Sclerae nonicteric, noninjected.  Extraocular movements intact bilaterally. Lungs: Profoundly diminished diffusely with marked bilateral rhonchi Heart: Tachycardic regular rhythm without overt  murmurs. Abdomen:  Soft, nontender, nondistended.  Without guarding or rebound. Extremities: Without cyanosis, or clubbing  Data Reviewed: I have personally reviewed following labs and imaging studies  CBC: Recent Labs  Lab 08/12/24 1618 08/13/24 0023  WBC 8.8 10.0  NEUTROABS 7.1  --   HGB 4.5* 8.6*  HCT 14.1* 25.2*  MCV 102.2* 91.3  PLT 371 304   Basic Metabolic Panel: Recent Labs  Lab 08/12/24 1618  NA 133*  K 4.8  CL 102  CO2 23  GLUCOSE 148*  BUN 45*  CREATININE 1.33*  CALCIUM 8.9  MG 1.9    GFR: Estimated Creatinine Clearance: 29.3 mL/min (A) (by C-G formula based on SCr of 1.33 mg/dL (H)).  Liver Function Tests: Recent Labs  Lab 08/12/24 1618  AST 28  ALT 29  ALKPHOS 35*  BILITOT 0.4  PROT 5.0*  ALBUMIN 3.2*   BNP (last 3 results) Recent Labs    08/12/24 1618  PROBNP 5,942.0*   CBG: Recent Labs  Lab 08/12/24 1611 08/13/24 0002 08/13/24 0335  GLUCAP 148* 190* 153*   Thyroid  Function Tests: Recent Labs    08/13/24 0023  TSH 0.938   Sepsis Labs: Recent Labs  Lab 08/12/24 1617  LATICACIDVEN 1.1   Recent Results (from the past 240 hours)  Resp panel by RT-PCR (RSV, Flu A&B, Covid) Anterior Nasal Swab     Status: Abnormal   Collection Time: 08/12/24  4:42 PM   Specimen: Anterior Nasal Swab  Result Value Ref Range Status   SARS Coronavirus 2 by RT PCR NEGATIVE NEGATIVE Final   Influenza A by PCR POSITIVE (A) NEGATIVE Final   Influenza B by PCR NEGATIVE NEGATIVE Final    Comment: (NOTE) The Xpert Xpress SARS-CoV-2/FLU/RSV plus assay is intended as an aid in the diagnosis of influenza from Nasopharyngeal swab specimens and should not be used as a sole basis for treatment. Nasal washings and aspirates are unacceptable for Xpert Xpress SARS-CoV-2/FLU/RSV testing.  Fact Sheet for Patients: bloggercourse.com  Fact Sheet for Healthcare Providers: seriousbroker.it  This test is not yet approved or cleared by the [place]  FDA and has been authorized for detection and/or diagnosis of SARS-CoV-2 by FDA under an Emergency Use Authorization (EUA). This EUA will remain in effect (meaning this test can be used) for the duration of the COVID-19 declaration under Section 564(b)(1) of the Act, 21 U.S.C. section 360bbb-3(b)(1), unless the authorization is terminated or revoked.     Resp Syncytial Virus by PCR NEGATIVE NEGATIVE Final    Comment: (NOTE) Fact Sheet for  Patients: bloggercourse.com  Fact Sheet for Healthcare Providers: seriousbroker.it  This test is not yet approved or cleared by the [place]  FDA and has been authorized for detection and/or diagnosis of SARS-CoV-2 by FDA under an Emergency Use Authorization (EUA). This EUA will remain in effect (meaning this test can be used) for the duration of the COVID-19 declaration under Section 564(b)(1) of the Act, 21 U.S.C. section 360bbb-3(b)(1), unless the authorization is terminated or revoked.  Performed at [facility], [address], [place], [place] [address]   MRSA Next Gen by PCR, Nasal     Status: None   Collection Time: 08/12/24 11:21 PM   Specimen: Nasal Mucosa; Nasal Swab  Result Value Ref Range Status   MRSA by PCR Next Gen NOT DETECTED NOT DETECTED Final    Comment: (NOTE) The GeneXpert MRSA Assay (FDA approved for NASAL specimens only), is one component of a comprehensive MRSA colonization surveillance program.  It is not intended to diagnose MRSA infection nor to guide or monitor treatment for MRSA infections. Test performance is not FDA approved in patients less than 34 years old. Performed at [facility], [address] [place], [place] [address]     Radiology Studies: CT Angio Chest/Abd/Pel for Dissection W and/or Wo Contrast Result Date: 08/12/2024 EXAM: CTA CHEST, ABDOMEN AND PELVIS WITH AND WITHOUT CONTRAST 08/12/2024 07:46:15 PM TECHNIQUE: CTA of the chest was performed with and without the administration of intravenous contrast. CTA of the abdomen and pelvis was performed with and without the administration of intravenous contrast. 100 mL of iohexol  (OMNIPAQUE ) 350 MG/ML injection was administered. Multiplanar reformatted images are provided for review. MIP images are provided for review. Automated exposure control, iterative reconstruction, and/or weight based adjustment of the mA/kV was utilized  to reduce the radiation dose to as low as reasonably achievable. COMPARISON: 07/22/2024 CLINICAL HISTORY: acute blood loss anemia, pneumonia, gastrointestinal hemorrhage FINDINGS: VASCULATURE: AORTA: Moderate atherosclerotic calcification within the thoracic aorta. No aortic aneurysm. No dissection. No abdominal aortic aneurysm. PULMONARY ARTERIES: Intraluminal filling defect within the right upper lobar pulmonary artery centrally (65/7) in keeping with a pulmonary embolus, likely subacute to chronic in nature given its somewhat irregular contour and mural eccentricity. Central pulmonary arteries are of normal caliber. GREAT VESSELS OF AORTIC ARCH: No acute finding. No dissection. No arterial occlusion or significant stenosis. 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 Superior mesenteric artery and inferior mesenteric arteries are widely patent at their origin making this a questionable clinical significance. SUPERIOR MESENTERIC ARTERY: Widely patent at its origin. No occlusion or significant stenosis. INFERIOR MESENTERIC ARTERY: Widely patent. No occlusion or significant stenosis. RENAL ARTERIES: No acute finding. No occlusion or significant stenosis. ILIAC ARTERIES: Moderate aortoiliac atherosclerotic calcification. No occlusion or significant stenosis. CHEST: MEDIASTINUM: Mild coronary artery calcification. Global cardiac size within normal limits. No CT evidence of right heart strain. No pericardial effusion. No mediastinal lymphadenopathy. LUNGS AND PLEURA: Mild interstitial pulmonary edema. Small right and trace left pleural effusions with associated bibasilar compressive atelectasis. Superimposed consolidation within the peripheral right lower lobe may reflect changes of acute infection or aspiration. No pneumothorax. THORACIC BONES AND SOFT TISSUES: No acute bone or soft tissue abnormality. ABDOMEN AND PELVIS: LIVER: The liver is unremarkable. GALLBLADDER AND BILE  DUCTS: Cholelithiasis without superimposed pericholecystic inflammatory change. No intra or extrahepatic biliary ductal dilation. SPLEEN: The spleen is unremarkable. PANCREAS: The pancreas is unremarkable. ADRENAL GLANDS: Bilateral adrenal glands demonstrate no acute abnormality. KIDNEYS, URETERS AND BLADDER: Foley catheter balloon seen within a decompressed bladder lumen. No stones in the kidneys or ureters. No hydronephrosis. No perinephric or periureteral stranding. GI AND BOWEL: Moderate sigmoid diverticulosis. There is circumferential mild bowel wall thickening and pericolonic inflammatory stranding involving the descending colon which may reflect changes of a infectious, inflammatory, or potentially ischemic colitis. The inferior mesenteric, sigmoid, and left colic artery are widely patent, however, making the latter consideration less likely. Note that the examination is limited as venous phase imaging was not performed. The stomach, small bowel, and large bowel are otherwise unremarkable. There is no bowel obstruction. No abnormal bowel wall thickening or distension. REPRODUCTIVE: Reproductive organs are unremarkable. PERITONEUM AND RETROPERITONEUM: No ascites or free air. LYMPH NODES: No lymphadenopathy. ABDOMINAL BONES AND SOFT TISSUES: Severe thoracolumbar sigmoid scoliosis. Osseous structures are age appropriate. No acute bone abnormality. No lytic or blastic bone lesion. No acute soft tissue abnormality. IMPRESSION: 1. Circumferential mild  bowel wall thickening and pericolonic inflammatory stranding involving the descending colon, most consistent with colitis; ischemic colitis is considered less likely, though evaluation is limited without venous phase imaging. 2. Peripheral right lower lobe consolidation, which may reflect infection or aspiration. 3. Pulmonary embolus in the central right upper lobar pulmonary artery, likely subacute to chronic in nature, without CT evidence of right heart strain. 4.  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 5. Chronic findings include coronary artery calcification and systemic atherosclerosis, cholelithiasis, sigmoid diverticulosis, and thoracolumbar scoliosis. Electronically signed by: Dorethia Molt MD 08/12/2024 08:12 PM EST RP Workstation: [id number]   DG Chest Portable 1 View Result Date: 08/12/2024 EXAM: 1 VIEW(S) XRAY OF THE CHEST 08/12/2024 04:39:00 PM COMPARISON: 07/23/2024 CLINICAL HISTORY: sob FINDINGS: LUNGS AND PLEURA: New right basilar airspace opacity. No pleural effusion. No pneumothorax. HEART AND MEDIASTINUM: Tortuous thoracic aorta. Atherosclerotic calcifications. BONES AND SOFT TISSUES: Right convex thoracic scoliosis. IMPRESSION: 1. New right basilar airspace opacity, suspicious for pneumonia. 2. Tortuous thoracic aorta with atherosclerotic calcifications. Electronically signed by: Greig Pique MD 08/12/2024 05:44 PM EST RP Workstation: [id number]    Scheduled Meds:  sodium chloride    Intravenous Once   Chlorhexidine  Gluconate Cloth  6 each Topical Daily   insulin  aspart  0-5 Units Subcutaneous QHS   insulin  aspart  0-9 Units Subcutaneous TID WC   oseltamivir   30 mg Oral Daily   pantoprazole  (PROTONIX ) IV  40 mg Intravenous Q12H   Continuous Infusions:  azithromycin  Stopped (08/13/24 0100)   cefTRIAXone  (ROCEPHIN )  IV Stopped (08/13/24 0049)   metronidazole  Stopped (08/13/24 0131)     LOS: 1 day   Time spent:   Elsie JAYSON Montclair, DO [facility]  If 7PM-7AM, please contact night-coverage [email or web address]  08/13/2024, 7:02 AM      "

## 2024-08-14 DIAGNOSIS — J9621 Acute and chronic respiratory failure with hypoxia: Secondary | ICD-10-CM | POA: Diagnosis not present

## 2024-08-14 LAB — GLUCOSE, CAPILLARY
Glucose-Capillary: 238 mg/dL — ABNORMAL HIGH (ref 70–99)
Glucose-Capillary: 198 mg/dL — ABNORMAL HIGH (ref 70–99)
Glucose-Capillary: 158 mg/dL — ABNORMAL HIGH (ref 70–99)
Glucose-Capillary: 156 mg/dL — ABNORMAL HIGH (ref 70–99)
Glucose-Capillary: 132 mg/dL — ABNORMAL HIGH (ref 70–99)

## 2024-08-14 LAB — BASIC METABOLIC PANEL WITH GFR
Anion gap: 9 (ref 5–15)
BUN: 19 mg/dL (ref 8–23)
CO2: 23 mmol/L (ref 22–32)
Calcium: 8.3 mg/dL — ABNORMAL LOW (ref 8.9–10.3)
Chloride: 103 mmol/L (ref 98–111)
Creatinine, Ser: 0.92 mg/dL (ref 0.44–1.00)
GFR, Estimated: >60 mL/min
Glucose, Bld: 242 mg/dL — ABNORMAL HIGH (ref 70–99)
Potassium: 3.5 mmol/L (ref 3.5–5.1)
Sodium: 136 mmol/L (ref 135–145)

## 2024-08-14 LAB — URINE CULTURE: Culture: NO GROWTH

## 2024-08-14 LAB — PHOSPHORUS: Phosphorus: 2.9 mg/dL (ref 2.5–4.6)

## 2024-08-14 LAB — CBC
HCT: 23.7 % — ABNORMAL LOW (ref 36.0–46.0)
Hemoglobin: 7.9 g/dL — ABNORMAL LOW (ref 12.0–15.0)
MCH: 30.9 pg (ref 26.0–34.0)
MCHC: 33.3 g/dL (ref 30.0–36.0)
MCV: 92.6 fL (ref 80.0–100.0)
Platelets: 277 K/uL (ref 150–400)
RBC: 2.56 MIL/uL — ABNORMAL LOW (ref 3.87–5.11)
RDW: 17.6 % — ABNORMAL HIGH (ref 11.5–15.5)
WBC: 7.3 K/uL (ref 4.0–10.5)
nRBC: 2.8 % — ABNORMAL HIGH (ref 0.0–0.2)

## 2024-08-14 LAB — MAGNESIUM: Magnesium: 1.7 mg/dL (ref 1.7–2.4)

## 2024-08-14 MED ORDER — DILTIAZEM HCL 25 MG/5ML IV SOLN
15.0000 mg | Freq: Once | INTRAVENOUS | Status: DC
  Filled 2024-08-14: qty 5

## 2024-08-14 MED ORDER — METOPROLOL TARTRATE 5 MG/5ML IV SOLN
5.0000 mg | INTRAVENOUS | Status: AC | PRN
  Administered 2024-08-14 (×2): 5 mg via INTRAVENOUS
  Filled 2024-08-14: qty 5

## 2024-08-14 MED ORDER — MAGNESIUM SULFATE 2 GM/50ML IV SOLN
2.0000 g | Freq: Once | INTRAVENOUS | Status: AC
  Administered 2024-08-14: 2 g via INTRAVENOUS
  Filled 2024-08-14: qty 50

## 2024-08-14 MED ORDER — METOPROLOL TARTRATE 5 MG/5ML IV SOLN: 5.0000 mg | Freq: Once | INTRAVENOUS | Status: AC

## 2024-08-14 MED ORDER — POTASSIUM CHLORIDE CRYS ER 20 MEQ PO TBCR
40.0000 meq | EXTENDED_RELEASE_TABLET | Freq: Once | ORAL | Status: AC
  Administered 2024-08-14: 40 meq via ORAL
  Filled 2024-08-14: qty 2

## 2024-08-14 MED ORDER — METOPROLOL TARTRATE 5 MG/5ML IV SOLN
INTRAVENOUS | Status: AC
  Filled 2024-08-14: qty 5

## 2024-08-14 MED ORDER — METOPROLOL TARTRATE 5 MG/5ML IV SOLN
INTRAVENOUS | Status: AC
  Administered 2024-08-14: 5 mg via INTRAVENOUS
  Filled 2024-08-14: qty 5

## 2024-08-14 NOTE — Progress Notes (Signed)
 Speech Language Pathology Treatment: Dysphagia  Patient Details Name: Monica Faulkner MRN: [id number] DOB: 01/14/1941 Today's Date: 08/14/2024 Time: 9099-9077 SLP Time Calculation (min) (ACUTE ONLY): 22 min  Assessment / Plan / Recommendation Clinical Impression  Plan: Continue full liquids/thin consistency. SLP will continue to follow. Pull bottom of mask up to get to her mouth so as to keep HHFNC from dislodging.  Patient seen by SLP for skilled treatment focused on dysphagia goals. At rest, her SpO2 was 94% (improved from previous date 89-90%), RR was in range of 18-25, at times as high as 29. HR was elevated throughout session 140-160). She took consecutive straw sips of thin liquids (water ) for a total of approximately 5 ounces without overt s/s and without observed changes in vitals.    HPI HPI: Monica Faulkner is an 83 y.o. female who presented to the hospital from SNF on 08/12/24 with diagnosis of flu and PNA. She was recently admitted 12/5-12/15/25 with acute metabolic encephalopathy and severe dehydration with AKI, lactic acidosis as well and sent to SNF upon discharge. For current admission, she developed severe respiratory distress, placed on NRB for SpO2 90%; she was barely arousable; hemoglobin was 4.5 (was 13.6 at discharge two weeks prior). CT angio/chest/PE showed Superimposed consolidation  within the peripheral right lower lobe may reflect changes of acute infection or aspiration. CXR showed new right basilar airspace opacity; suspicious for PNA. She was advanced from NRB to [facility] on 45L and was admitted with acute metabolic encephalopathy, multifactorial, influenza PNA, acute GI bleed, subacute PE. PMH: GERD, DM-2, HTN, anxiety disorder, depression.      SLP Plan  Continue with current plan of care        Swallow Evaluation Recommendations         Recommendations  Diet recommendations: Thin liquid;Other(comment) (full liquids) Liquids provided via: Straw Medication  Administration: Other (Comment) (whole or crushed in puree) Supervision: Full supervision/cueing for compensatory strategies;Staff to assist with self feeding Compensations: Slow rate;Small sips/bites Postural Changes and/or Swallow Maneuvers: Seated upright 90 degrees                  Oral care BID;Oral care before and after PO;Staff/trained caregiver to provide oral care   Frequent or constant Supervision/Assistance Dysphagia, unspecified (R13.10)     Continue with current plan of care     Norleen IVAR Blase, MA, CCC-SLP Speech Therapy   08/14/2024, 9:28 AM

## 2024-08-14 NOTE — Progress Notes (Signed)
 eLink Physician-Brief Progress Note Patient Name: Monica Faulkner DOB: [birth year] MRN: [id number]   Date of Service  08/14/2024  HPI/Events of Note  Converted into A fib RVR in the 140-170's. BP 157/80 (100)  Morphine  given for pain earlier  eICU Interventions  Metoprolol  5 mg IV q 5 min prn for HR > 130 x 3 doses. Hold for SBP < 110 Electrolytes to be checked Discussed with [facility]     Intervention Category Intermediate Interventions: Arrhythmia - evaluation and management  Damien ONEIDA Grout 08/14/2024, 5:54 AM

## 2024-08-14 NOTE — Progress Notes (Addendum)
 " PROGRESS NOTE    Monica Faulkner  FMW:[id number] DOB: [birth year] DOA: 08/12/2024 PCP: Verdia Lombard, MD   Brief Narrative:  Monica Faulkner is a 83 y.o. female with medical history significant of basal cell carcinoma, type 2 diabetes, GERD, essential hypertension, anxiety disorder, depression.  Patient presents to our facility on 08/12/2024 with diagnosis from rehab with the flu and pneumonia with profound hypoxia and respiratory distress.  Patient initially requiring nonrebreather and heated high flow to maintain sats above 90%.  Patient found to have profound anemia at 4.5 -FOBT positive but dark brown stool noted on exam.  She also has notable subacute PE and age-indeterminate DVT of the left peroneal vein complicating her symptoms. CT also notable for colitis which is likely the origin of her anemia.  Patient was just discharged from our facility on 12/15 in the setting of profound AKI, lactic acidosis and dehydration with metabolic encephalopathy. Discharged to skilled facility at that time, 11 days later she returned with complaints as outlined above.  Assessment & Plan:   Principal Problem:   Acute on chronic hypoxic respiratory failure (HCC) Active Problems:   Hyponatremia   Acute metabolic encephalopathy   Controlled type 2 diabetes mellitus without complication, without long-term current use of insulin  (HCC)   Essential hypertension   GERD (gastroesophageal reflux disease)   GI bleed   Symptomatic anemia   Acute colitis   Acute pulmonary embolism (HCC)   Influenza A with pneumonia  Goals of care  - Lengthy discussion today with patient's brother Monica Faulkner who is her default power of attorney as he is her only living relative. - We discussed patient's current status and comorbidities and given her age and current illness we discussed her CODE STATUS including intubation and CPR.  He states after further description that she would not want any of that -and as such we  will transition patient to DO NOT RESUSCITATE CODE STATUS. - At this time the patient does continue to improve somewhat but continues to be tenuous.  He and I discussed that hopefully she continues to improve but discussed the possibility that she may not or she may decline over the next few days at which time we would likely need to discuss further about her care but at this point given her improvement we will continue current treatment regimen and transition patient to a DNR CODE STATUS.  Acute metabolic encephalopathy, multifactorial, POA  Rule out withdrawal/toxic encephalopathy - At intake patient was noted to be lethargic and poorly arousable, mental status continues to wax and wane oriented to person only this morning  - Continue supplemental treatment as below, avoid CNS depressants, delirium precautions in place - Polypharmacy unlikely although patient is on benzodiazepines low-dose daily she does not appear to be in withdrawals, will continue at this time to avoid precipitation of withdrawals given her tenuous status  Acute hypoxic respiratory failure Influenza pneumonia Bibasilar pneumonia, rule out bacterial component Subactute PE Age indeterminant DVT, L peroneal vein - Patient's respiratory status is multifactorial in the setting of subacute PE, influenza pneumonia with concern over possible underlying bacterial pneumonia as well -continue Tamiflu  -Continue azithromycin , ceftriaxone  for presumed pneumonia - Patient continues to require moderately high volumes of oxygen to maintain sats above 90% - Patient's baseline oxygen requirements are room air - Patient currently requiring heated high flow at 75% 45 L/min (down from 90%)  - Oxygen supplementation is complicated by her mental status as she continues to remove oxygen when not being monitored,  wrist restraints have been discussed if patient continues to be noncompliant with treatment for her own safety - At this time patient is  unable to initiate anticoagulation in the setting of presumed GI bleed    Severe acute symptomatic anemia Acute GI bleed Acute colitis - Discussed case with GI, given her tenuous status there is no indication for consultation at this time as she would not be appropriate or stable for endoscopy. Will call GI back (or call IR) if patient stabilizes and notable source for intervention is identified. - Status post 2 unit PRBC -hemoglobin corrected to 8.6 (baseline 11) - Continue metronidazole  for presumed colitis treatment (on azithromycin  and ceftriaxone  as above for pneumonia)   Essential hypertension: Holding home PO medications until mental status stabilizes, as needed hydralazine  ongoing - increased to every 4 hours PRN.  AKI: Likely prerenal, downtrending appropriately with supportive care  Subacute pulmonary embolism: Appears to be chronic/subacute per imaging -patient not a candidate for anticoagulation at this time given acute anemia and GI bleed as above -likely not a candidate for IVC filter given acute illness/instability and PE already established/subacute versus chronic.  If patient stabilizes and is still unable to tolerate anticoagulation would consider IVC filter given DVT findings but again at this point risk outweighs any benefits.   GERD: Continue with PPIs    DVT prophylaxis: SCDs Start: 08/12/24 2331 no chemical prophylaxis in the setting of GI bleed Code Status:   Code Status: Full Code **would recommend CODE STATUS discussion once patient's family is able to be reached**patient currently unable to discuss complex medical decisions given her mental status Family Communication: Attempted to call emergency contact (Brother Monica Faulkner) multiple times with no answer - this is after his number was updated/corrected by the team.  Status is: Inpatient  Dispo: The patient is from: SNF              Anticipated d/c is to: To be determined              Anticipated d/c date is: To be  determined              Patient currently not medically stable for discharge  Consultants:  PCCM GI(discussed there is currently no indication for intervention and would reconsult them if patient's symptoms improved)  Procedures:  None  Antimicrobials:  Azithromycin , ceftriaxone , metronidazole   Subjective: Overnight patient continues to improve in both symptoms and hypoxia, patient awake alert and oriented to person and place this morning, continues to have episodes of confusion, reports her respiratory status is way better but otherwise feels quite poor and short of breath.  She denies any overt pain nausea vomiting diarrhea or constipation.  Objective: Vitals:   08/14/24 0620 08/14/24 0630 08/14/24 0640 08/14/24 0650  BP: 121/87 129/75 129/86   Pulse: (!) 135 (!) 131 (!) 137 (!) 118  Resp: 18 (!) 22 19 (!) 23  Temp: 99.7 F (37.6 C) 99.7 F (37.6 C) 99.7 F (37.6 C) 99.9 F (37.7 C)  TempSrc:      SpO2: 95% 95% 96% 95%  Weight:      Height:        Intake/Output Summary (Last 24 hours) at 08/14/2024 0655 Last data filed at 08/14/2024 0600 Gross per 24 hour  Intake 1389.08 ml  Output 2005 ml  Net -615.92 ml   Filed Weights   08/12/24 1559 08/14/24 0400  Weight: 60.5 kg 71.6 kg    Examination:  General: No acute distress but uncomfortable on  heated high flow HEENT:  Normocephalic atraumatic.  Sclerae nonicteric, noninjected.  Extraocular movements intact bilaterally. Lungs: Profoundly diminished diffusely with marked bilateral rhonchi Heart: Tachycardic regular rhythm without overt murmurs. Abdomen:  Soft, nontender, nondistended.  Without guarding or rebound. Extremities: Without cyanosis, or clubbing  Data Reviewed: I have personally reviewed following labs and imaging studies  CBC: Recent Labs  Lab 08/12/24 1618 08/13/24 0023 08/13/24 0902 08/13/24 1113 08/13/24 1846 08/14/24 0048  WBC 8.8 10.0 10.2 10.5 8.3 7.3  NEUTROABS 7.1  --   --   --   --    --   HGB 4.5* 8.6* 7.9* 8.1* 8.0* 7.9*  HCT 14.1* 25.2* 23.4* 23.7* 23.9* 23.7*  MCV 102.2* 91.3 90.7 91.2 92.3 92.6  PLT 371 304 305 315 292 277   Basic Metabolic Panel: Recent Labs  Lab 08/12/24 1618 08/13/24 0902  NA 133* 134*  K 4.8 4.3  CL 102 104  CO2 23 22  GLUCOSE 148* 125*  BUN 45* 34*  CREATININE 1.33* 1.18*  CALCIUM 8.9 8.7*  MG 1.9  --    GFR: Estimated Creatinine Clearance: 36.4 mL/min (A) (by C-G formula based on SCr of 1.18 mg/dL (H)).  Liver Function Tests: Recent Labs  Lab 08/12/24 1618 08/13/24 0902  AST 28 32  ALT 29 29  ALKPHOS 35* 42  BILITOT 0.4 0.3  PROT 5.0* 4.8*  ALBUMIN 3.2* 3.0*   BNP (last 3 results) Recent Labs    08/12/24 1618  PROBNP 5,942.0*   CBG: Recent Labs  Lab 08/13/24 0335 08/13/24 0852 08/13/24 1144 08/13/24 1607 08/13/24 2150  GLUCAP 153* 123* 107* 131* 226*   Thyroid  Function Tests: Recent Labs    08/13/24 0023  TSH 0.938   Sepsis Labs: Recent Labs  Lab 08/12/24 1617  LATICACIDVEN 1.1   Recent Results (from the past 240 hours)  Blood culture (routine x 2)     Status: None (Preliminary result)   Collection Time: 08/12/24  4:09 PM   Specimen: BLOOD  Result Value Ref Range Status   Specimen Description BLOOD SITE NOT SPECIFIED  Final   Special Requests   Final    BOTTLES DRAWN AEROBIC AND ANAEROBIC Blood Culture adequate volume   Culture   Final    NO GROWTH < 24 HOURS Performed at [facility], [address], [place], [place] [address]    Report Status PENDING  Incomplete  Blood culture (routine x 2)     Status: None (Preliminary result)   Collection Time: 08/12/24  4:14 PM   Specimen: BLOOD LEFT FOREARM  Result Value Ref Range Status   Specimen Description BLOOD LEFT FOREARM  Final   Special Requests   Final    BOTTLES DRAWN AEROBIC AND ANAEROBIC Blood Culture adequate volume   Culture   Final    NO GROWTH < 24 HOURS Performed at [facility], [address], [place], [place]  [address]    Report Status PENDING  Incomplete  Resp panel by RT-PCR (RSV, Flu A&B, Covid) Anterior Nasal Swab     Status: Abnormal   Collection Time: 08/12/24  4:42 PM   Specimen: Anterior Nasal Swab  Result Value Ref Range Status   SARS Coronavirus 2 by RT PCR NEGATIVE NEGATIVE Final   Influenza A by PCR POSITIVE (A) NEGATIVE Final   Influenza B by PCR NEGATIVE NEGATIVE Final    Comment: (NOTE) The Xpert Xpress SARS-CoV-2/FLU/RSV plus assay is intended as an aid in the diagnosis of  influenza from Nasopharyngeal swab specimens and should not be used as a sole basis for treatment. Nasal washings and aspirates are unacceptable for Xpert Xpress SARS-CoV-2/FLU/RSV testing.  Fact Sheet for Patients: bloggercourse.com  Fact Sheet for Healthcare Providers: seriousbroker.it  This test is not yet approved or cleared by the [place]  FDA and has been authorized for detection and/or diagnosis of SARS-CoV-2 by FDA under an Emergency Use Authorization (EUA). This EUA will remain in effect (meaning this test can be used) for the duration of the COVID-19 declaration under Section 564(b)(1) of the Act, 21 U.S.C. section 360bbb-3(b)(1), unless the authorization is terminated or revoked.     Resp Syncytial Virus by PCR NEGATIVE NEGATIVE Final    Comment: (NOTE) Fact Sheet for Patients: bloggercourse.com  Fact Sheet for Healthcare Providers: seriousbroker.it  This test is not yet approved or cleared by the [place]  FDA and has been authorized for detection and/or diagnosis of SARS-CoV-2 by FDA under an Emergency Use Authorization (EUA). This EUA will remain in effect (meaning this test can be used) for the duration of the COVID-19 declaration under Section 564(b)(1) of the Act, 21 U.S.C. section 360bbb-3(b)(1), unless the authorization is terminated or revoked.  Performed at [facility], [address], [place], [place] [address]   MRSA Next Gen by PCR, Nasal     Status: None   Collection Time: 08/12/24 11:21 PM   Specimen: Nasal Mucosa; Nasal Swab  Result Value Ref Range Status   MRSA by PCR Next Gen NOT DETECTED NOT DETECTED Final    Comment: (NOTE) The GeneXpert MRSA Assay (FDA approved for NASAL specimens only), is one component of a comprehensive MRSA colonization surveillance program. It is not intended to diagnose MRSA infection nor to guide or monitor treatment for MRSA infections. Test performance is not FDA approved in patients less than 52 years old. Performed at [facility], [address], [place], [place] [address]     Radiology Studies: VAS US  LOWER EXTREMITY VENOUS (DVT) (ONLY [facility] & [facility]) Result Date: 08/13/2024  Lower Venous DVT Study Patient Name:  Monica Faulkner  Date of Exam:   08/13/2024 Medical Rec #: [id number]           Accession #:    [id number] Date of Birth: 1940-11-28            Patient Gender: F Patient Age:   83 years Exam Location:  [facility] Procedure:      VAS US  LOWER EXTREMITY VENOUS (DVT) Referring Phys: HAYLEY NAASZ --------------------------------------------------------------------------------  Indications: Subacute to chronic PE by CTA of chest. Flu A. Other Indications: Recently hospitalized from 07/22/24 to 08/01/24 for pneumonia                    and sepsis. Presented 08/13/24 with acute hypoxic respiratory                    failure, colitis, and sepsis. Risk Factors: Cancer; History of basal cell carcinoma. Limitations: Patient condition/altered mental status, unwilling/unable to keep legs in proper position for adequate imaging, edema, and poor ultrasound/tissue interface. Comparison Study: No prior study on file Performing Technologist: Alberta Lis RVS  Examination Guidelines: A complete evaluation includes B-mode imaging, spectral Doppler, color Doppler, and power Doppler as needed of all accessible  portions of each vessel. Bilateral testing is considered an integral part of a complete examination. Limited examinations for reoccurring indications may be performed as noted. The reflux portion  of the exam is performed with the patient in reverse Trendelenburg.  +---------+---------------+---------+-----------+----------+-------------------+ RIGHT    CompressibilityPhasicitySpontaneityPropertiesThrombus Aging      +---------+---------------+---------+-----------+----------+-------------------+ CFV      Full           Yes      No                                       +---------+---------------+---------+-----------+----------+-------------------+ SFJ      Full                                                             +---------+---------------+---------+-----------+----------+-------------------+ FV Prox  Full           Yes      No                                       +---------+---------------+---------+-----------+----------+-------------------+ FV Mid   Full                                                             +---------+---------------+---------+-----------+----------+-------------------+ FV Distal               Yes      No                   patent by color and                                                       Doppler             +---------+---------------+---------+-----------+----------+-------------------+ PFV      Full           Yes      No                                       +---------+---------------+---------+-----------+----------+-------------------+ POP                     Yes      No                   patent by color and                                                       Doppler             +---------+---------------+---------+-----------+----------+-------------------+ PTV      Full                                                              +---------+---------------+---------+-----------+----------+-------------------+  PERO                                                  Not well visualized +---------+---------------+---------+-----------+----------+-------------------+   +---------+---------------+---------+-----------+----------+-------------------+ LEFT     CompressibilityPhasicitySpontaneityPropertiesThrombus Aging      +---------+---------------+---------+-----------+----------+-------------------+ CFV      Full           Yes      No                                       +---------+---------------+---------+-----------+----------+-------------------+ SFJ      Full           Yes      No                                       +---------+---------------+---------+-----------+----------+-------------------+ FV Prox                 Yes      No                   patent by color and                                                       Doppler             +---------+---------------+---------+-----------+----------+-------------------+ FV Mid   Full           Yes      No                                       +---------+---------------+---------+-----------+----------+-------------------+ FV Distal               Yes      No                                       +---------+---------------+---------+-----------+----------+-------------------+ PFV                                                   patent by color     +---------+---------------+---------+-----------+----------+-------------------+ POP      Full           Yes      No                                       +---------+---------------+---------+-----------+----------+-------------------+ PTV  Not well visualized +---------+---------------+---------+-----------+----------+-------------------+ PERO     None                                         Age Indeterminate    +---------+---------------+---------+-----------+----------+-------------------+    Summary: RIGHT: - There is no evidence of deep vein thrombosis in the lower extremity. However, portions of this examination were limited- see technologist comments above.  Pulsatile waveforms noted throughout  LEFT: - Findings consistent with age indeterminate deep vein thrombosis involving the left peroneal veins.  Pulsatile waveforms noted throughout.  *See table(s) above for measurements and observations.    Preliminary    CT Angio Chest/Abd/Pel for Dissection W and/or Wo Contrast Result Date: 08/12/2024 EXAM: CTA CHEST, ABDOMEN AND PELVIS WITH AND WITHOUT CONTRAST 08/12/2024 07:46:15 PM TECHNIQUE: CTA of the chest was performed with and without the administration of intravenous contrast. CTA of the abdomen and pelvis was performed with and without the administration of intravenous contrast. 100 mL of iohexol  (OMNIPAQUE ) 350 MG/ML injection was administered. Multiplanar reformatted images are provided for review. MIP images are provided for review. Automated exposure control, iterative reconstruction, and/or weight based adjustment of the mA/kV was utilized to reduce the radiation dose to as low as reasonably achievable. COMPARISON: 07/22/2024 CLINICAL HISTORY: acute blood loss anemia, pneumonia, gastrointestinal hemorrhage FINDINGS: VASCULATURE: AORTA: Moderate atherosclerotic calcification within the thoracic aorta. No aortic aneurysm. No dissection. No abdominal aortic aneurysm. PULMONARY ARTERIES: Intraluminal filling defect within the right upper lobar pulmonary artery centrally (65/7) in keeping with a pulmonary embolus, likely subacute to chronic in nature given its somewhat irregular contour and mural eccentricity. Central pulmonary arteries are of normal caliber. GREAT VESSELS OF AORTIC ARCH: No acute finding. No dissection. No arterial occlusion or significant stenosis. 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 Superior mesenteric artery and inferior mesenteric arteries are widely patent at their origin making this a questionable clinical significance. SUPERIOR MESENTERIC ARTERY: Widely patent at its origin. No occlusion or significant stenosis. INFERIOR MESENTERIC ARTERY: Widely patent. No occlusion or significant stenosis. RENAL ARTERIES: No acute finding. No occlusion or significant stenosis. ILIAC ARTERIES: Moderate aortoiliac atherosclerotic calcification. No occlusion or significant stenosis. CHEST: MEDIASTINUM: Mild coronary artery calcification. Global cardiac size within normal limits. No CT evidence of right heart strain. No pericardial effusion. No mediastinal lymphadenopathy. LUNGS AND PLEURA: Mild interstitial pulmonary edema. Small right and trace left pleural effusions with associated bibasilar compressive atelectasis. Superimposed consolidation within the peripheral right lower lobe may reflect changes of acute infection or aspiration. No pneumothorax. THORACIC BONES AND SOFT TISSUES: No acute bone or soft tissue abnormality. ABDOMEN AND PELVIS: LIVER: The liver is unremarkable. GALLBLADDER AND BILE DUCTS: Cholelithiasis without superimposed pericholecystic inflammatory change. No intra or extrahepatic biliary ductal dilation. SPLEEN: The spleen is unremarkable. PANCREAS: The pancreas is unremarkable. ADRENAL GLANDS: Bilateral adrenal glands demonstrate no acute abnormality. KIDNEYS, URETERS AND BLADDER: Foley catheter balloon seen within a decompressed bladder lumen. No stones in the kidneys or ureters. No hydronephrosis. No perinephric or periureteral stranding. GI AND BOWEL: Moderate sigmoid diverticulosis. There is circumferential mild bowel wall thickening and pericolonic inflammatory stranding involving the descending colon which may reflect changes of a infectious, inflammatory, or potentially ischemic colitis. The inferior mesenteric, sigmoid, and left  colic artery are widely patent, however, making the latter consideration less likely. Note that the examination is limited as venous  phase imaging was not performed. The stomach, small bowel, and large bowel are otherwise unremarkable. There is no bowel obstruction. No abnormal bowel wall thickening or distension. REPRODUCTIVE: Reproductive organs are unremarkable. PERITONEUM AND RETROPERITONEUM: No ascites or free air. LYMPH NODES: No lymphadenopathy. ABDOMINAL BONES AND SOFT TISSUES: Severe thoracolumbar sigmoid scoliosis. Osseous structures are age appropriate. No acute bone abnormality. No lytic or blastic bone lesion. No acute soft tissue abnormality. IMPRESSION: 1. Circumferential mild bowel wall thickening and pericolonic inflammatory stranding involving the descending colon, most consistent with colitis; ischemic colitis is considered less likely, though evaluation is limited without venous phase imaging. 2. Peripheral right lower lobe consolidation, which may reflect infection or aspiration. 3. Pulmonary embolus in the central right upper lobar pulmonary artery, likely subacute to chronic in nature, without CT evidence of right heart strain. 4. 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 5. Chronic findings include coronary artery calcification and systemic atherosclerosis, cholelithiasis, sigmoid diverticulosis, and thoracolumbar scoliosis. Electronically signed by: Dorethia Molt MD 08/12/2024 08:12 PM EST RP Workstation: [id number]   DG Chest Portable 1 View Result Date: 08/12/2024 EXAM: 1 VIEW(S) XRAY OF THE CHEST 08/12/2024 04:39:00 PM COMPARISON: 07/23/2024 CLINICAL HISTORY: sob FINDINGS: LUNGS AND PLEURA: New right basilar airspace opacity. No pleural effusion. No pneumothorax. HEART AND MEDIASTINUM: Tortuous thoracic aorta. Atherosclerotic calcifications. BONES AND SOFT TISSUES: Right  convex thoracic scoliosis. IMPRESSION: 1. New right basilar airspace opacity, suspicious for pneumonia. 2. Tortuous thoracic aorta with atherosclerotic calcifications. Electronically signed by: Greig Pique MD 08/12/2024 05:44 PM EST RP Workstation: [id number]    Scheduled Meds:  sodium chloride    Intravenous Once   Chlorhexidine  Gluconate Cloth  6 each Topical Daily   insulin  aspart  0-5 Units Subcutaneous QHS   insulin  aspart  0-9 Units Subcutaneous TID WC   LORazepam   0.5 mg Intravenous QHS   oseltamivir   30 mg Oral Daily   pantoprazole  (PROTONIX ) IV  40 mg Intravenous Q12H   Continuous Infusions:  azithromycin  Stopped (08/14/24 0200)   cefTRIAXone  (ROCEPHIN )  IV Stopped (08/14/24 0105)   dextrose  5% lactated ringers  50 mL/hr at 08/14/24 0600   metronidazole  Stopped (08/13/24 2322)     LOS: 2 days   Time spent:   Elsie JAYSON Montclair, DO [facility]  If 7PM-7AM, please contact night-coverage [email or web address]  08/14/2024, 6:55 AM      "

## 2024-08-14 NOTE — Progress Notes (Addendum)
" °   08/14/24 0530  Provider Notification  Provider Name/Title [facility]  Date Provider Notified 08/14/24  Time Provider Notified 0530  Method of Notification Call  Notification Reason New onset of dysrhythmia  Type of New Onset of Dysrhythmia Sinus tachycardia;Atrial flutter  Symptoms of New Onset of Dysrhythmia Asymptomatic  Provider response Evaluate remotely   0537 - 12 lead EKG shows atrial flutter with variable AV block, see transmitted EKG. BP 157/80. Patient arousable and neuro exam unchanged  0558 - see order for metoprolol  5mg  x3. See MAR for corresponding administration times  "

## 2024-08-14 NOTE — Progress Notes (Signed)
 eLink Physician-Brief Progress Note Patient Name: Monica Faulkner DOB: 04/15/1941 MRN: [id number]   Date of Service  08/14/2024  HPI/Events of Note  RN request renewal of restraints. Also asking about some renewal of IV fluid over the bed expired earlier today.  eICU Interventions  Chart reviewed.  Restraints renewed.  No indication for renewal of IV fluids at this point.  Discussed with RN.     Intervention Category Minor Interventions: Other:  Monica Faulkner 08/14/2024, 9:50 PM

## 2024-08-15 DIAGNOSIS — J9621 Acute and chronic respiratory failure with hypoxia: Secondary | ICD-10-CM | POA: Diagnosis not present

## 2024-08-15 LAB — BASIC METABOLIC PANEL WITH GFR
Anion gap: 9 (ref 5–15)
BUN: 16 mg/dL (ref 8–23)
CO2: 25 mmol/L (ref 22–32)
Calcium: 8.5 mg/dL — ABNORMAL LOW (ref 8.9–10.3)
Chloride: 104 mmol/L (ref 98–111)
Creatinine, Ser: 0.8 mg/dL (ref 0.44–1.00)
GFR, Estimated: >60 mL/min
Glucose, Bld: 132 mg/dL — ABNORMAL HIGH (ref 70–99)
Potassium: 3.4 mmol/L — ABNORMAL LOW (ref 3.5–5.1)
Sodium: 138 mmol/L (ref 135–145)

## 2024-08-15 LAB — GLUCOSE, CAPILLARY
Glucose-Capillary: 144 mg/dL — ABNORMAL HIGH (ref 70–99)
Glucose-Capillary: 145 mg/dL — ABNORMAL HIGH (ref 70–99)
Glucose-Capillary: 151 mg/dL — ABNORMAL HIGH (ref 70–99)
Glucose-Capillary: 130 mg/dL — ABNORMAL HIGH (ref 70–99)

## 2024-08-15 LAB — CBC
HCT: 23.1 % — ABNORMAL LOW (ref 36.0–46.0)
Hemoglobin: 7.8 g/dL — ABNORMAL LOW (ref 12.0–15.0)
MCH: 31.2 pg (ref 26.0–34.0)
MCHC: 33.8 g/dL (ref 30.0–36.0)
MCV: 92.4 fL (ref 80.0–100.0)
Platelets: 259 K/uL (ref 150–400)
RBC: 2.5 MIL/uL — ABNORMAL LOW (ref 3.87–5.11)
RDW: 17.3 % — ABNORMAL HIGH (ref 11.5–15.5)
WBC: 6.8 K/uL (ref 4.0–10.5)
nRBC: 0.4 % — ABNORMAL HIGH (ref 0.0–0.2)

## 2024-08-15 MED ORDER — HYDRALAZINE HCL 50 MG PO TABS
100.0000 mg | ORAL_TABLET | Freq: Three times a day (TID) | ORAL | Status: DC
  Administered 2024-08-15 – 2024-08-17 (×6): 100 mg via ORAL
  Filled 2024-08-15 (×6): qty 2

## 2024-08-15 MED ORDER — TRAZODONE HCL 50 MG PO TABS
50.0000 mg | ORAL_TABLET | Freq: Once | ORAL | Status: AC
  Administered 2024-08-15: 50 mg via ORAL
  Filled 2024-08-15: qty 1

## 2024-08-15 MED ORDER — CLONAZEPAM 0.5 MG PO TABS
0.5000 mg | ORAL_TABLET | Freq: Every day | ORAL | Status: DC
  Administered 2024-08-15 – 2024-08-24 (×10): 0.5 mg via ORAL
  Filled 2024-08-15 (×10): qty 1

## 2024-08-15 MED ORDER — LABETALOL HCL 5 MG/ML IV SOLN
20.0000 mg | Freq: Once | INTRAVENOUS | Status: AC
  Administered 2024-08-15: 20 mg via INTRAVENOUS
  Filled 2024-08-15: qty 4

## 2024-08-15 MED ORDER — IRBESARTAN 150 MG PO TABS
300.0000 mg | ORAL_TABLET | Freq: Every day | ORAL | Status: DC
  Administered 2024-08-15 – 2024-08-25 (×11): 300 mg via ORAL
  Filled 2024-08-15 (×3): qty 2
  Filled 2024-08-15 (×3): qty 1
  Filled 2024-08-15 (×2): qty 2
  Filled 2024-08-15 (×2): qty 1
  Filled 2024-08-15: qty 2

## 2024-08-15 MED ORDER — ESCITALOPRAM OXALATE 10 MG PO TABS
10.0000 mg | ORAL_TABLET | Freq: Every day | ORAL | Status: DC
  Administered 2024-08-15 – 2024-08-25 (×11): 10 mg via ORAL
  Filled 2024-08-15 (×11): qty 1

## 2024-08-15 MED ORDER — ATENOLOL 25 MG PO TABS
25.0000 mg | ORAL_TABLET | Freq: Every evening | ORAL | Status: DC
  Administered 2024-08-15 – 2024-08-24 (×10): 25 mg via ORAL
  Filled 2024-08-15 (×11): qty 1

## 2024-08-15 NOTE — Progress Notes (Signed)
 eLink Physician-Brief Progress Note Patient Name: Monica Faulkner DOB: [birth year] MRN: [id number]   Date of Service  08/15/2024  HPI/Events of Note  Patient is agitated causing her to be hypotensive.  Usually gets lorazepam  0.5 mg at bedtime which wears off and then patient gets agitated.  Currently blood pressure is 174/64 but hydralazine  is not due for another hour.  eICU Interventions  Video assessment of patient after discussion with bedside RN.  Will give a one-time dose of IV labetalol  for blood pressure control.  Will also give 50 mg of trazodone  to help patient's sleep and hopefully get over her delirium.     Intervention Category Intermediate Interventions: Other:;Hypertension - evaluation and management  Jerilynn Berg 08/15/2024, 3:18 AM

## 2024-08-15 NOTE — Progress Notes (Signed)
 " PROGRESS NOTE    Monica Faulkner  FMW:[id number] DOB: [birth year] DOA: 08/12/2024 PCP: Verdia Lombard, MD   Brief Narrative:  Monica Faulkner is a 83 y.o. female with medical history significant of basal cell carcinoma, type 2 diabetes, GERD, essential hypertension, anxiety disorder, depression.  Patient presents to our facility on 08/12/2024 with diagnosis from rehab with the flu and pneumonia with profound hypoxia and respiratory distress.  Patient initially requiring nonrebreather and heated high flow to maintain sats above 90%.  Patient found to have profound anemia at 4.5 -FOBT positive but dark brown stool noted on exam.  She also has notable subacute PE and age-indeterminate DVT of the left peroneal vein complicating her symptoms. CT also notable for colitis which is likely the origin of her anemia.  Patient was just discharged from our facility on 12/15 in the setting of profound AKI, lactic acidosis and dehydration with metabolic encephalopathy. Discharged to skilled facility at that time, 11 days later she returned with complaints as outlined above.  Assessment & Plan:   Principal Problem:   Acute on chronic hypoxic respiratory failure (HCC) Active Problems:   Hyponatremia   Acute metabolic encephalopathy   Controlled type 2 diabetes mellitus without complication, without long-term current use of insulin  (HCC)   Essential hypertension   GERD (gastroesophageal reflux disease)   GI bleed   Symptomatic anemia   Acute colitis   Acute pulmonary embolism (HCC)   Influenza A with pneumonia   Goals of care  - Lengthy discussion today with patient's brother Monica Faulkner who is her default power of attorney as he is her only living relative. - We discussed patient's current status and comorbidities and given her age and current illness we discussed her CODE STATUS including intubation and CPR.  He states after further description that she would not want any of that -and as such we  will transition patient to DO NOT RESUSCITATE CODE STATUS. - At this time the patient does continue to improve somewhat but continues to be tenuous.  He and I discussed that hopefully she continues to improve but discussed the possibility that she may not or she may decline over the next few days at which time we would likely need to discuss further about her care but at this point given her improvement we will continue current treatment regimen and transition patient to a DNR CODE STATUS.  Acute metabolic encephalopathy, multifactorial, POA  Rule out withdrawal/toxic encephalopathy - At intake patient was noted to be lethargic and poorly arousable, mental status continues to wax and wane oriented to person only this morning  - Continue supplemental treatment as below, avoid CNS depressants, delirium precautions in place - Polypharmacy unlikely although patient is on benzodiazepines low-dose daily she does not appear to be in withdrawals, will continue at this time to avoid precipitation of withdrawals given her tenuous status  Acute hypoxic respiratory failure Influenza pneumonia Bibasilar pneumonia, rule out bacterial component Subactute PE Age indeterminant DVT, L peroneal vein - Patient's respiratory status is multifactorial in the setting of subacute PE, influenza pneumonia with concern over possible underlying bacterial pneumonia as well -continue Tamiflu  -Continue ceftriaxone  for presumed pneumonia -azithromycin  discontinued - Patient continues to require moderately high volumes of oxygen to maintain sats above 90% - Patient's baseline oxygen requirements are room air - Patient currently requiring heated high flow at 75% 45 L/min (down from 90%)  - Oxygen supplementation is complicated by her mental status as she continues to remove oxygen when not  being monitored, wrist restraints have been discussed if patient continues to be noncompliant with treatment for her own safety - At this time  patient is unable to initiate anticoagulation in the setting of presumed GI bleed    Severe acute symptomatic anemia Acute GI bleed Acute colitis - Discussed case with GI, given her tenuous status there is no indication for consultation at this time as she would not be appropriate or stable for endoscopy. Will call GI back (or call IR) if patient stabilizes and notable source for intervention is identified. - Status post 2 unit PRBC -hemoglobin corrected to 8.6 (baseline 11) - Continue metronidazole  for presumed colitis treatment (on azithromycin  and ceftriaxone  as above for pneumonia)   Essential hypertension:  - Restart home medications as below including atenolol , hydralazine , irbesartan (substituted for home telmisartan)  AKI: Likely prerenal, downtrending appropriately with supportive care  Subacute pulmonary embolism: Appears to be chronic/subacute per imaging -patient not a candidate for anticoagulation at this time given acute anemia and GI bleed as above -likely not a candidate for IVC filter given acute illness/instability and PE already established/subacute versus chronic.  If patient stabilizes and is still unable to tolerate anticoagulation would consider IVC filter given DVT findings but again at this point risk outweighs any benefits.   GERD: Continue with PPIs    DVT prophylaxis: SCDs Start: 08/12/24 2331 no chemical prophylaxis in the setting of GI bleed Code Status:   Code Status: Limited: Do not attempt resuscitation (DNR) -DNR-LIMITED -Do Not Intubate/DNI  **would recommend CODE STATUS discussion once patient's family is able to be reached**patient currently unable to discuss complex medical decisions given her mental status Family Communication: Attempted to call emergency contact (Brother Monica Faulkner) multiple times with no answer - this is after his number was updated/corrected by the team.  Status is: Inpatient  Dispo: The patient is from: SNF              Anticipated d/c is  to: To be determined              Anticipated d/c date is: To be determined              Patient currently not medically stable for discharge  Consultants:  PCCM GI(discussed there is currently no indication for intervention and would reconsult them if patient's symptoms improved)  Procedures:  None  Antimicrobials:  Azithromycin , ceftriaxone , metronidazole   Subjective: Overnight patient continues to improve in both symptoms and hypoxia, patient awake alert and oriented to person and place this morning, continues to have episodes of confusion, reports her respiratory status is way better but otherwise feels quite poor and short of breath.  She denies any overt pain nausea vomiting diarrhea or constipation.  Objective: Vitals:   08/15/24 0504 08/15/24 0600 08/15/24 0640 08/15/24 0700  BP: (!) 173/55 (!) 172/83  (!) 179/72  Pulse: 75 96 91 87  Resp: (!) 22 19 (!) 24 18  Temp: 98.1 F (36.7 C) 98.6 F (37 C) 99.1 F (37.3 C) 99.1 F (37.3 C)  TempSrc:      SpO2: 98% 95% 91% 97%  Weight:      Height:        Intake/Output Summary (Last 24 hours) at 08/15/2024 0718 Last data filed at 08/15/2024 0600 Gross per 24 hour  Intake 650 ml  Output 1210 ml  Net -560 ml   Filed Weights   08/12/24 1559 08/14/24 0400  Weight: 60.5 kg 71.6 kg    Examination:  General: No  acute distress but uncomfortable on heated high flow HEENT:  Normocephalic atraumatic.  Sclerae nonicteric, noninjected.  Extraocular movements intact bilaterally. Lungs: Profoundly diminished diffusely with marked bilateral rhonchi Heart: Tachycardic regular rhythm without overt murmurs. Abdomen:  Soft, nontender, nondistended.  Without guarding or rebound. Extremities: Without cyanosis, or clubbing  Data Reviewed: I have personally reviewed following labs and imaging studies  CBC: Recent Labs  Lab 08/12/24 1618 08/13/24 0023 08/13/24 0902 08/13/24 1113 08/13/24 1846 08/14/24 0048 08/15/24 0210  WBC  8.8   < > 10.2 10.5 8.3 7.3 6.8  NEUTROABS 7.1  --   --   --   --   --   --   HGB 4.5*   < > 7.9* 8.1* 8.0* 7.9* 7.8*  HCT 14.1*   < > 23.4* 23.7* 23.9* 23.7* 23.1*  MCV 102.2*   < > 90.7 91.2 92.3 92.6 92.4  PLT 371   < > 305 315 292 277 259   < > = values in this interval not displayed.   Basic Metabolic Panel: Recent Labs  Lab 08/12/24 1618 08/13/24 0902 08/14/24 0709 08/15/24 0210  NA 133* 134* 136 138  K 4.8 4.3 3.5 3.4*  CL 102 104 103 104  CO2 23 22 23 25   GLUCOSE 148* 125* 242* 132*  BUN 45* 34* 19 16  CREATININE 1.33* 1.18* 0.92 0.80  CALCIUM 8.9 8.7* 8.3* 8.5*  MG 1.9  --  1.7  --   PHOS  --   --  2.9  --    GFR: Estimated Creatinine Clearance: 53.8 mL/min (by C-G formula based on SCr of 0.8 mg/dL).  Liver Function Tests: Recent Labs  Lab 08/12/24 1618 08/13/24 0902  AST 28 32  ALT 29 29  ALKPHOS 35* 42  BILITOT 0.4 0.3  PROT 5.0* 4.8*  ALBUMIN 3.2* 3.0*   BNP (last 3 results) Recent Labs    08/12/24 1618  PROBNP 5,942.0*   CBG: Recent Labs  Lab 08/14/24 0739 08/14/24 1120 08/14/24 1529 08/14/24 1834 08/14/24 2108  GLUCAP 238* 198* 158* 156* 132*   Thyroid  Function Tests: Recent Labs    08/13/24 0023  TSH 0.938   Sepsis Labs: Recent Labs  Lab 08/12/24 1617  LATICACIDVEN 1.1   Recent Results (from the past 240 hours)  Blood culture (routine x 2)     Status: None (Preliminary result)   Collection Time: 08/12/24  4:09 PM   Specimen: BLOOD  Result Value Ref Range Status   Specimen Description BLOOD SITE NOT SPECIFIED  Final   Special Requests   Final    BOTTLES DRAWN AEROBIC AND ANAEROBIC Blood Culture adequate volume   Culture   Final    NO GROWTH 2 DAYS Performed at [facility], [address] [place], [place] [address]    Report Status PENDING  Incomplete  Blood culture (routine x 2)     Status: None (Preliminary result)   Collection Time: 08/12/24  4:14 PM   Specimen: BLOOD LEFT FOREARM  Result Value Ref Range  Status   Specimen Description BLOOD LEFT FOREARM  Final   Special Requests   Final    BOTTLES DRAWN AEROBIC AND ANAEROBIC Blood Culture adequate volume   Culture   Final    NO GROWTH 2 DAYS Performed at [facility], [address] [place], [place] [address]    Report Status PENDING  Incomplete  Resp panel by RT-PCR (RSV, Flu A&B, Covid) Anterior Nasal Swab  Status: Abnormal   Collection Time: 08/12/24  4:42 PM   Specimen: Anterior Nasal Swab  Result Value Ref Range Status   SARS Coronavirus 2 by RT PCR NEGATIVE NEGATIVE Final   Influenza A by PCR POSITIVE (A) NEGATIVE Final   Influenza B by PCR NEGATIVE NEGATIVE Final    Comment: (NOTE) The Xpert Xpress SARS-CoV-2/FLU/RSV plus assay is intended as an aid in the diagnosis of influenza from Nasopharyngeal swab specimens and should not be used as a sole basis for treatment. Nasal washings and aspirates are unacceptable for Xpert Xpress SARS-CoV-2/FLU/RSV testing.  Fact Sheet for Patients: bloggercourse.com  Fact Sheet for Healthcare Providers: seriousbroker.it  This test is not yet approved or cleared by the [place]  FDA and has been authorized for detection and/or diagnosis of SARS-CoV-2 by FDA under an Emergency Use Authorization (EUA). This EUA will remain in effect (meaning this test can be used) for the duration of the COVID-19 declaration under Section 564(b)(1) of the Act, 21 U.S.C. section 360bbb-3(b)(1), unless the authorization is terminated or revoked.     Resp Syncytial Virus by PCR NEGATIVE NEGATIVE Final    Comment: (NOTE) Fact Sheet for Patients: bloggercourse.com  Fact Sheet for Healthcare Providers: seriousbroker.it  This test is not yet approved or cleared by the [place]  FDA and has been authorized for detection and/or diagnosis of SARS-CoV-2 by FDA under an Emergency Use Authorization  (EUA). This EUA will remain in effect (meaning this test can be used) for the duration of the COVID-19 declaration under Section 564(b)(1) of the Act, 21 U.S.C. section 360bbb-3(b)(1), unless the authorization is terminated or revoked.  Performed at [facility], [address] [place], [place] [address]   Urine Culture     Status: None   Collection Time: 08/12/24  8:34 PM   Specimen: Urine, Catheterized  Result Value Ref Range Status   Specimen Description URINE, CATHETERIZED  Final   Special Requests NONE  Final   Culture   Final    NO GROWTH Performed at [facility], [address] [place], [place] [address]    Report Status 08/14/2024 FINAL  Final  MRSA Next Gen by PCR, Nasal     Status: None   Collection Time: 08/12/24 11:21 PM   Specimen: Nasal Mucosa; Nasal Swab  Result Value Ref Range Status   MRSA by PCR Next Gen NOT DETECTED NOT DETECTED Final    Comment: (NOTE) The GeneXpert MRSA Assay (FDA approved for NASAL specimens only), is one component of a comprehensive MRSA colonization surveillance program. It is not intended to diagnose MRSA infection nor to guide or monitor treatment for MRSA infections. Test performance is not FDA approved in patients less than 77 years old. Performed at [facility], [address], [place], [place] [address]     Radiology Studies: VAS US  LOWER EXTREMITY VENOUS (DVT) (ONLY [facility] & [facility]) Result Date: 08/14/2024  Lower Venous DVT Study Patient Name:  Monica Faulkner  Date of Exam:   08/13/2024 Medical Rec #: [id number]           Accession #:    [id number] Date of Birth: 07-Dec-1940            Patient Gender: F Patient Age:   85 years Exam Location:  [facility] Procedure:      VAS US  LOWER EXTREMITY VENOUS (DVT) Referring Phys: HAYLEY NAASZ --------------------------------------------------------------------------------  Indications: Subacute to chronic PE by CTA of chest. Flu A. Other Indications:  Recently  hospitalized from 07/22/24 to 08/01/24 for pneumonia                    and sepsis. Presented 08/13/24 with acute hypoxic respiratory                    failure, colitis, and sepsis. Risk Factors: Cancer; History of basal cell carcinoma. Limitations: Patient condition/altered mental status, unwilling/unable to keep legs in proper position for adequate imaging, edema, and poor ultrasound/tissue interface. Comparison Study: No prior study on file Performing Technologist: Alberta Lis RVS  Examination Guidelines: A complete evaluation includes B-mode imaging, spectral Doppler, color Doppler, and power Doppler as needed of all accessible portions of each vessel. Bilateral testing is considered an integral part of a complete examination. Limited examinations for reoccurring indications may be performed as noted. The reflux portion of the exam is performed with the patient in reverse Trendelenburg.  +---------+---------------+---------+-----------+----------+-------------------+ RIGHT    CompressibilityPhasicitySpontaneityPropertiesThrombus Aging      +---------+---------------+---------+-----------+----------+-------------------+ CFV      Full           Yes      No                                       +---------+---------------+---------+-----------+----------+-------------------+ SFJ      Full                                                             +---------+---------------+---------+-----------+----------+-------------------+ FV Prox  Full           Yes      No                                       +---------+---------------+---------+-----------+----------+-------------------+ FV Mid   Full                                                             +---------+---------------+---------+-----------+----------+-------------------+ FV Distal               Yes      No                   patent by color and                                                       Doppler              +---------+---------------+---------+-----------+----------+-------------------+ PFV      Full           Yes      No                                       +---------+---------------+---------+-----------+----------+-------------------+  POP                     Yes      No                   patent by color and                                                       Doppler             +---------+---------------+---------+-----------+----------+-------------------+ PTV      Full                                                             +---------+---------------+---------+-----------+----------+-------------------+ PERO                                                  Not well visualized +---------+---------------+---------+-----------+----------+-------------------+   +---------+---------------+---------+-----------+----------+-------------------+ LEFT     CompressibilityPhasicitySpontaneityPropertiesThrombus Aging      +---------+---------------+---------+-----------+----------+-------------------+ CFV      Full           Yes      No                                       +---------+---------------+---------+-----------+----------+-------------------+ SFJ      Full           Yes      No                                       +---------+---------------+---------+-----------+----------+-------------------+ FV Prox                 Yes      No                   patent by color and                                                       Doppler             +---------+---------------+---------+-----------+----------+-------------------+ FV Mid   Full           Yes      No                                       +---------+---------------+---------+-----------+----------+-------------------+ FV Distal               Yes      No                                        +---------+---------------+---------+-----------+----------+-------------------+  PFV                                                   patent by color     +---------+---------------+---------+-----------+----------+-------------------+ POP      Full           Yes      No                                       +---------+---------------+---------+-----------+----------+-------------------+ PTV                                                   Not well visualized +---------+---------------+---------+-----------+----------+-------------------+ PERO     None                                         Age Indeterminate   +---------+---------------+---------+-----------+----------+-------------------+     Summary: RIGHT: - There is no evidence of deep vein thrombosis in the lower extremity. However, portions of this examination were limited- see technologist comments above.  Pulsatile waveforms noted throughout  LEFT: - Findings consistent with age indeterminate deep vein thrombosis involving the left peroneal veins.  Pulsatile waveforms noted throughout.  *See table(s) above for measurements and observations. Electronically signed by Debby Robertson on 08/14/2024 at 11:00:51 AM.    Final     Scheduled Meds:  sodium chloride    Intravenous Once   Chlorhexidine  Gluconate Cloth  6 each Topical Daily   diltiazem   15 mg Intravenous Once   insulin  aspart  0-5 Units Subcutaneous QHS   insulin  aspart  0-9 Units Subcutaneous TID WC   LORazepam   0.5 mg Intravenous QHS   oseltamivir   30 mg Oral Daily   pantoprazole  (PROTONIX ) IV  40 mg Intravenous Q12H   Continuous Infusions:  cefTRIAXone  (ROCEPHIN )  IV Stopped (08/15/24 0012)   metronidazole  Stopped (08/14/24 2212)     LOS: 3 days   Time spent:   Elsie JAYSON Montclair, DO [facility]  If 7PM-7AM, please contact night-coverage [email or web address]  08/15/2024, 7:18 AM      "

## 2024-08-15 NOTE — Plan of Care (Signed)
" °  Problem: Education: Goal: Knowledge of General Education information will improve Description: Including pain rating scale, medication(s)/side effects and non-pharmacologic comfort measures Outcome: Progressing   Problem: Health Behavior/Discharge Planning: Goal: Ability to manage health-related needs will improve Outcome: Progressing   Problem: Clinical Measurements: Goal: Ability to maintain clinical measurements within normal limits will improve Outcome: Progressing Goal: Will remain free from infection Outcome: Progressing Goal: Diagnostic test results will improve Outcome: Progressing Goal: Respiratory complications will improve Outcome: Progressing Goal: Cardiovascular complication will be avoided Outcome: Progressing   Problem: Coping: Goal: Level of anxiety will decrease Outcome: Progressing   Problem: Elimination: Goal: Will not experience complications related to bowel motility Outcome: Progressing Goal: Will not experience complications related to urinary retention Outcome: Progressing   Problem: Nutrition: Goal: Adequate nutrition will be maintained Outcome: Progressing   "

## 2024-08-15 NOTE — Progress Notes (Addendum)
 Speech Language Pathology    Patient Details Name: Monica Faulkner MRN: [id number] DOB: 1940/11/30 Today's Date: 08/15/2024 Time:  -     Per RN pt sleepy, up all night last night, Haldol (7:00 pm last night). She will rouse with max stimuli for meds per RN. Not appropriate to see for possible texture upgrade presently. Her O2 requirements have decreased from yesterday (was 45 L [facility] at 75%, this am 20L 60% [facility]) Will continue efforts.     Dustin Olam Bull  08/15/2024, 9:53 AM

## 2024-08-15 NOTE — TOC Initial Note (Signed)
 Transition of Care [facility]) - Initial/Assessment Note    Patient Details  Name: Monica Faulkner MRN: [id number] Date of Birth: Mar 03, 1941  Transition of Care [facility]) CM/SW Contact:    Lauraine FORBES Saa, LCSWA Phone Number: 08/15/2024, 11:40 AM  Clinical Narrative:                  11:40 AM Per chart review, patient is from [facility]. SNF confirmed patient is STR at SNF (used approximately 15 days) and able to return with SNF insurance authorization approval. CSW to send patient's FL2 to SNF upon PT/OT recommendations. Patient has a PCP and insurance. Patient has HH aide history with [facility] form 2022. Patient's preferred pharmacy is [facility] [phone] [facility]. CSW will continue to follow.  Expected Discharge Plan: Skilled Nursing Facility Barriers to Discharge: Continued Medical Work up, English As A Second Language Teacher   Patient Goals and CMS Choice            Expected Discharge Plan and Services In-house Referral: Clinical Social Work   Post Acute Care Choice: Skilled Nursing Facility Living arrangements for the past 2 months: Single Family Home, Skilled Nursing Facility                                      Prior Living Arrangements/Services Living arrangements for the past 2 months: Single Family Home, Skilled Nursing Facility   Patient language and need for interpreter reviewed:: Yes        Need for Family Participation in Patient Care: Yes (Comment) Care giver support system in place?: Yes (comment)   Criminal Activity/Legal Involvement Pertinent to Current Situation/Hospitalization: No - Comment as needed  Activities of Daily Living      Permission Sought/Granted Permission sought to share information with : Facility Medical Sales Representative, Family Supports Permission granted to share information with : No (Contact information on chart)  Share Information with NAME: Telissa Palmisano  Permission granted to share info w AGENCY: [facility] SNF STR  Permission  granted to share info w Relationship: Brother  Permission granted to share info w Contact Information: [phone]  Emotional Assessment   Attitude/Demeanor/Rapport: Unable to Assess Affect (typically observed): Unable to Assess Orientation: : Oriented to Self Alcohol / Substance Use: Not Applicable Psych Involvement: No (comment)  Admission diagnosis:  AKI (acute kidney injury) [N17.9] Anemia, unspecified type [D64.9] Sepsis, due to unspecified organism, unspecified whether acute organ dysfunction present (HCC) [A41.9] Acute hypoxic respiratory failure (HCC) [J96.01] Acute on chronic hypoxic respiratory failure (HCC) [J96.21] Patient Active Problem List   Diagnosis Date Noted   GI bleed 08/12/2024   Symptomatic anemia 08/12/2024   Acute colitis 08/12/2024   Acute pulmonary embolism (HCC) 08/12/2024   Influenza A with pneumonia 08/12/2024   Acute on chronic hypoxic respiratory failure (HCC) 08/12/2024   Acute metabolic encephalopathy 07/22/2024   Controlled type 2 diabetes mellitus without complication, without long-term current use of insulin  (HCC) 07/22/2024   Essential hypertension 07/22/2024   GERD (gastroesophageal reflux disease) 07/22/2024   Dehydration 07/22/2024   Leucocytosis 07/22/2024   Lactic acid acidosis 07/22/2024   AKI (acute kidney injury) 07/22/2024   Hyponatremia 07/02/2018   Macular hole 02/18/2012   PCP:  Verdia Lombard, MD Pharmacy:   [id number] [place], [place] - [address] [facility] [address] [place] [address] Phone: [phone] Fax: [phone]     Social  Drivers of Health (SDOH) Social History: SDOH Screenings   Food Insecurity: Patient Unable To Answer (07/23/2024)  Housing: Patient Unable To Answer (07/23/2024)  Transportation Needs: Patient Unable To Answer (07/23/2024)  Utilities: Patient Unable To Answer (07/23/2024)  Social Connections: Patient Unable  To Answer (07/23/2024)  Tobacco Use: Low Risk (07/22/2024)   SDOH Interventions:     Readmission Risk Interventions     No data to display

## 2024-08-16 DIAGNOSIS — J9621 Acute and chronic respiratory failure with hypoxia: Secondary | ICD-10-CM | POA: Diagnosis not present

## 2024-08-16 LAB — GLUCOSE, CAPILLARY
Glucose-Capillary: 116 mg/dL — ABNORMAL HIGH (ref 70–99)
Glucose-Capillary: 173 mg/dL — ABNORMAL HIGH (ref 70–99)
Glucose-Capillary: 142 mg/dL — ABNORMAL HIGH (ref 70–99)
Glucose-Capillary: 149 mg/dL — ABNORMAL HIGH (ref 70–99)

## 2024-08-16 MED ORDER — POLYETHYLENE GLYCOL 3350 17 G PO PACK
17.0000 g | PACK | Freq: Two times a day (BID) | ORAL | Status: DC
  Administered 2024-08-16 – 2024-08-17 (×2): 17 g via ORAL
  Filled 2024-08-16 (×3): qty 1

## 2024-08-16 MED ORDER — HYDRALAZINE HCL 20 MG/ML IJ SOLN
10.0000 mg | INTRAMUSCULAR | Status: DC | PRN
  Administered 2024-08-17 – 2024-08-19 (×2): 10 mg via INTRAVENOUS
  Filled 2024-08-16 (×2): qty 1

## 2024-08-16 MED ORDER — SENNOSIDES-DOCUSATE SODIUM 8.6-50 MG PO TABS
1.0000 | ORAL_TABLET | Freq: Two times a day (BID) | ORAL | Status: DC
  Administered 2024-08-16 – 2024-08-17 (×3): 1 via ORAL
  Filled 2024-08-16 (×3): qty 1

## 2024-08-16 MED ORDER — FUROSEMIDE 10 MG/ML IJ SOLN
40.0000 mg | Freq: Once | INTRAMUSCULAR | Status: AC
  Administered 2024-08-16: 40 mg via INTRAVENOUS
  Filled 2024-08-16: qty 4

## 2024-08-16 NOTE — Progress Notes (Signed)
 " PROGRESS NOTE    Monica Faulkner  FMW:[id number] DOB: 14-May-1941 DOA: 08/12/2024 PCP: Verdia Lombard, MD   Brief Narrative:  Monica Faulkner is a 83 y.o. female with medical history significant of basal cell carcinoma, type 2 diabetes, GERD, essential hypertension, anxiety disorder, depression.  Patient presents to our facility on 08/12/2024 with diagnosis from rehab with the flu and pneumonia with profound hypoxia and respiratory distress.  Patient initially requiring nonrebreather and heated high flow to maintain sats above 90%.  Patient found to have profound anemia at 4.5 -FOBT positive but dark brown stool noted on exam.  She also has notable subacute PE and age-indeterminate DVT of the left peroneal vein complicating her symptoms. CT also notable for colitis which is likely the origin of her anemia.  Patient was just discharged from our facility on 12/15 in the setting of profound AKI, lactic acidosis and dehydration with metabolic encephalopathy. Discharged to skilled facility at that time, 11 days later she returned with complaints as outlined above.  Assessment & Plan:   Principal Problem:   Acute on chronic hypoxic respiratory failure (HCC) Active Problems:   Hyponatremia   Acute metabolic encephalopathy   Controlled type 2 diabetes mellitus without complication, without long-term current use of insulin  (HCC)   Essential hypertension   GERD (gastroesophageal reflux disease)   GI bleed   Symptomatic anemia   Acute colitis   Acute pulmonary embolism (HCC)   Influenza A with pneumonia   Acute hypoxic respiratory failure Influenza pneumonia Bibasilar pneumonia, rule out bacterial component Subactute PE Age indeterminant DVT, L peroneal vein - Patient's respiratory status is multifactorial in the setting of subacute PE, influenza pneumonia with concern over possible underlying bacterial pneumonia as well -continue Tamiflu  -Continue ceftriaxone  for presumed pneumonia  -azithromycin  discontinued - Patient continues to require moderately high volumes of oxygen to maintain sats above 90% - Patient's baseline oxygen requirements are room air - Patient currently requiring heated high flow 40% 20 L, down from 90% 45 L at intake - At this time patient is unable to initiate anticoagulation in the setting of presumed GI bleed  - Furosemide  IV x 1 - 40mg   Goals of care  - Lengthy discussion with patient's brother [name] over the weekend who is her default power of attorney as he is her only living relative. - We discussed patient's current status and comorbidities and given her age and current illness we will transition to DNR. - At this time the patient does continue to improve somewhat but continues to be tenuous.  He and I discussed that hopefully she continues to improve but discussed the possibility that she may not or she may decline over the next few days at which time we would likely need to discuss further about her care but at this point given her improvement we will continue current treatment regimen and transition patient to a DNR CODE STATUS.  Acute metabolic encephalopathy, multifactorial, POA  Unlikely withdrawal/toxic encephalopathy - At intake patient was noted to be lethargic and poorly arousable, mental status continues to wax and wane oriented to person only this morning  - Continue supplemental treatment as below, avoid CNS depressants, delirium precautions in place - Polypharmacy unlikely although patient is on benzodiazepines low-dose daily she does not appear to be in withdrawals, will continue at this time to avoid precipitation of withdrawals given her tenuous status  Severe acute symptomatic anemia Acute GI bleed Acute colitis - Discussed case with GI, given her tenuous status there  is no indication for consultation at this time as she would not be appropriate or stable for endoscopy - Status post 2 unit PRBC - hemoglobin stabilizing around 8  (baseline 11) - Continue metronidazole  for presumed colitis treatment (on azithromycin  and ceftriaxone  as above for pneumonia)   Essential hypertension:  - Restart home medications as below including atenolol , hydralazine , irbesartan (substituted for home telmisartan)  AKI, resolved: Likely prerenal, downtrending appropriately with supportive care/transfusion  Subacute pulmonary embolism: Appears to be chronic/subacute per imaging -patient not a candidate for anticoagulation at this time given acute anemia and GI bleed as above -likely not a candidate for IVC filter given acute illness/instability and PE already established/subacute versus chronic.  If patient stabilizes and is still unable to tolerate anticoagulation would consider IVC filter given DVT findings but again at this point risk outweighs any benefits.   GERD: Continue with PPIs    DVT prophylaxis: SCDs Start: 08/12/24 2331 no chemical prophylaxis in the setting of GI bleed Code Status:   Code Status: Limited: Do not attempt resuscitation (DNR) -DNR-LIMITED -Do Not Intubate/DNI   Family Communication: Left voicemail for brother [name]  Status is: Inpatient  Dispo: The patient is from: [facility] SN[facility]               Anticipated d/c is to: To be determined              Anticipated d/c date is: To be determined              Patient currently not medically stable for discharge  Consultants:  PCCM GI (discussed there is currently no indication for intervention and would reconsult them if patient's symptoms improved)  Procedures:  None  Antimicrobials:  Ceftriaxone , metronidazole   Subjective: No acute issues or events overnight, respiratory status continues to improve, mental status also improving as above  Objective: Vitals:   08/16/24 0300 08/16/24 0400 08/16/24 0500 08/16/24 0600  BP: (!) 173/57 (!) 167/70 (!) 190/77 (!) 150/55  Pulse: 71 70 76 71  Resp: (!) 25 18 20 18   Temp: 98.2 F (36.8 C) 98.2 F (36.8 C) 98.4 F  (36.9 C) 98.6 F (37 C)  TempSrc:  Bladder    SpO2: 97% 98% 94% 98%  Weight:      Height:        Intake/Output Summary (Last 24 hours) at 08/16/2024 0654 Last data filed at 08/16/2024 0600 Gross per 24 hour  Intake 300 ml  Output 1365 ml  Net -1065 ml   Filed Weights   08/12/24 1559 08/14/24 0400  Weight: 60.5 kg 71.6 kg    Examination:  General: Resting comfortably, more awake and alert today-oriented to self and hospital only HEENT:  Normocephalic atraumatic.  Sclerae nonicteric, noninjected.  Extraocular movements intact bilaterally. Lungs: Coarse rhonchi bilaterally without wheeze Heart: Tachycardic regular rhythm without overt murmurs. Abdomen:  Soft, nontender, nondistended.  Without guarding or rebound. Extremities: Without cyanosis, or clubbing; mild diffuse edema 1+  Data Reviewed: I have personally reviewed following labs and imaging studies  CBC: Recent Labs  Lab 08/12/24 1618 08/13/24 0023 08/13/24 0902 08/13/24 1113 08/13/24 1846 08/14/24 0048 08/15/24 0210  WBC 8.8   < > 10.2 10.5 8.3 7.3 6.8  NEUTROABS 7.1  --   --   --   --   --   --   HGB 4.5*   < > 7.9* 8.1* 8.0* 7.9* 7.8*  HCT 14.1*   < > 23.4* 23.7* 23.9* 23.7* 23.1*  MCV 102.2*   < >  90.7 91.2 92.3 92.6 92.4  PLT 371   < > 305 315 292 277 259   < > = values in this interval not displayed.   Basic Metabolic Panel: Recent Labs  Lab 08/12/24 1618 08/13/24 0902 08/14/24 0709 08/15/24 0210  NA 133* 134* 136 138  K 4.8 4.3 3.5 3.4*  CL 102 104 103 104  CO2 23 22 23 25   GLUCOSE 148* 125* 242* 132*  BUN 45* 34* 19 16  CREATININE 1.33* 1.18* 0.92 0.80  CALCIUM 8.9 8.7* 8.3* 8.5*  MG 1.9  --  1.7  --   PHOS  --   --  2.9  --    GFR: Estimated Creatinine Clearance: 53.8 mL/min (by C-G formula based on SCr of 0.8 mg/dL).  Liver Function Tests: Recent Labs  Lab 08/12/24 1618 08/13/24 0902  AST 28 32  ALT 29 29  ALKPHOS 35* 42  BILITOT 0.4 0.3  PROT 5.0* 4.8*  ALBUMIN 3.2* 3.0*    BNP (last 3 results) Recent Labs    08/12/24 1618  PROBNP 5,942.0*   CBG: Recent Labs  Lab 08/14/24 2108 08/15/24 0751 08/15/24 1144 08/15/24 1558 08/15/24 2117  GLUCAP 132* 144* 145* 151* 130*   Thyroid  Function Tests: No results for input(s): TSH, T4TOTAL, FREET4, T3FREE, THYROIDAB in the last 72 hours.  Sepsis Labs: Recent Labs  Lab 08/12/24 1617  LATICACIDVEN 1.1   Recent Results (from the past 240 hours)  Blood culture (routine x 2)     Status: None (Preliminary result)   Collection Time: 08/12/24  4:09 PM   Specimen: BLOOD  Result Value Ref Range Status   Specimen Description BLOOD SITE NOT SPECIFIED  Final   Special Requests   Final    BOTTLES DRAWN AEROBIC AND ANAEROBIC Blood Culture adequate volume   Culture   Final    NO GROWTH 4 DAYS Performed at [facility], [address] [place], [place] [address]    Report Status PENDING  Incomplete  Blood culture (routine x 2)     Status: None (Preliminary result)   Collection Time: 08/12/24  4:14 PM   Specimen: BLOOD LEFT FOREARM  Result Value Ref Range Status   Specimen Description BLOOD LEFT FOREARM  Final   Special Requests   Final    BOTTLES DRAWN AEROBIC AND ANAEROBIC Blood Culture adequate volume   Culture   Final    NO GROWTH 4 DAYS Performed at [facility] Lab, [address] [place], [place] [address]    Report Status PENDING  Incomplete  Resp panel by RT-PCR (RSV, Flu A&B, Covid) Anterior Nasal Swab     Status: Abnormal   Collection Time: 08/12/24  4:42 PM   Specimen: Anterior Nasal Swab  Result Value Ref Range Status   SARS Coronavirus 2 by RT PCR NEGATIVE NEGATIVE Final   Influenza A by PCR POSITIVE (A) NEGATIVE Final   Influenza B by PCR NEGATIVE NEGATIVE Final    Comment: (NOTE) The Xpert Xpress SARS-CoV-2/FLU/RSV plus assay is intended as an aid in the diagnosis of influenza from Nasopharyngeal swab specimens and should not be used as a sole basis for treatment.  Nasal washings and aspirates are unacceptable for Xpert Xpress SARS-CoV-2/FLU/RSV testing.  Fact Sheet for Patients: bloggercourse.com  Fact Sheet for Healthcare Providers: seriousbroker.it  This test is not yet approved or cleared by the [place]  FDA and has been authorized for detection and/or diagnosis of SARS-CoV-2 by FDA under an Emergency Use Authorization (  EUA). This EUA will remain in effect (meaning this test can be used) for the duration of the COVID-19 declaration under Section 564(b)(1) of the Act, 21 U.S.C. section 360bbb-3(b)(1), unless the authorization is terminated or revoked.     Resp Syncytial Virus by PCR NEGATIVE NEGATIVE Final    Comment: (NOTE) Fact Sheet for Patients: bloggercourse.com  Fact Sheet for Healthcare Providers: seriousbroker.it  This test is not yet approved or cleared by the [place]  FDA and has been authorized for detection and/or diagnosis of SARS-CoV-2 by FDA under an Emergency Use Authorization (EUA). This EUA will remain in effect (meaning this test can be used) for the duration of the COVID-19 declaration under Section 564(b)(1) of the Act, 21 U.S.C. section 360bbb-3(b)(1), unless the authorization is terminated or revoked.  Performed at [facility] Lab, [address] [place], [place] [address]   Urine Culture     Status: None   Collection Time: 08/12/24  8:34 PM   Specimen: Urine, Catheterized  Result Value Ref Range Status   Specimen Description URINE, CATHETERIZED  Final   Special Requests NONE  Final   Culture   Final    NO GROWTH Performed at [facility] Lab, [address] [place], [place] [address]    Report Status 08/14/2024 FINAL  Final  MRSA Next Gen by PCR, Nasal     Status: None   Collection Time: 08/12/24 11:21 PM   Specimen: Nasal Mucosa; Nasal Swab  Result Value Ref Range Status   MRSA by PCR  Next Gen NOT DETECTED NOT DETECTED Final    Comment: (NOTE) The GeneXpert MRSA Assay (FDA approved for NASAL specimens only), is one component of a comprehensive MRSA colonization surveillance program. It is not intended to diagnose MRSA infection nor to guide or monitor treatment for MRSA infections. Test performance is not FDA approved in patients less than 17 years old. Performed at [facility] Lab, [address], [place], [place] [address]     Radiology Studies: No results found.   Scheduled Meds:  sodium chloride    Intravenous Once   atenolol   25 mg Oral QPM   Chlorhexidine  Gluconate Cloth  6 each Topical Daily   clonazePAM   0.5 mg Oral QHS   escitalopram   10 mg Oral Daily   hydrALAZINE   100 mg Oral Q8H   insulin  aspart  0-5 Units Subcutaneous QHS   insulin  aspart  0-9 Units Subcutaneous TID WC   irbesartan   300 mg Oral Daily   oseltamivir   30 mg Oral Daily   pantoprazole  (PROTONIX ) IV  40 mg Intravenous Q12H   Continuous Infusions:  cefTRIAXone  (ROCEPHIN )  IV Stopped (08/16/24 0109)   metronidazole  Stopped (08/15/24 2224)     LOS: 4 days   Time spent:   Elsie JAYSON Montclair, DO [facility]  If 7PM-7AM, please contact night-coverage [email or web address]  08/16/2024, 6:54 AM      "

## 2024-08-16 NOTE — Plan of Care (Signed)
  Problem: Education: Goal: Knowledge of General Education information will improve Description: Including pain rating scale, medication(s)/side effects and non-pharmacologic comfort measures Outcome: Progressing   Problem: Health Behavior/Discharge Planning: Goal: Ability to manage health-related needs will improve Outcome: Progressing   Problem: Clinical Measurements: Goal: Ability to maintain clinical measurements within normal limits will improve Outcome: Progressing Goal: Will remain free from infection Outcome: Progressing Goal: Diagnostic test results will improve Outcome: Progressing Goal: Respiratory complications will improve Outcome: Progressing Goal: Cardiovascular complication will be avoided Outcome: Progressing   Problem: Nutrition: Goal: Adequate nutrition will be maintained Outcome: Progressing   Problem: Coping: Goal: Level of anxiety will decrease Outcome: Progressing   Problem: Elimination: Goal: Will not experience complications related to bowel motility Outcome: Progressing Goal: Will not experience complications related to urinary retention Outcome: Progressing   Problem: Safety: Goal: Ability to remain free from injury will improve Outcome: Progressing   Problem: Pain Managment: Goal: General experience of comfort will improve and/or be controlled Outcome: Progressing

## 2024-08-16 NOTE — TOC Progression Note (Signed)
 Transition of Care [facility]) - Progression Note    Patient Details  Name: Monica Faulkner MRN: [id number] Date of Birth: 04/07/1941  Transition of Care [facility]) CM/SW Contact  Lauraine FORBES Saa, LCSWA Phone Number: 08/16/2024, 12:24 PM  Clinical Narrative:     12:24 PM Per progressions, PT/OT orders were placed for evaluation. Per chart review, SLP recommended SNF level rehab. CSW to submit patient's FL2 to [facility] upon PT/OT recommendations. CSW will continue to follow.  Expected Discharge Plan: Skilled Nursing Facility Barriers to Discharge: Continued Medical Work up, English As A Second Language Teacher               Expected Discharge Plan and Services In-house Referral: Clinical Social Work   Post Acute Care Choice: Skilled Nursing Facility Living arrangements for the past 2 months: Single Family Home, Skilled Nursing Facility                                       Social Drivers of Health (SDOH) Interventions SDOH Screenings   Food Insecurity: Patient Unable To Answer (07/23/2024)  Housing: Patient Unable To Answer (07/23/2024)  Transportation Needs: Patient Unable To Answer (07/23/2024)  Utilities: Patient Unable To Answer (07/23/2024)  Social Connections: Patient Unable To Answer (07/23/2024)  Tobacco Use: Low Risk (07/22/2024)    Readmission Risk Interventions     No data to display

## 2024-08-16 NOTE — Progress Notes (Signed)
 Speech Language Pathology Treatment: Dysphagia  Patient Details Name: Monica Faulkner MRN: [id number] DOB: [birth year] Today's Date: 08/16/2024 Time: 0920-0931 SLP Time Calculation (min) (ACUTE ONLY): 11 min  Assessment / Plan / Recommendation Clinical Impression  Plan: continue full liquids/thin consistency, oral meds as tolerated. SLP will continue to follow.  Patient seen by SLP for skilled treatment focused on dysphagia goals. Per RN, she is frequently requesting water  and has had two cups so far this morning. She also continues to wax and wane with alertness and has mitt restraints due to confusion, pulling at lines, leads but has not been overly agitated. Patient's oxygen requirement has gone down and currently at 20L via HHFNC at 40% FiO2. SLP assessed her toleration at bedside with PO's of puree/pudding and thin liquids (water ) via straw sips. Patient with inconsistent, delayed cough response during PO intake but also exhibited an intermittent cough in absence of PO's. SpO2 remained above 93%, RR remained at or below 20. She does not appear ready for diet advancement but appears to be progressing overall.   HPI HPI: Monica Faulkner is an 83 y.o. female who presented to the hospital from SNF on 08/12/24 with diagnosis of flu and PNA. She was recently admitted 12/5-12/15/25 with acute metabolic encephalopathy and severe dehydration with AKI, lactic acidosis as well and sent to SNF upon discharge. For current admission, she developed severe respiratory distress, placed on NRB for SpO2 90%; she was barely arousable; hemoglobin was 4.5 (was 13.6 at discharge two weeks prior). CT angio/chest/PE showed Superimposed consolidation  within the peripheral right lower lobe may reflect changes of acute infection or aspiration. CXR showed new right basilar airspace opacity; suspicious for PNA. She was advanced from NRB to [facility] on 45L and was admitted with acute metabolic encephalopathy, multifactorial,  influenza PNA, acute GI bleed, subacute PE. PMH: GERD, DM-2, HTN, anxiety disorder, depression.      SLP Plan  Continue with current plan of care        Swallow Evaluation Recommendations         Recommendations  Diet recommendations: Thin liquid;Other(comment) (full liquids) Liquids provided via: Straw;Cup Medication Administration: Other (Comment) (as tolerated) Supervision: Full supervision/cueing for compensatory strategies;Staff to assist with self feeding Compensations: Slow rate;Small sips/bites Postural Changes and/or Swallow Maneuvers: Seated upright 90 degrees                  Oral care BID;Staff/trained caregiver to provide oral care   Frequent or constant Supervision/Assistance Dysphagia, unspecified (R13.10)     Continue with current plan of care     Norleen IVAR Blase, MA, CCC-SLP Speech Therapy   08/16/2024, 9:37 AM

## 2024-08-17 ENCOUNTER — Inpatient Hospital Stay (HOSPITAL_COMMUNITY)

## 2024-08-17 DIAGNOSIS — J9621 Acute and chronic respiratory failure with hypoxia: Secondary | ICD-10-CM | POA: Diagnosis not present

## 2024-08-17 LAB — CBC
HCT: 25 % — ABNORMAL LOW (ref 36.0–46.0)
Hemoglobin: 8.5 g/dL — ABNORMAL LOW (ref 12.0–15.0)
MCH: 30.4 pg (ref 26.0–34.0)
MCHC: 34 g/dL (ref 30.0–36.0)
MCV: 89.3 fL (ref 80.0–100.0)
Platelets: 284 K/uL (ref 150–400)
RBC: 2.8 MIL/uL — ABNORMAL LOW (ref 3.87–5.11)
RDW: 15.2 % (ref 11.5–15.5)
WBC: 6.1 K/uL (ref 4.0–10.5)
nRBC: 0 % (ref 0.0–0.2)

## 2024-08-17 LAB — BASIC METABOLIC PANEL WITH GFR
Anion gap: 8 (ref 5–15)
Anion gap: 7 (ref 5–15)
BUN: 11 mg/dL (ref 8–23)
BUN: 12 mg/dL (ref 8–23)
CO2: 31 mmol/L (ref 22–32)
CO2: 27 mmol/L (ref 22–32)
Calcium: 8 mg/dL — ABNORMAL LOW (ref 8.9–10.3)
Calcium: 7.8 mg/dL — ABNORMAL LOW (ref 8.9–10.3)
Chloride: 96 mmol/L — ABNORMAL LOW (ref 98–111)
Chloride: 95 mmol/L — ABNORMAL LOW (ref 98–111)
Creatinine, Ser: 0.7 mg/dL (ref 0.44–1.00)
Creatinine, Ser: 0.73 mg/dL (ref 0.44–1.00)
GFR, Estimated: >60 mL/min
GFR, Estimated: >60 mL/min
Glucose, Bld: 126 mg/dL — ABNORMAL HIGH (ref 70–99)
Glucose, Bld: 165 mg/dL — ABNORMAL HIGH (ref 70–99)
Potassium: 2.2 mmol/L — CL (ref 3.5–5.1)
Potassium: 3.7 mmol/L (ref 3.5–5.1)
Sodium: 135 mmol/L (ref 135–145)
Sodium: 130 mmol/L — ABNORMAL LOW (ref 135–145)

## 2024-08-17 LAB — CULTURE, BLOOD (ROUTINE X 2)
Culture: NO GROWTH
Culture: NO GROWTH
Special Requests: ADEQUATE
Special Requests: ADEQUATE

## 2024-08-17 LAB — GLUCOSE, CAPILLARY
Glucose-Capillary: 186 mg/dL — ABNORMAL HIGH (ref 70–99)
Glucose-Capillary: 166 mg/dL — ABNORMAL HIGH (ref 70–99)
Glucose-Capillary: 156 mg/dL — ABNORMAL HIGH (ref 70–99)
Glucose-Capillary: 135 mg/dL — ABNORMAL HIGH (ref 70–99)

## 2024-08-17 LAB — MAGNESIUM: Magnesium: 1.5 mg/dL — ABNORMAL LOW (ref 1.7–2.4)

## 2024-08-17 MED ORDER — MAGNESIUM SULFATE 2 GM/50ML IV SOLN
2.0000 g | Freq: Once | INTRAVENOUS | Status: AC
  Administered 2024-08-17: 2 g via INTRAVENOUS
  Filled 2024-08-17: qty 50

## 2024-08-17 MED ORDER — POTASSIUM CHLORIDE 10 MEQ/100ML IV SOLN
10.0000 meq | INTRAVENOUS | Status: AC
  Administered 2024-08-17 (×4): 10 meq via INTRAVENOUS
  Filled 2024-08-17 (×4): qty 100

## 2024-08-17 MED ORDER — POTASSIUM CHLORIDE 20 MEQ PO PACK
40.0000 meq | PACK | Freq: Once | ORAL | Status: AC
  Administered 2024-08-17: 40 meq via ORAL
  Filled 2024-08-17: qty 2

## 2024-08-17 MED ORDER — POTASSIUM CHLORIDE 20 MEQ PO PACK: 40.0000 meq | PACK | Freq: Three times a day (TID) | ORAL | Status: DC

## 2024-08-17 MED ORDER — FUROSEMIDE 10 MG/ML IJ SOLN
40.0000 mg | Freq: Once | INTRAMUSCULAR | Status: AC
  Administered 2024-08-17: 40 mg via INTRAVENOUS
  Filled 2024-08-17: qty 4

## 2024-08-17 MED ORDER — POTASSIUM CHLORIDE 20 MEQ PO PACK
40.0000 meq | PACK | ORAL | Status: AC
  Administered 2024-08-17 (×2): 40 meq via ORAL
  Filled 2024-08-17 (×2): qty 2

## 2024-08-17 MED ORDER — POTASSIUM CHLORIDE 20 MEQ PO PACK
40.0000 meq | PACK | ORAL | Status: DC
  Administered 2024-08-17: 40 meq via ORAL
  Filled 2024-08-17: qty 2

## 2024-08-17 MED ORDER — HYDRALAZINE HCL 50 MG PO TABS
50.0000 mg | ORAL_TABLET | Freq: Three times a day (TID) | ORAL | Status: DC
  Administered 2024-08-17 – 2024-08-22 (×15): 50 mg via ORAL
  Filled 2024-08-17 (×15): qty 1

## 2024-08-17 NOTE — TOC Progression Note (Signed)
 Transition of Care [facility]) - Progression Note    Patient Details  Name: Monica Faulkner MRN: [id number] Date of Birth: Aug 05, 1941  Transition of Care [facility]) CM/SW Contact  Lauraine FORBES Saa, LCSWA Phone Number: 08/17/2024, 12:59 PM  Clinical Narrative:     12:59 PM CSW sent patient's FL2 to [facility]. CSW to submit SNF insurance authorization once patient is off HHF/HFNC and is medically ready for discharge. CSW will continue to follow.  Expected Discharge Plan: Skilled Nursing Facility Barriers to Discharge: Continued Medical Work up, English As A Second Language Teacher               Expected Discharge Plan and Services In-house Referral: Clinical Social Work   Post Acute Care Choice: Skilled Nursing Facility Living arrangements for the past 2 months: Single Family Home, Skilled Nursing Facility                                       Social Drivers of Health (SDOH) Interventions SDOH Screenings   Food Insecurity: Patient Unable To Answer (07/23/2024)  Housing: Patient Unable To Answer (07/23/2024)  Transportation Needs: Patient Unable To Answer (07/23/2024)  Utilities: Patient Unable To Answer (07/23/2024)  Social Connections: Patient Unable To Answer (07/23/2024)  Tobacco Use: Low Risk (07/22/2024)    Readmission Risk Interventions     No data to display

## 2024-08-17 NOTE — Evaluation (Signed)
 Occupational Therapy Evaluation Patient Details Name: Monica Faulkner MRN: [id number] DOB: 1941-06-10 Today's Date: 08/17/2024   History of Present Illness   Pt is an 83 y/o F presenting to [facility] on 08/12/24 from rehab facility due to flue/pneumonia with profound hypoxia and respiratory distress.Pt found to be anemic as well. Subacute PE and age indeterminate DVT of the L peroneal vein found as well. CT positive for colitis which MD believes is most likely reason for anemia. Recently discharged on 12/15 in the setting of AKI, lactic acidosis and dehydration with metabolic encephalopathy.   PMHx: HTN, DM, anxiety, basal cell carcinoma, GERD, anxiety, depression     Clinical Impressions Pt seen for OT evaluation this AM. Per notes, pt from SNF for short-term rehab. Pt is oriented to person/place, poor historian. Functionally, she required mod/max A +2 for sit<>stand and squat pivot transfers. Total A for LB ADL and min A for seated UB ADL. She has a flat affect, is fearful of falling, and demonstrates poor sequencing. VSS throughout on 20L / 45% FiO2 HHFNC.   Pt is currently functioning below baseline and would benefit from ongoing acute OT services to progress towards safe discharge and to facilitate return to prior level of function. Current recommendation is post-acute rehab (< 3 hours/day).     If plan is discharge home, recommend the following:   A lot of help with bathing/dressing/bathroom;Assistance with cooking/housework;Direct supervision/assist for medications management;Assist for transportation;Direct supervision/assist for financial management;Help with stairs or ramp for entrance;Supervision due to cognitive status;A little help with walking and/or transfers     Functional Status Assessment   Patient has had a recent decline in their functional status and demonstrates the ability to make significant improvements in function in a reasonable and predictable amount of time.      Equipment Recommendations   Other (comment) (defer)     Recommendations for Other Services         Precautions/Restrictions   Precautions Precautions: Fall Recall of Precautions/Restrictions: Impaired Precaution/Restrictions Comments: monitor BP Restrictions Weight Bearing Restrictions Per Provider Order: No     Mobility Bed Mobility Overal bed mobility: Needs Assistance Bed Mobility: Supine to Sit     Supine to sit: Mod assist, +2 for physical assistance, +2 for safety/equipment     General bed mobility comments: assist for trunk elevation and seated scooting to EOB, cues for hand placement/sequencing to come to EOB    Transfers Overall transfer level: Needs assistance Equipment used: Rolling walker (2 wheels) Transfers: Sit to/from Stand Sit to Stand: Mod assist, +2 physical assistance, +2 safety/equipment   Squat pivot transfers: Max assist, +2 safety/equipment, +2 physical assistance       General transfer comment: Pt unable to fully come to upright posture despite cues for forward gaze. A for LE mgmt, WS, and safe approach to b/s chair.      Balance Overall balance assessment: Needs assistance Sitting-balance support: Single extremity supported, Feet supported Sitting balance-Leahy Scale: Poor Sitting balance - Comments: initially with mild R lateral lean, cues for upright posture correction at EOB   Standing balance support: Bilateral upper extremity supported, During functional activity, Reliant on assistive device for balance Standing balance-Leahy Scale: Poor Standing balance comment: stood initially with RW then B HHA, reliant on external support in stance                           ADL either performed or assessed with clinical judgement   ADL  Overall ADL's : Needs assistance/impaired Eating/Feeding: Set up;Sitting   Grooming: Sitting;Wash/dry hands;Wash/dry face           Upper Body Dressing : Minimal assistance   Lower  Body Dressing: Total assistance Lower Body Dressing Details (indicate cue type and reason): attempted via figure four technique, needs A to complete d/t general weakness and poor flexibility             Functional mobility during ADLs: Maximal assistance;+2 for physical assistance;+2 for safety/equipment       Vision         Perception         Praxis         Pertinent Vitals/Pain Pain Assessment Pain Assessment: Faces Faces Pain Scale: Hurts a little bit Pain Location: R UE and generalized discomfort aches/pains Pain Descriptors / Indicators: Discomfort, Sore, Aching Pain Intervention(s): Limited activity within patient's tolerance, Monitored during session     Extremity/Trunk Assessment Upper Extremity Assessment Upper Extremity Assessment: Generalized weakness   Lower Extremity Assessment Lower Extremity Assessment: Generalized weakness   Cervical / Trunk Assessment Cervical / Trunk Assessment: Kyphotic   Communication Communication Communication: Impaired Factors Affecting Communication: Reduced clarity of speech   Cognition Arousal: Alert Behavior During Therapy: WFL for tasks assessed/performed Cognition: Cognition impaired   Orientation impairments:  (pt states [facility]) Awareness: Intellectual awareness impaired Memory impairment (select all impairments): Short-term memory, Working civil service fast streamer, Non-declarative long-term memory, Geneticist, Molecular long-term memory Attention impairment (select first level of impairment): Focused attention Executive functioning impairment (select all impairments): Initiation, Organization, Sequencing, Reasoning, Problem solving OT - Cognition Comments: delayed processing, flat affect                 Following commands: Impaired Following commands impaired: Follows one step commands with increased time, Follows multi-step commands inconsistently     Cueing  General Comments   Cueing Techniques: Verbal cues;Gestural  cues  SpO2 maintained ~94-97% on 20L / 45% FiO2 HHFNC, HR 80-90s, see assessment for BP readings (suspect + orthostatics, but pt unable to tolerate standing long enough to obtain BP)   Exercises     Shoulder Instructions      Home Living Family/patient expects to be discharged to:: Skilled nursing facility Living Arrangements: Alone                               Additional Comments: per notes, pt from SNF rehab facility PTA      Prior Functioning/Environment Prior Level of Function : Patient poor historian/Family not available             Mobility Comments: pt reports she started walking with a RW at rehab (she is a poor historian) ADLs Comments: no family/caregiver present to confirm PLOF (pt poor historian)    OT Problem List: Decreased strength;Decreased activity tolerance;Impaired balance (sitting and/or standing);Decreased cognition;Decreased safety awareness   OT Treatment/Interventions: Self-care/ADL training;Therapeutic exercise;Therapeutic activities;Cognitive remediation/compensation;Patient/family education;Balance training;DME and/or AE instruction      OT Goals(Current goals can be found in the care plan section)   Acute Rehab OT Goals Patient Stated Goal: pt did not state OT Goal Formulation: With patient Time For Goal Achievement: 08/31/24 Potential to Achieve Goals: Fair   OT Frequency:  Min 1X/week    Co-evaluation PT/OT/SLP Co-Evaluation/Treatment: Yes Reason for Co-Treatment: Complexity of the patient's impairments (multi-system involvement);For patient/therapist safety;To address functional/ADL transfers PT goals addressed during session: Mobility/safety with mobility;Balance OT goals addressed during session: ADL's  and self-care      AM-PAC OT 6 Clicks Daily Activity     Outcome Measure Help from another person eating meals?: A Little Help from another person taking care of personal grooming?: A Little Help from another  person toileting, which includes using toliet, bedpan, or urinal?: Total Help from another person bathing (including washing, rinsing, drying)?: A Lot Help from another person to put on and taking off regular upper body clothing?: A Lot Help from another person to put on and taking off regular lower body clothing?: Total 6 Click Score: 12   End of Session Equipment Utilized During Treatment: Gait belt;Rolling walker (2 wheels);Oxygen Nurse Communication: Mobility status  Activity Tolerance: Patient tolerated treatment well Patient left: in chair;with call bell/phone within reach;with chair alarm set;with nursing/sitter in room  OT Visit Diagnosis: Unsteadiness on feet (R26.81);Other abnormalities of gait and mobility (R26.89);Other symptoms and signs involving cognitive function                Time: 1137-1207 OT Time Calculation (min): 30 min Charges:  OT General Charges $OT Visit: 1 Visit OT Evaluation $OT Eval Moderate Complexity: 1 Mod  Llewellyn Schoenberger M. [place], OTR/L [facility] Acute Rehabilitation Services [phone] Secure Chat Preferred  Karris Deangelo 08/17/2024, 2:19 PM

## 2024-08-17 NOTE — Evaluation (Signed)
 Physical Therapy Evaluation Patient Details Name: Monica Faulkner MRN: [id number] DOB: November 01, 1940 Today's Date: 08/17/2024  History of Present Illness  Pt is an 83 y/o presenting to [facility] on 12/26 from rehab due to flue/pneumonia with profound hypoxia and respiratory distress.Pt found to be anemic as well. Subacute PE and age indeterminate DVT of the L peroneal vein found as well. CT positive for colitis which MD believes is most likely reason for anemia. Recently discharged on 12/15 in the setting of AKI, lactic acidosis and dehydration with metabolic encephalopathy.   PMHx: HTN, DM, anxiety, basal cell carcinoma, GERD, anxiety, depression  Clinical Impression  Pt currently Mod A +2 for bed mobility and sit to stand. Pt was Max A for squat pivot transfer with second person for safety. Pt with very flexed posture in standing and unable to correct with heavy mutli modal cues. Difficult for pt to perform motor planning with heavy multi modal step by step cues to perform step pivot transfer to recliner from EOB requiring change from step pivot to squat pivot. Due to pt current functional status, home set up and available assistance at home recommending skilled physical therapy services < 3 hours/day in order to address strength, balance and functional mobility to decrease risk for falls, injury, immobility, skin break down and re-hospitalization.          If plan is discharge home, recommend the following: A lot of help with bathing/dressing/bathroom;Assistance with cooking/housework;Direct supervision/assist for financial management;Direct supervision/assist for medications management;Assist for transportation;Help with stairs or ramp for entrance;Supervision due to cognitive status;A lot of help with walking and/or transfers   Can travel by private vehicle   No    Equipment Recommendations None recommended by PT     Functional Status Assessment Patient has had a recent decline in their  functional status and demonstrates the ability to make significant improvements in function in a reasonable and predictable amount of time.     Precautions / Restrictions Precautions Precautions: Fall Recall of Precautions/Restrictions: Impaired Precaution/Restrictions Comments: watch BP Restrictions Weight Bearing Restrictions Per Provider Order: No      Mobility  Bed Mobility Overal bed mobility: Needs Assistance Bed Mobility: Supine to Sit     Supine to sit: Mod assist, +2 for physical assistance, +2 for safety/equipment     General bed mobility comments: Mod A for LE to EOB, trunk to midline and scooting toward EOB. Pt attempting to assist with bil UE and use of bil LE    Transfers Overall transfer level: Needs assistance Equipment used: Rolling walker (2 wheels) Transfers: Sit to/from Stand Sit to Stand: Mod assist, +2 physical assistance, +2 safety/equipment Stand pivot transfers: Max assist   Squat pivot transfers: Max assist, +2 safety/equipment     General transfer comment: Mod +2 for squat stance sit to stand without full flexion; cues for glute squeeze and to bring pelvis forward without good results. Max A squat pivot transfer without AD pt able to take one step forward with RLE prior to being assisted with squat pivot transfer with significant trunk flexion. Difficult for pt to perform motor planning with heavy multi modal step by step cues to perform step pivot transfer to recliner from EOB requiring change from step pivot to squat pivot.    Ambulation/Gait     Pre-gait activities: pt took one step forward with RLE with mutli modal cues including anterior/L wgt shift in order to facilitate stepping      Balance Overall balance assessment: Needs assistance Sitting-balance  support: Single extremity supported, Feet supported Sitting balance-Leahy Scale: Poor Sitting balance - Comments: initially poor but improved to fair with time initially R lean Postural  control: Right lateral lean, Posterior lean Standing balance support: Bilateral upper extremity supported, During functional activity, Reliant on assistive device for balance Standing balance-Leahy Scale: Poor Standing balance comment: reliant on RW/External support       Pertinent Vitals/Pain Pain Assessment Pain Assessment: Faces Faces Pain Scale: Hurts a little bit Facial Expression: Relaxed, neutral Body Movements: Protection Muscle Tension: Relaxed Compliance with ventilator (intubated pts.): N/A Vocalization (extubated pts.): Talking in normal tone or no sound CPOT Total: 1 Pain Location: R arm and generalized Pain Descriptors / Indicators: Discomfort, Sore Pain Intervention(s): Monitored during session, Repositioned    Home Living Family/patient expects to be discharged to:: Skilled nursing facility Living Arrangements: Alone       Additional Comments: per notes pt from home alone    Prior Function Prior Level of Function : Patient poor historian/Family not available             Mobility Comments: pt was reporting she was starting to ambulate at rehab ADLs Comments: no family present to confirm PLOF     Extremity/Trunk Assessment   Upper Extremity Assessment Upper Extremity Assessment: Defer to OT evaluation    Lower Extremity Assessment Lower Extremity Assessment: Generalized weakness    Cervical / Trunk Assessment Cervical / Trunk Assessment: Kyphotic  Communication   Communication Communication: Impaired Factors Affecting Communication: Reduced clarity of speech    Cognition Arousal: Alert Behavior During Therapy: WFL for tasks assessed/performed   PT - Cognitive impairments: Initiation, Problem solving, Safety/Judgement     Following commands: Impaired Following commands impaired: Follows one step commands with increased time, Follows multi-step commands inconsistently     Cueing Cueing Techniques: Verbal cues, Gestural cues      General Comments General comments (skin integrity, edema, etc.): O2 sats above 92% throughout activity. BP 138/61 in reclined position, 139/59 in sitting adn 123/76 after standing. Unable to get standing BP due to fatigue/dizziness pt unable to stand long enough.        Assessment/Plan    PT Assessment Patient needs continued PT services  PT Problem List Decreased strength;Decreased range of motion;Decreased balance;Decreased activity tolerance;Decreased mobility;Decreased coordination;Decreased cognition;Decreased knowledge of use of DME;Decreased safety awareness;Decreased knowledge of precautions       PT Treatment Interventions DME instruction;Gait training;Stair training;Therapeutic activities;Functional mobility training;Therapeutic exercise;Balance training;Neuromuscular re-education;Cognitive remediation;Patient/family education    PT Goals (Current goals can be found in the Care Plan section)  Acute Rehab PT Goals Patient Stated Goal: to leave the hospital PT Goal Formulation: With patient Time For Goal Achievement: 08/31/24 Potential to Achieve Goals: Fair    Frequency Min 1X/week     Co-evaluation PT/OT/SLP Co-Evaluation/Treatment: Yes Reason for Co-Treatment: Complexity of the patient's impairments (multi-system involvement);For patient/therapist safety;To address functional/ADL transfers PT goals addressed during session: Mobility/safety with mobility;Balance OT goals addressed during session: Strengthening/ROM;ADL's and self-care       AM-PAC PT 6 Clicks Mobility  Outcome Measure Help needed turning from your back to your side while in a flat bed without using bedrails?: A Lot Help needed moving from lying on your back to sitting on the side of a flat bed without using bedrails?: A Lot Help needed moving to and from a bed to a chair (including a wheelchair)?: A Lot Help needed standing up from a chair using your arms (e.g., wheelchair or bedside chair)?: A  Lot  Help needed to walk in hospital room?: Total Help needed climbing 3-5 steps with a railing? : Total 6 Click Score: 10    End of Session Equipment Utilized During Treatment: Gait belt Activity Tolerance: Patient tolerated treatment well Patient left: in chair;with call bell/phone within reach;with chair alarm set Nurse Communication: Mobility status PT Visit Diagnosis: Other abnormalities of gait and mobility (R26.89)    Time: 1137-1208 PT Time Calculation (min) (ACUTE ONLY): 31 min   Charges:   PT Evaluation $PT Eval Low Complexity: 1 Low   PT General Charges $$ ACUTE PT VISIT: 1 Visit       Dorothyann Maier, DPT, CLT  Acute Rehabilitation Services Office: [phone] (Secure chat preferred)   Dorothyann VEAR Maier 08/17/2024, 12:32 PM

## 2024-08-17 NOTE — NC FL2 (Signed)
 " [place]  MEDICAID FL2 LEVEL OF CARE FORM     IDENTIFICATION  Patient Name: Monica Faulkner Birthdate: [birth year] Sex: female Admission Date (Current Location): 08/12/2024  [facility] and [place] Number:  Producer, Television/film/video and Address:  [facility], [address] [place], [place] [address]      Provider Number: [id number]  Attending Physician Name and Address:  Fairy Frames, MD  Relative Name and Phone Number:  [name] Mattock; Brother; [phone]    Current Level of Care: Hospital Recommended Level of Care: Skilled Nursing Facility Prior Approval Number:    Date Approved/Denied:   PASRR Number: [id number]  Discharge Plan: SNF    Current Diagnoses: Patient Active Problem List   Diagnosis Date Noted   GI bleed 08/12/2024   Symptomatic anemia 08/12/2024   Acute colitis 08/12/2024   Acute pulmonary embolism (HCC) 08/12/2024   Influenza A with pneumonia 08/12/2024   Acute on chronic hypoxic respiratory failure (HCC) 08/12/2024   Acute metabolic encephalopathy 07/22/2024   Controlled type 2 diabetes mellitus without complication, without long-term current use of insulin  (HCC) 07/22/2024   Essential hypertension 07/22/2024   GERD (gastroesophageal reflux disease) 07/22/2024   Dehydration 07/22/2024   Leucocytosis 07/22/2024   Lactic acid acidosis 07/22/2024   AKI (acute kidney injury) 07/22/2024   Hyponatremia 07/02/2018   Macular hole 02/18/2012    Orientation RESPIRATION BLADDER Height & Weight     Self, Situation, Place  O2 (20 HHFNC and weaning) Incontinent, External catheter Weight: 157 lb 13.6 oz (71.6 kg) Height:  5' 5 (165.1 cm)  BEHAVIORAL SYMPTOMS/MOOD NEUROLOGICAL BOWEL NUTRITION STATUS      Continent Diet (Please see discharge summary)  AMBULATORY STATUS COMMUNICATION OF NEEDS Skin   Extensive Assist Verbally Normal                       Personal Care Assistance Level of Assistance  Feeding, Bathing, Dressing  Bathing Assistance: Maximum assistance Feeding assistance: Maximum assistance Dressing Assistance: Maximum assistance     Functional Limitations Info  Hearing   Hearing Info: Impaired (R and L)      SPECIAL CARE FACTORS FREQUENCY  PT (By licensed PT), OT (By licensed OT)     PT Frequency: 5x OT Frequency: 5x            Contractures Contractures Info: Not present    Additional Factors Info  Code Status, Allergies, Insulin  Sliding Scale Code Status Info: DNR-LIMITED -Do Not Intubate/DNI Allergies Info: Avapro , Calcium Channel Blockers, Decongestant (pseudoephedrine), Norvasc (amlodipine Besylate), Sulfa Antibiotics, Zofran    Insulin  Sliding Scale Info: Please see discharge summary       Current Medications (08/17/2024):  This is the current hospital active medication list Current Facility-Administered Medications  Medication Dose Route Frequency Provider Last Rate Last Admin   0.9 %  sodium chloride  infusion (Manually program via Guardrails IV Fluids)   Intravenous Once Guillermina Hamilton, MD       albuterol  (PROVENTIL ) (2.5 MG/3ML) 0.083% nebulizer solution 2.5 mg  2.5 mg Nebulization Q4H PRN Olalere, Adewale A, MD   2.5 mg at 08/13/24 1118   atenolol  (TENORMIN ) tablet 25 mg  25 mg Oral QPM Lue Elsie BROCKS, MD   25 mg at 08/16/24 1710   cefTRIAXone  (ROCEPHIN ) 2 g in sodium chloride  0.9 % 100 mL IVPB  2 g Intravenous Q24H Sim Emery CROME, MD   Stopped at 08/17/24 0059   Chlorhexidine  Gluconate Cloth 2 % PADS 6 each  6 each Topical Daily Maree Harder, MD   6 each at 08/16/24 9157   clonazePAM  (KLONOPIN ) tablet 0.5 mg  0.5 mg Oral QHS Lue Elsie BROCKS, MD   0.5 mg at 08/16/24 2230   escitalopram  (LEXAPRO ) tablet 10 mg  10 mg Oral Daily Lue Elsie BROCKS, MD   10 mg at 08/17/24 9077   hydrALAZINE  (APRESOLINE ) injection 10 mg  10 mg Intravenous Q4H PRN Lue Elsie BROCKS, MD   10 mg at 08/17/24 0216   hydrALAZINE  (APRESOLINE ) tablet 50 mg  50 mg Oral Q8H Fairy Frames, MD       insulin  aspart (novoLOG ) injection 0-5 Units  0-5 Units Subcutaneous QHS Garba, Mohammad L, MD   2 Units at 08/13/24 2216   insulin  aspart (novoLOG ) injection 0-9 Units  0-9 Units Subcutaneous TID WC Sim Emery CROME, MD   2 Units at 08/17/24 1133   irbesartan  (AVAPRO ) tablet 300 mg  300 mg Oral Daily Lue Elsie BROCKS, MD   300 mg at 08/17/24 9077   metroNIDAZOLE  (FLAGYL ) IVPB 500 mg  500 mg Intravenous Q12H Sim Emery L, MD 100 mL/hr at 08/17/24 1000 Infusion Verify at 08/17/24 1000   morphine  (PF) 2 MG/ML injection 2 mg  2 mg Intravenous Q2H PRN Garba, Mohammad L, MD   2 mg at 08/17/24 0457   Oral care mouth rinse  15 mL Mouth Rinse PRN Maree Harder, MD       pantoprazole  (PROTONIX ) injection 40 mg  40 mg Intravenous Q12H Sim Emery L, MD   40 mg at 08/17/24 9077   polyethylene glycol (MIRALAX  / GLYCOLAX ) packet 17 g  17 g Oral BID Lue Elsie BROCKS, MD   17 g at 08/17/24 9077   potassium chloride  (KLOR-CON ) packet 40 mEq  40 mEq Oral Q4H Joseph, Preetha, MD   40 mEq at 08/17/24 0831   promethazine  (PHENERGAN ) tablet 12.5 mg  12.5 mg Oral Q6H PRN Garba, Mohammad L, MD       senna-docusate (Senokot-S) tablet 1 tablet  1 tablet Oral BID Lue Elsie BROCKS, MD   1 tablet at 08/17/24 9077     Discharge Medications: Please see discharge summary for a list of discharge medications.  Relevant Imaging Results:  Relevant Lab Results:   Additional Information Pt's SSN: [id number]  Lauraine FORBES Saa, LCSWA     "

## 2024-08-17 NOTE — Progress Notes (Signed)
 " PROGRESS NOTE    Monica Faulkner  FMW:[id number] DOB: 1941/04/02 DOA: 08/12/2024 PCP: Verdia Lombard, MD   Brief Narrative:  Monica Faulkner is a 83 y.o. female with medical history significant of basal cell carcinoma, type 2 diabetes, GERD, essential hypertension, anxiety disorder, depression.  Admitted on 08/12/2024 with diagnosis from rehab with the flu and pneumonia with severe hypoxia and respiratory distress.  Patient initially requiring nonrebreather and heated high flow to maintain sats above 90%.  Patient found to have profound anemia at 4.5 -FOBT positive but dark brown stool noted on exam.  She also has notable subacute PE and age-indeterminate DVT of the left peroneal vein complicating her symptoms. CT also notable for colitis Patient was just discharged from our facility on 12/15 in the setting of profound AKI, lactic acidosis and dehydration with metabolic encephalopathy. Discharged to skilled facility at that time, 11 days later she returned with complaints as outlined above.  Assessment & Plan:  Acute hypoxic respiratory failure Influenza pneumonia Bibasilar pneumonia, rule out bacterial component Subactute PE, age indeterminant DVT, L peroneal vein - Patient's respiratory status is multifactorial in the setting of subacute PE, influenza pneumonia with concern over possible underlying bacterial pneumonia as well  -Has just completed 5 days of Tamiflu  -Continue ceftriaxone  for presumed pneumonia -azithromycin  discontinued, continue X 7 days -On 20 L heated high flow, FiO245% -Repeat chest x-ray today, would benefit from further diuretics however hold off until severe hypokalemia is corrected - Also unable to anticoagulate for PE in the setting of severe anemia and GI bleed  Goals of care  - Dr.Lancaster had lengthy discussion with patient's brother [name] over the weekend who is her default power of attorney as he is her only living relative. - transitioned to DNR.,   Based on further discussions, depending on clinical course over the next several days if she has further decompensations would need additional goals of care discussions, palliative care eval  Acute metabolic encephalopathy, multifactorial, POA  -Multifactorial, likely secondary to above, improving  Severe acute symptomatic anemia Acute GI bleed Acute colitis, ?  Ischemic - Discussed case with GI, given her tenuous status there is no indication for consultation at this time as she would not be appropriate or stable for endoscopy/colonoscopy - Status post 2 unit PRBC - hemoglobin stabilizing around 8 (baseline 11) - Continue metronidazole  and ceftriaxone  for presumed pneumonia as well -No overt bleeding reported today   Essential hypertension:  - Restart home medications as below including atenolol , hydralazine , irbesartan (substituted for home telmisartan)  AKI, resolved: Likely prerenal, downtrending appropriately with supportive care/transfusion  Severe hypokalemia Replete  Subacute pulmonary embolism: Appears to be chronic/subacute per imaging -patient not a candidate for anticoagulation at this time given acute anemia and GI bleed as above  -likely not a candidate for IVC filter given acute illness/hypoxia and instability and PE already established/subacute versus chronic. - Monitor clinical progress   GERD: Continue with PPIs    DVT prophylaxis: SCDs Start: 08/12/24 2331 no chemical prophylaxis in the setting of GI bleed Code Status:   Code Status: Limited: Do not attempt resuscitation (DNR) -DNR-LIMITED -Do Not Intubate/DNI   Family Communication: No family at bedside today, will update brother  Status is: Inpatient  Dispo: The patient is from: [facility] SNF               Anticipated d/c is to: To be determined              Anticipated d/c date is:  To be determined              Patient currently not medically stable for discharge  Consultants:  PCCM GI (discussed there is  currently no indication for intervention and would reconsult them if patient's symptoms improved)  Procedures:  None  Antimicrobials:  Ceftriaxone , metronidazole   Subjective: No events overnight, potassium 2.2 this morning, O2 requirements continue to be very high  Objective: Vitals:   08/17/24 1000 08/17/24 1100 08/17/24 1121 08/17/24 1200  BP: (!) 130/58 138/61  131/71  Pulse: 87 80 79 89  Resp: 19 17 17  (!) 25  Temp: 99.1 F (37.3 C)  98.5 F (36.9 C)   TempSrc:   Oral   SpO2: 99% 98% 99% 100%  Weight:      Height:        Intake/Output Summary (Last 24 hours) at 08/17/2024 1218 Last data filed at 08/17/2024 1100 Gross per 24 hour  Intake 1217.14 ml  Output 3350 ml  Net -2132.86 ml   Filed Weights   08/12/24 1559 08/14/24 0400  Weight: 60.5 kg 71.6 kg    Examination:  General: Frail elderly chronically ill sitting up in bed, AAO x 2, mild cognitive deficits HEENT: Positive JVD CVS: S1-S2, regular rhythm Lungs: Few scattered rhonchi Abdomen: Soft, mildly distended, nontender, bowel sounds present Extremities: 1+ edema  Data Reviewed: I have personally reviewed following labs and imaging studies  CBC: Recent Labs  Lab 08/12/24 1618 08/13/24 0023 08/13/24 1113 08/13/24 1846 08/14/24 0048 08/15/24 0210 08/17/24 0350  WBC 8.8   < > 10.5 8.3 7.3 6.8 6.1  NEUTROABS 7.1  --   --   --   --   --   --   HGB 4.5*   < > 8.1* 8.0* 7.9* 7.8* 8.5*  HCT 14.1*   < > 23.7* 23.9* 23.7* 23.1* 25.0*  MCV 102.2*   < > 91.2 92.3 92.6 92.4 89.3  PLT 371   < > 315 292 277 259 284   < > = values in this interval not displayed.   Basic Metabolic Panel: Recent Labs  Lab 08/12/24 1618 08/13/24 0902 08/14/24 0709 08/15/24 0210 08/17/24 0350  NA 133* 134* 136 138 135  K 4.8 4.3 3.5 3.4* 2.2*  CL 102 104 103 104 96*  CO2 23 22 23 25 31   GLUCOSE 148* 125* 242* 132* 126*  BUN 45* 34* 19 16 11   CREATININE 1.33* 1.18* 0.92 0.80 0.70  CALCIUM 8.9 8.7* 8.3* 8.5* 8.0*  MG  1.9  --  1.7  --   --   PHOS  --   --  2.9  --   --    GFR: Estimated Creatinine Clearance: 53.8 mL/min (by C-G formula based on SCr of 0.7 mg/dL).  Liver Function Tests: Recent Labs  Lab 08/12/24 1618 08/13/24 0902  AST 28 32  ALT 29 29  ALKPHOS 35* 42  BILITOT 0.4 0.3  PROT 5.0* 4.8*  ALBUMIN 3.2* 3.0*   BNP (last 3 results) Recent Labs    08/12/24 1618  PROBNP 5,942.0*   CBG: Recent Labs  Lab 08/16/24 1143 08/16/24 1541 08/16/24 2113 08/17/24 0756 08/17/24 1120  GLUCAP 173* 142* 149* 186* 166*   Thyroid  Function Tests: No results for input(s): TSH, T4TOTAL, FREET4, T3FREE, THYROIDAB in the last 72 hours.  Sepsis Labs: Recent Labs  Lab 08/12/24 1617  LATICACIDVEN 1.1   Recent Results (from the past 240 hours)  Blood culture (routine x 2)  Status: None   Collection Time: 08/12/24  4:09 PM   Specimen: BLOOD  Result Value Ref Range Status   Specimen Description BLOOD SITE NOT SPECIFIED  Final   Special Requests   Final    BOTTLES DRAWN AEROBIC AND ANAEROBIC Blood Culture adequate volume   Culture   Final    NO GROWTH 5 DAYS Performed at [facility], [address] [place], [place] [address]    Report Status 08/17/2024 FINAL  Final  Blood culture (routine x 2)     Status: None   Collection Time: 08/12/24  4:14 PM   Specimen: BLOOD LEFT FOREARM  Result Value Ref Range Status   Specimen Description BLOOD LEFT FOREARM  Final   Special Requests   Final    BOTTLES DRAWN AEROBIC AND ANAEROBIC Blood Culture adequate volume   Culture   Final    NO GROWTH 5 DAYS Performed at [facility], [address], [place], [place] [address]    Report Status 08/17/2024 FINAL  Final  Resp panel by RT-PCR (RSV, Flu A&B, Covid) Anterior Nasal Swab     Status: Abnormal   Collection Time: 08/12/24  4:42 PM   Specimen: Anterior Nasal Swab  Result Value Ref Range Status   SARS Coronavirus 2 by RT PCR NEGATIVE NEGATIVE Final   Influenza A by PCR  POSITIVE (A) NEGATIVE Final   Influenza B by PCR NEGATIVE NEGATIVE Final    Comment: (NOTE) The Xpert Xpress SARS-CoV-2/FLU/RSV plus assay is intended as an aid in the diagnosis of influenza from Nasopharyngeal swab specimens and should not be used as a sole basis for treatment. Nasal washings and aspirates are unacceptable for Xpert Xpress SARS-CoV-2/FLU/RSV testing.  Fact Sheet for Patients: bloggercourse.com  Fact Sheet for Healthcare Providers: seriousbroker.it  This test is not yet approved or cleared by the [place]  FDA and has been authorized for detection and/or diagnosis of SARS-CoV-2 by FDA under an Emergency Use Authorization (EUA). This EUA will remain in effect (meaning this test can be used) for the duration of the COVID-19 declaration under Section 564(b)(1) of the Act, 21 U.S.C. section 360bbb-3(b)(1), unless the authorization is terminated or revoked.     Resp Syncytial Virus by PCR NEGATIVE NEGATIVE Final    Comment: (NOTE) Fact Sheet for Patients: bloggercourse.com  Fact Sheet for Healthcare Providers: seriousbroker.it  This test is not yet approved or cleared by the [place]  FDA and has been authorized for detection and/or diagnosis of SARS-CoV-2 by FDA under an Emergency Use Authorization (EUA). This EUA will remain in effect (meaning this test can be used) for the duration of the COVID-19 declaration under Section 564(b)(1) of the Act, 21 U.S.C. section 360bbb-3(b)(1), unless the authorization is terminated or revoked.  Performed at [facility], [address] [place], [place] [address]   Urine Culture     Status: None   Collection Time: 08/12/24  8:34 PM   Specimen: Urine, Catheterized  Result Value Ref Range Status   Specimen Description URINE, CATHETERIZED  Final   Special Requests NONE  Final   Culture   Final    NO  GROWTH Performed at [facility], [address] [place], [place] [address]    Report Status 08/14/2024 FINAL  Final  MRSA Next Gen by PCR, Nasal     Status: None   Collection Time: 08/12/24 11:21 PM   Specimen: Nasal Mucosa; Nasal Swab  Result Value Ref Range Status  MRSA by PCR Next Gen NOT DETECTED NOT DETECTED Final    Comment: (NOTE) The GeneXpert MRSA Assay (FDA approved for NASAL specimens only), is one component of a comprehensive MRSA colonization surveillance program. It is not intended to diagnose MRSA infection nor to guide or monitor treatment for MRSA infections. Test performance is not FDA approved in patients less than 79 years old. Performed at [facility], [address] [place], [place] [address]     Radiology Studies: No results found.   Scheduled Meds:  sodium chloride    Intravenous Once   atenolol   25 mg Oral QPM   Chlorhexidine  Gluconate Cloth  6 each Topical Daily   clonazePAM   0.5 mg Oral QHS   escitalopram   10 mg Oral Daily   hydrALAZINE   100 mg Oral Q8H   insulin  aspart  0-5 Units Subcutaneous QHS   insulin  aspart  0-9 Units Subcutaneous TID WC   irbesartan   300 mg Oral Daily   pantoprazole  (PROTONIX ) IV  40 mg Intravenous Q12H   polyethylene glycol  17 g Oral BID   potassium chloride   40 mEq Oral Q4H   senna-docusate  1 tablet Oral BID   Continuous Infusions:  cefTRIAXone  (ROCEPHIN )  IV Stopped (08/17/24 0059)   metronidazole  100 mL/hr at 08/17/24 1000     LOS: 5 days   Time spent:   Sigurd Pac, MD [facility]  If 7PM-7AM, please contact night-coverage [email or web address]  08/17/2024, 12:18 PM      "

## 2024-08-17 NOTE — NC FL2 (Signed)
 " [place]  MEDICAID FL2 LEVEL OF CARE FORM     IDENTIFICATION  Patient Name: Monica Faulkner Birthdate: [birth year] Sex: female Admission Date (Current Location): 08/12/2024  [facility] and [place] Number:  Producer, Television/film/video and Address:  The . [facility], [address] [place], [place] [address]      Provider Number: [id number]  Attending Physician Name and Address:  Fairy Frames, MD  Relative Name and Phone Number:  [name] Mattock; Brother; [phone]    Current Level of Care: Hospital Recommended Level of Care: Skilled Nursing Facility Prior Approval Number:    Date Approved/Denied:   PASRR Number: [id number]  Discharge Plan: SNF    Current Diagnoses: Patient Active Problem List   Diagnosis Date Noted   GI bleed 08/12/2024   Symptomatic anemia 08/12/2024   Acute colitis 08/12/2024   Acute pulmonary embolism (HCC) 08/12/2024   Influenza A with pneumonia 08/12/2024   Acute on chronic hypoxic respiratory failure (HCC) 08/12/2024   Acute metabolic encephalopathy 07/22/2024   Controlled type 2 diabetes mellitus without complication, without long-term current use of insulin  (HCC) 07/22/2024   Essential hypertension 07/22/2024   GERD (gastroesophageal reflux disease) 07/22/2024   Dehydration 07/22/2024   Leucocytosis 07/22/2024   Lactic acid acidosis 07/22/2024   AKI (acute kidney injury) 07/22/2024   Hyponatremia 07/02/2018   Macular hole 02/18/2012    Orientation RESPIRATION BLADDER Height & Weight     Self, Situation, Place  O2 (20 HHFNC and weaning) Incontinent, External catheter Weight: 157 lb 13.6 oz (71.6 kg) Height:  5' 5 (165.1 cm)  BEHAVIORAL SYMPTOMS/MOOD NEUROLOGICAL BOWEL NUTRITION STATUS      Continent Diet (Please see discharge summary)  AMBULATORY STATUS COMMUNICATION OF NEEDS Skin   Extensive Assist Verbally Normal                       Personal Care Assistance Level of Assistance  Feeding, Bathing, Dressing  Bathing Assistance: Maximum assistance Feeding assistance: Maximum assistance Dressing Assistance: Maximum assistance     Functional Limitations Info  Hearing   Hearing Info: Impaired (R and L)      SPECIAL CARE FACTORS FREQUENCY  PT (By licensed PT), OT (By licensed OT), Speech therapy     PT Frequency: 5x OT Frequency: 5x     Speech Therapy Frequency: 3x      Contractures Contractures Info: Not present    Additional Factors Info  Code Status, Allergies, Insulin  Sliding Scale Code Status Info: DNR-LIMITED -Do Not Intubate/DNI Allergies Info: Avapro , Calcium Channel Blockers, Decongestant (pseudoephedrine), Norvasc (amlodipine Besylate), Sulfa Antibiotics, Zofran    Insulin  Sliding Scale Info: Please see discharge summary       Current Medications (08/17/2024):  This is the current hospital active medication list Current Facility-Administered Medications  Medication Dose Route Frequency Provider Last Rate Last Admin   0.9 %  sodium chloride  infusion (Manually program via Guardrails IV Fluids)   Intravenous Once Guillermina Hamilton, MD       albuterol  (PROVENTIL ) (2.5 MG/3ML) 0.083% nebulizer solution 2.5 mg  2.5 mg Nebulization Q4H PRN Olalere, Adewale A, MD   2.5 mg at 08/13/24 1118   atenolol  (TENORMIN ) tablet 25 mg  25 mg Oral QPM Lue Elsie BROCKS, MD   25 mg at 08/16/24 1710   cefTRIAXone  (ROCEPHIN ) 2 g in sodium chloride  0.9 % 100 mL IVPB  2 g Intravenous Q24H Sim Emery CROME, MD   Stopped at 08/17/24 0059   Chlorhexidine  Gluconate Cloth 2 %  PADS 6 each  6 each Topical Daily Maree Harder, MD   6 each at 08/16/24 9157   clonazePAM  (KLONOPIN ) tablet 0.5 mg  0.5 mg Oral QHS Lue Elsie BROCKS, MD   0.5 mg at 08/16/24 2230   escitalopram  (LEXAPRO ) tablet 10 mg  10 mg Oral Daily Lue Elsie BROCKS, MD   10 mg at 08/17/24 9077   hydrALAZINE  (APRESOLINE ) injection 10 mg  10 mg Intravenous Q4H PRN Lue Elsie BROCKS, MD   10 mg at 08/17/24 0216   hydrALAZINE   (APRESOLINE ) tablet 50 mg  50 mg Oral Q8H Fairy Frames, MD       insulin  aspart (novoLOG ) injection 0-5 Units  0-5 Units Subcutaneous QHS Garba, Mohammad L, MD   2 Units at 08/13/24 2216   insulin  aspart (novoLOG ) injection 0-9 Units  0-9 Units Subcutaneous TID WC Sim Emery CROME, MD   2 Units at 08/17/24 1133   irbesartan  (AVAPRO ) tablet 300 mg  300 mg Oral Daily Lue Elsie BROCKS, MD   300 mg at 08/17/24 9077   metroNIDAZOLE  (FLAGYL ) IVPB 500 mg  500 mg Intravenous Q12H Sim Emery L, MD 100 mL/hr at 08/17/24 1000 Infusion Verify at 08/17/24 1000   morphine  (PF) 2 MG/ML injection 2 mg  2 mg Intravenous Q2H PRN Garba, Mohammad L, MD   2 mg at 08/17/24 0457   Oral care mouth rinse  15 mL Mouth Rinse PRN Maree Harder, MD       pantoprazole  (PROTONIX ) injection 40 mg  40 mg Intravenous Q12H Sim Emery L, MD   40 mg at 08/17/24 9077   polyethylene glycol (MIRALAX  / GLYCOLAX ) packet 17 g  17 g Oral BID Lue Elsie BROCKS, MD   17 g at 08/17/24 9077   potassium chloride  (KLOR-CON ) packet 40 mEq  40 mEq Oral Q4H Joseph, Preetha, MD   40 mEq at 08/17/24 0831   promethazine  (PHENERGAN ) tablet 12.5 mg  12.5 mg Oral Q6H PRN Garba, Mohammad L, MD       senna-docusate (Senokot-S) tablet 1 tablet  1 tablet Oral BID Lue Elsie BROCKS, MD   1 tablet at 08/17/24 9077     Discharge Medications: Please see discharge summary for a list of discharge medications.  Relevant Imaging Results:  Relevant Lab Results:   Additional Information Pt's SSN: [id number]  Lauraine FORBES Saa, LCSWA     "

## 2024-08-18 DIAGNOSIS — J9621 Acute and chronic respiratory failure with hypoxia: Secondary | ICD-10-CM | POA: Diagnosis not present

## 2024-08-18 LAB — GLUCOSE, CAPILLARY
Glucose-Capillary: 130 mg/dL — ABNORMAL HIGH (ref 70–99)
Glucose-Capillary: 176 mg/dL — ABNORMAL HIGH (ref 70–99)
Glucose-Capillary: 178 mg/dL — ABNORMAL HIGH (ref 70–99)
Glucose-Capillary: 212 mg/dL — ABNORMAL HIGH (ref 70–99)
Glucose-Capillary: 166 mg/dL — ABNORMAL HIGH (ref 70–99)

## 2024-08-18 LAB — CBC
HCT: 24.5 % — ABNORMAL LOW (ref 36.0–46.0)
Hemoglobin: 8.2 g/dL — ABNORMAL LOW (ref 12.0–15.0)
MCH: 30.6 pg (ref 26.0–34.0)
MCHC: 33.5 g/dL (ref 30.0–36.0)
MCV: 91.4 fL (ref 80.0–100.0)
Platelets: 279 K/uL (ref 150–400)
RBC: 2.68 MIL/uL — ABNORMAL LOW (ref 3.87–5.11)
RDW: 15.5 % (ref 11.5–15.5)
WBC: 4.7 K/uL (ref 4.0–10.5)
nRBC: 0 % (ref 0.0–0.2)

## 2024-08-18 LAB — BASIC METABOLIC PANEL WITH GFR
Anion gap: 8 (ref 5–15)
BUN: 11 mg/dL (ref 8–23)
CO2: 28 mmol/L (ref 22–32)
Calcium: 8.2 mg/dL — ABNORMAL LOW (ref 8.9–10.3)
Chloride: 96 mmol/L — ABNORMAL LOW (ref 98–111)
Creatinine, Ser: 0.69 mg/dL (ref 0.44–1.00)
GFR, Estimated: >60 mL/min
Glucose, Bld: 124 mg/dL — ABNORMAL HIGH (ref 70–99)
Potassium: 3.5 mmol/L (ref 3.5–5.1)
Sodium: 132 mmol/L — ABNORMAL LOW (ref 135–145)

## 2024-08-18 LAB — HEPARIN LEVEL (UNFRACTIONATED): Heparin Unfractionated: 0.17 [IU]/mL — ABNORMAL LOW (ref 0.30–0.70)

## 2024-08-18 MED ORDER — SENNOSIDES-DOCUSATE SODIUM 8.6-50 MG PO TABS: 1.0000 | ORAL_TABLET | Freq: Every evening | ORAL | Status: DC | PRN

## 2024-08-18 MED ORDER — FUROSEMIDE 10 MG/ML IJ SOLN
40.0000 mg | Freq: Two times a day (BID) | INTRAMUSCULAR | Status: AC
  Administered 2024-08-18 (×2): 40 mg via INTRAVENOUS
  Filled 2024-08-18 (×2): qty 4

## 2024-08-18 MED ORDER — HEPARIN (PORCINE) 25000 UT/250ML-% IV SOLN
950.0000 [IU]/h | INTRAVENOUS | Status: DC
  Administered 2024-08-18: 800 [IU]/h via INTRAVENOUS
  Administered 2024-08-19 – 2024-08-20 (×2): 950 [IU]/h via INTRAVENOUS
  Filled 2024-08-18 (×3): qty 250

## 2024-08-18 MED ORDER — ACETAMINOPHEN 325 MG PO TABS
650.0000 mg | ORAL_TABLET | Freq: Four times a day (QID) | ORAL | Status: DC | PRN
  Administered 2024-08-18 – 2024-08-25 (×3): 650 mg via ORAL
  Filled 2024-08-18 (×3): qty 2

## 2024-08-18 MED ORDER — POTASSIUM CHLORIDE 20 MEQ PO PACK: 40.0000 meq | PACK | Freq: Two times a day (BID) | ORAL | Status: DC

## 2024-08-18 MED ORDER — POTASSIUM CHLORIDE CRYS ER 20 MEQ PO TBCR
40.0000 meq | EXTENDED_RELEASE_TABLET | Freq: Two times a day (BID) | ORAL | Status: AC
  Administered 2024-08-18 (×2): 40 meq via ORAL
  Filled 2024-08-18 (×2): qty 2

## 2024-08-18 NOTE — Progress Notes (Signed)
 PHARMACY - ANTICOAGULATION CONSULT NOTE  Pharmacy Consult for IV Heparin  Indication: pulmonary embolus  Allergies[1]  Patient Measurements: Height: 5' 5 (165.1 cm) Weight: 67.6 kg (149 lb 0.5 oz) IBW/kg (Calculated) : 57 HEPARIN  DW (KG): 60.5  Vital Signs: Temp: 97.4 F (36.3 C) (01/01 1926) Temp Source: Oral (01/01 1926) BP: 131/64 (01/01 2200) Pulse Rate: 68 (01/01 2200)  Labs: Recent Labs    08/17/24 0350 08/17/24 1136 08/18/24 0307 08/18/24 2220  HGB 8.5*  --  8.2*  --   HCT 25.0*  --  24.5*  --   PLT 284  --  279  --   HEPARINUNFRC  --   --   --  0.17*  CREATININE 0.70 0.73 0.69  --     Estimated Creatinine Clearance: 48.8 mL/min (by C-G formula based on SCr of 0.69 mg/dL).   Medical History: Past Medical History:  Diagnosis Date   Anxiety    seen at [facility], for extreme anxiety. As a result of her anxiety  she had ECHO   Cancer (HCC)    basal cell    Complication of anesthesia    atropine  increased BP & increased pulse   Depression    Diabetes mellitus    type 2- 2006, managed with diet   GERD (gastroesophageal reflux disease)    rare occurence   H/O hiatal hernia    Headache(784.0)    caused by sinus congestion fr. time to time    Hypertension     Medications:  Scheduled:   sodium chloride    Intravenous Once   atenolol   25 mg Oral QPM   Chlorhexidine  Gluconate Cloth  6 each Topical Daily   clonazePAM   0.5 mg Oral QHS   escitalopram   10 mg Oral Daily   hydrALAZINE   50 mg Oral Q8H   insulin  aspart  0-5 Units Subcutaneous QHS   insulin  aspart  0-9 Units Subcutaneous TID WC   irbesartan   300 mg Oral Daily   pantoprazole  (PROTONIX ) IV  40 mg Intravenous Q12H   polyethylene glycol  17 g Oral BID   Infusions:   heparin  800 Units/hr (08/18/24 1900)    Assessment: 84 years of age female admitted with Flu and pneumonia, found to have PE and colitis with GIB. Anticoagulation was held with Hgb down to 4.5 on admission and patient was transfused 2  units PRBCs. Hemoglobin has since stabilized around 8  (baseline 11) and no further bleeding reported. MD discussed with GI - no indication for intervention at this time. Pharmacy consulted to trial IV Heparin  for PE treatment - no bolus with recent bleeding. Plans are to titrate up slowly given recent bleed.   -heparin  level= 0.17 on 800 units/hr  Goal of Therapy:  Heparin  level 0.3-0.7 units/ml Monitor platelets by anticoagulation protocol: Yes   Plan:  -No heparin  bolus -Increase heparin  to 950 units/hr -Heparin  level in 8 hrs  Prentice Poisson, PharmD Clinical Pharmacist **Pharmacist phone directory can now be found on [email or web address] ([email or web address]).  Listed under [facility].         [1]  Allergies Allergen Reactions   Avapro  [Irbesartan ] Other (See Comments)    Difficulty breathing, feeling like she was going to pass out   Calcium Channel Blockers     ineffective   Decongestant [Pseudoephedrine] Other (See Comments)    Heart races , blood pressure goes up   Norvasc [Amlodipine Besylate] Itching, Other (See Comments) and Hypertension    Turned skin red   Sulfa Antibiotics Other (  See Comments)    Made kidney infection worse   Zofran  [Ondansetron ] Diarrhea    Only ODT tablets cause this problem.

## 2024-08-18 NOTE — Progress Notes (Addendum)
 " PROGRESS NOTE    Monica Faulkner  FMW:[id number] DOB: 01-04-1941 DOA: 08/12/2024 PCP: Verdia Lombard, MD   Brief Narrative:  Analyah Montina Faulkner is a 84 y.o. female with medical history significant of basal cell carcinoma, type 2 diabetes, GERD, essential hypertension, anxiety disorder, depression.  Admitted on 08/12/2024 with diagnosis from rehab with the flu and pneumonia with severe hypoxia and respiratory distress.  Patient initially requiring nonrebreather and heated high flow to maintain sats above 90%.  Patient found to have profound anemia at 4.5 -FOBT positive but dark brown stool noted on exam.  She also has notable subacute PE and age-indeterminate DVT of the left peroneal vein complicating her symptoms. CT also notable for colitis Patient was just discharged from our facility on 12/15 in the setting of profound AKI, lactic acidosis and dehydration with metabolic encephalopathy. Discharged to skilled facility at that time, 11 days later she returned with complaints as outlined above.  Assessment & Plan:  Acute hypoxic respiratory failure Influenza pneumonia Bibasilar pneumonia, rule out bacterial component Subactute PE, age indeterminant DVT, L peroneal vein - Patient's respiratory status is multifactorial in the setting of subacute PE, influenza pneumonia with concern over possible underlying bacterial pneumonia as well  -Has just completed 5 days of Tamiflu  - Will complete 7-day course of ceftriaxone  today, off zirthomax -On 20 L heated high flow, FiO2-45% - Repeat IV Lasix  today - had been unable to anticoagulate for PE in the setting of severe anemia and GI bleed, appears to be no further evidence of active GI bleed at this time, will trial IV heparin  and monitor response  Goals of care  - Dr.Lancaster had lengthy discussion with patient's brother [name] over the weekend who is her default power of attorney as he is her only living relative. - transitioned to DNR.,   Based on further discussions, depending on clinical course over the next several days if she has further decompensations would need additional goals of care discussions, palliative care eval  Acute metabolic encephalopathy, multifactorial, POA  -Multifactorial, likely secondary to above, improving  Severe acute symptomatic anemia Acute GI bleed Acute colitis, ?  Ischemic - Dr.Lancaster Discussed case with GI, given her tenuous status there is no indication for consultation at this time as she would not be appropriate or stable for endoscopy/colonoscopy - Status post 2 unit PRBC - hemoglobin stabilizing around 8 (baseline 11) - RX w metronidazole  and ceftriaxone  for presumed pneumonia as well, will complete 7 days of ABX -No overt bleeding reported today   Essential hypertension:  - Restart home medications as below including atenolol , hydralazine , irbesartan (substituted for home telmisartan)  AKI, resolved: Likely prerenal, downtrending appropriately with supportive care/transfusion  Severe hypokalemia Replete  Subacute pulmonary embolism: Appears to be chronic/subacute per imaging -patient not a candidate for anticoagulation at this time given acute anemia and GI bleed as above  -likely not a candidate for IVC filter given acute illness/hypoxia and instability and PE already established/subacute versus chronic. - Monitor clinical progress trial of anticoagulation as discussed above   GERD: Continue with PPIs    DVT prophylaxis: SCDs Start: 08/12/24 2331 no chemical prophylaxis in the setting of GI bleed Code Status:   Code Status: Limited: Do not attempt resuscitation (DNR) -DNR-LIMITED -Do Not Intubate/DNI   Family Communication: No family at bedside today, called and updated brother [name]  Status is: Inpatient  Dispo: The patient is from: [facility]  Anticipated d/c is to: To be determined              Anticipated d/c date is: To be determined               Patient currently not medically stable for discharge  Consultants:  PCCM GI (discussed there is currently no indication for intervention and would reconsult them if patient's symptoms improved)  Procedures:  None  Antimicrobials:  Ceftriaxone , metronidazole   Subjective: No events overnight, remains on 20 L heated high flow, per RN, no bleeding issues last night  Objective: Vitals:   08/18/24 0700 08/18/24 0737 08/18/24 0756 08/18/24 0800  BP: (!) 170/79   (!) 150/60  Pulse: 76  87 84  Resp: (!) 22  16 (!) 23  Temp:  (!) 97.5 F (36.4 C)    TempSrc:  Axillary    SpO2: 91%  (!) 89% (!) 89%  Weight:      Height:        Intake/Output Summary (Last 24 hours) at 08/18/2024 1024 Last data filed at 08/18/2024 0600 Gross per 24 hour  Intake 745.57 ml  Output 1750 ml  Net -1004.43 ml   Filed Weights   08/12/24 1559 08/14/24 0400 08/17/24 2000  Weight: 60.5 kg 71.6 kg 67.6 kg    Examination:  General: Frail elderly chronically ill sitting up in bed, AAO x 2, mild cognitive deficits, no distress HEENT: No JVD CVS: S1-S2, regular rhythm Lungs: Few scattered rhonchi Abdomen: Soft, mildly distended, nontender, bowel sounds present Extremities: 1+ edema  Data Reviewed: I have personally reviewed following labs and imaging studies  CBC: Recent Labs  Lab 08/12/24 1618 08/13/24 0023 08/13/24 1846 08/14/24 0048 08/15/24 0210 08/17/24 0350 08/18/24 0307  WBC 8.8   < > 8.3 7.3 6.8 6.1 4.7  NEUTROABS 7.1  --   --   --   --   --   --   HGB 4.5*   < > 8.0* 7.9* 7.8* 8.5* 8.2*  HCT 14.1*   < > 23.9* 23.7* 23.1* 25.0* 24.5*  MCV 102.2*   < > 92.3 92.6 92.4 89.3 91.4  PLT 371   < > 292 277 259 284 279   < > = values in this interval not displayed.   Basic Metabolic Panel: Recent Labs  Lab 08/12/24 1618 08/13/24 0902 08/14/24 0709 08/15/24 0210 08/17/24 0350 08/17/24 1136 08/18/24 0307  NA 133*   < > 136 138 135 130* 132*  K 4.8   < > 3.5 3.4* 2.2* 3.7 3.5  CL 102   <  > 103 104 96* 95* 96*  CO2 23   < > 23 25 31 27 28   GLUCOSE 148*   < > 242* 132* 126* 165* 124*  BUN 45*   < > 19 16 11 12 11   CREATININE 1.33*   < > 0.92 0.80 0.70 0.73 0.69  CALCIUM 8.9   < > 8.3* 8.5* 8.0* 7.8* 8.2*  MG 1.9  --  1.7  --   --  1.5*  --   PHOS  --   --  2.9  --   --   --   --    < > = values in this interval not displayed.   GFR: Estimated Creatinine Clearance: 48.8 mL/min (by C-G formula based on SCr of 0.69 mg/dL).  Liver Function Tests: Recent Labs  Lab 08/12/24 1618 08/13/24 0902  AST 28 32  ALT 29 29  ALKPHOS 35* 42  BILITOT 0.4 0.3  PROT 5.0* 4.8*  ALBUMIN 3.2* 3.0*   BNP (last 3 results) Recent Labs    08/12/24 1618  PROBNP 5,942.0*   CBG: Recent Labs  Lab 08/17/24 0756 08/17/24 1120 08/17/24 1515 08/17/24 2105 08/18/24 0736  GLUCAP 186* 166* 156* 135* 130*   Thyroid  Function Tests: No results for input(s): TSH, T4TOTAL, FREET4, T3FREE, THYROIDAB in the last 72 hours.  Sepsis Labs: Recent Labs  Lab 08/12/24 1617  LATICACIDVEN 1.1   Recent Results (from the past 240 hours)  Blood culture (routine x 2)     Status: None   Collection Time: 08/12/24  4:09 PM   Specimen: BLOOD  Result Value Ref Range Status   Specimen Description BLOOD SITE NOT SPECIFIED  Final   Special Requests   Final    BOTTLES DRAWN AEROBIC AND ANAEROBIC Blood Culture adequate volume   Culture   Final    NO GROWTH 5 DAYS Performed at [facility], [address] [place], [place] [address]    Report Status 08/17/2024 FINAL  Final  Blood culture (routine x 2)     Status: None   Collection Time: 08/12/24  4:14 PM   Specimen: BLOOD LEFT FOREARM  Result Value Ref Range Status   Specimen Description BLOOD LEFT FOREARM  Final   Special Requests   Final    BOTTLES DRAWN AEROBIC AND ANAEROBIC Blood Culture adequate volume   Culture   Final    NO GROWTH 5 DAYS Performed at [facility], [address] [place], [place] [address]    Report  Status 08/17/2024 FINAL  Final  Resp panel by RT-PCR (RSV, Flu A&B, Covid) Anterior Nasal Swab     Status: Abnormal   Collection Time: 08/12/24  4:42 PM   Specimen: Anterior Nasal Swab  Result Value Ref Range Status   SARS Coronavirus 2 by RT PCR NEGATIVE NEGATIVE Final   Influenza A by PCR POSITIVE (A) NEGATIVE Final   Influenza B by PCR NEGATIVE NEGATIVE Final    Comment: (NOTE) The Xpert Xpress SARS-CoV-2/FLU/RSV plus assay is intended as an aid in the diagnosis of influenza from Nasopharyngeal swab specimens and should not be used as a sole basis for treatment. Nasal washings and aspirates are unacceptable for Xpert Xpress SARS-CoV-2/FLU/RSV testing.  Fact Sheet for Patients: bloggercourse.com  Fact Sheet for Healthcare Providers: seriousbroker.it  This test is not yet approved or cleared by the [place]  FDA and has been authorized for detection and/or diagnosis of SARS-CoV-2 by FDA under an Emergency Use Authorization (EUA). This EUA will remain in effect (meaning this test can be used) for the duration of the COVID-19 declaration under Section 564(b)(1) of the Act, 21 U.S.C. section 360bbb-3(b)(1), unless the authorization is terminated or revoked.     Resp Syncytial Virus by PCR NEGATIVE NEGATIVE Final    Comment: (NOTE) Fact Sheet for Patients: bloggercourse.com  Fact Sheet for Healthcare Providers: seriousbroker.it  This test is not yet approved or cleared by the [place]  FDA and has been authorized for detection and/or diagnosis of SARS-CoV-2 by FDA under an Emergency Use Authorization (EUA). This EUA will remain in effect (meaning this test can be used) for the duration of the COVID-19 declaration under Section 564(b)(1) of the Act, 21 U.S.C. section 360bbb-3(b)(1), unless the authorization is terminated or revoked.  Performed at [facility],  [address] [place], [place] [address]   Urine Culture     Status: None  Collection Time: 08/12/24  8:34 PM   Specimen: Urine, Catheterized  Result Value Ref Range Status   Specimen Description URINE, CATHETERIZED  Final   Special Requests NONE  Final   Culture   Final    NO GROWTH Performed at [facility], [address] [place], [place] [address]    Report Status 08/14/2024 FINAL  Final  MRSA Next Gen by PCR, Nasal     Status: None   Collection Time: 08/12/24 11:21 PM   Specimen: Nasal Mucosa; Nasal Swab  Result Value Ref Range Status   MRSA by PCR Next Gen NOT DETECTED NOT DETECTED Final    Comment: (NOTE) The GeneXpert MRSA Assay (FDA approved for NASAL specimens only), is one component of a comprehensive MRSA colonization surveillance program. It is not intended to diagnose MRSA infection nor to guide or monitor treatment for MRSA infections. Test performance is not FDA approved in patients less than 80 years old. Performed at [facility], [address], [place], [place] [address]     Radiology Studies: DG CHEST PORT 1 VIEW Result Date: 08/18/2024 EXAM: 1 VIEW(S) XRAY OF THE CHEST 08/17/2024 12:37:21 PM COMPARISON: 08/12/2024 CLINICAL HISTORY: 200808 Hypoxia 200808 FINDINGS: LUNGS AND PLEURA: Low lung volumes. Bibasilar opacities. Increased bilateral pleural effusions. No pneumothorax. HEART AND MEDIASTINUM: Aortic atherosclerosis. No acute abnormality of the cardiac and mediastinal silhouettes. BONES AND SOFT TISSUES: Thoracic dextroscoliosis. IMPRESSION: 1. Low lung volumes with bibasilar opacities and increased bilateral pleural effusions. Electronically signed by: Greig Pique MD 08/18/2024 01:23 AM EST RP Workstation: [id number]     Scheduled Meds:  sodium chloride    Intravenous Once   atenolol   25 mg Oral QPM   Chlorhexidine  Gluconate Cloth  6 each Topical Daily   clonazePAM   0.5 mg Oral QHS   escitalopram   10 mg Oral Daily   furosemide   40 mg  Intravenous BID   hydrALAZINE   50 mg Oral Q8H   insulin  aspart  0-5 Units Subcutaneous QHS   insulin  aspart  0-9 Units Subcutaneous TID WC   irbesartan   300 mg Oral Daily   pantoprazole  (PROTONIX ) IV  40 mg Intravenous Q12H   polyethylene glycol  17 g Oral BID   potassium chloride   40 mEq Oral BID   Continuous Infusions:  metronidazole  500 mg (08/18/24 0914)     LOS: 6 days   Time spent:   Sigurd Pac, MD [facility]  If 7PM-7AM, please contact night-coverage [email or web address]  08/18/2024, 10:24 AM      "

## 2024-08-18 NOTE — Progress Notes (Signed)
 PHARMACY - ANTICOAGULATION CONSULT NOTE  Pharmacy Consult for IV Heparin  Indication: pulmonary embolus  Allergies[1]  Patient Measurements: Height: 5' 5 (165.1 cm) Weight: 67.6 kg (149 lb 0.5 oz) IBW/kg (Calculated) : 57 HEPARIN  DW (KG): 60.5  Vital Signs: Temp: 97.5 F (36.4 C) (01/01 0737) Temp Source: Axillary (01/01 0737) BP: 150/60 (01/01 0800) Pulse Rate: 84 (01/01 0800)  Labs: Recent Labs    08/17/24 0350 08/17/24 1136 08/18/24 0307  HGB 8.5*  --  8.2*  HCT 25.0*  --  24.5*  PLT 284  --  279  CREATININE 0.70 0.73 0.69    Estimated Creatinine Clearance: 48.8 mL/min (by C-G formula based on SCr of 0.69 mg/dL).   Medical History: Past Medical History:  Diagnosis Date   Anxiety    seen at [facility], for extreme anxiety. As a result of her anxiety  she had ECHO   Cancer (HCC)    basal cell    Complication of anesthesia    atropine  increased BP & increased pulse   Depression    Diabetes mellitus    type 2- 2006, managed with diet   GERD (gastroesophageal reflux disease)    rare occurence   H/O hiatal hernia    Headache(784.0)    caused by sinus congestion fr. time to time    Hypertension     Medications:  Scheduled:   sodium chloride    Intravenous Once   atenolol   25 mg Oral QPM   Chlorhexidine  Gluconate Cloth  6 each Topical Daily   clonazePAM   0.5 mg Oral QHS   escitalopram   10 mg Oral Daily   furosemide   40 mg Intravenous BID   hydrALAZINE   50 mg Oral Q8H   insulin  aspart  0-5 Units Subcutaneous QHS   insulin  aspart  0-9 Units Subcutaneous TID WC   irbesartan   300 mg Oral Daily   pantoprazole  (PROTONIX ) IV  40 mg Intravenous Q12H   polyethylene glycol  17 g Oral BID   potassium chloride   40 mEq Oral BID   Infusions:   metronidazole  500 mg (08/18/24 0914)    Assessment: 84 years of age female admitted with Flu and pneumonia, found to have PE and colitis with GIB. Anticoagulation was held with Hgb down to 4.5 on admission and patient was  transfused 2 units PRBCs. Hemoglobin has since stabilized around 8  (baseline 11) and no further bleeding reported. MD discussed with GI - no indication for intervention at this time. Pharmacy consulted to trial IV Heparin  for PE treatment - no bolus with recent bleeding. Will start low and titrate up slowly given recent bleed.   Goal of Therapy:  Heparin  level 0.3-0.7 units/ml Monitor platelets by anticoagulation protocol: Yes   Plan:  Start heparin  infusion at 800 units/hr Check anti-Xa level in 8 hours and daily while on heparin  Continue to monitor H&H and platelets  Harlene Boga, PharmD Please refer to AMION for [facility] Pharmacy numbers 08/18/2024,10:31 AM      [1]  Allergies Allergen Reactions   Avapro  [Irbesartan ] Other (See Comments)    Difficulty breathing, feeling like she was going to pass out   Calcium Channel Blockers     ineffective   Decongestant [Pseudoephedrine] Other (See Comments)    Heart races , blood pressure goes up   Norvasc [Amlodipine Besylate] Itching, Other (See Comments) and Hypertension    Turned skin red   Sulfa Antibiotics Other (See Comments)    Made kidney infection worse   Zofran  [Ondansetron ] Diarrhea  Only ODT tablets cause this problem.

## 2024-08-19 ENCOUNTER — Inpatient Hospital Stay (HOSPITAL_COMMUNITY)

## 2024-08-19 ENCOUNTER — Encounter (HOSPITAL_COMMUNITY): Payer: Self-pay | Admitting: Internal Medicine

## 2024-08-19 DIAGNOSIS — I5031 Acute diastolic (congestive) heart failure: Secondary | ICD-10-CM | POA: Diagnosis not present

## 2024-08-19 DIAGNOSIS — J9621 Acute and chronic respiratory failure with hypoxia: Secondary | ICD-10-CM | POA: Diagnosis not present

## 2024-08-19 LAB — CBC
HCT: 25.8 % — ABNORMAL LOW (ref 36.0–46.0)
Hemoglobin: 8.4 g/dL — ABNORMAL LOW (ref 12.0–15.0)
MCH: 29.9 pg (ref 26.0–34.0)
MCHC: 32.6 g/dL (ref 30.0–36.0)
MCV: 91.8 fL (ref 80.0–100.0)
Platelets: 314 K/uL (ref 150–400)
RBC: 2.81 MIL/uL — ABNORMAL LOW (ref 3.87–5.11)
RDW: 15.6 % — ABNORMAL HIGH (ref 11.5–15.5)
WBC: 4.2 K/uL (ref 4.0–10.5)
nRBC: 0 % (ref 0.0–0.2)

## 2024-08-19 LAB — BASIC METABOLIC PANEL WITH GFR
Anion gap: 6 (ref 5–15)
BUN: 9 mg/dL (ref 8–23)
CO2: 31 mmol/L (ref 22–32)
Calcium: 8.1 mg/dL — ABNORMAL LOW (ref 8.9–10.3)
Chloride: 98 mmol/L (ref 98–111)
Creatinine, Ser: 0.7 mg/dL (ref 0.44–1.00)
GFR, Estimated: >60 mL/min
Glucose, Bld: 103 mg/dL — ABNORMAL HIGH (ref 70–99)
Potassium: 4 mmol/L (ref 3.5–5.1)
Sodium: 135 mmol/L (ref 135–145)

## 2024-08-19 LAB — ECHOCARDIOGRAM COMPLETE
Area-P 1/2: 3.42 cm2
Calc EF: 61.1 %
Height: 65 in
S' Lateral: 2.4 cm
Single Plane A2C EF: 62 %
Single Plane A4C EF: 60.3 %
Weight: 2384.5 [oz_av]

## 2024-08-19 LAB — GLUCOSE, CAPILLARY
Glucose-Capillary: 101 mg/dL — ABNORMAL HIGH (ref 70–99)
Glucose-Capillary: 119 mg/dL — ABNORMAL HIGH (ref 70–99)
Glucose-Capillary: 205 mg/dL — ABNORMAL HIGH (ref 70–99)
Glucose-Capillary: 167 mg/dL — ABNORMAL HIGH (ref 70–99)
Glucose-Capillary: 144 mg/dL — ABNORMAL HIGH (ref 70–99)

## 2024-08-19 LAB — HEPARIN LEVEL (UNFRACTIONATED): Heparin Unfractionated: 0.6 [IU]/mL (ref 0.30–0.70)

## 2024-08-19 MED ORDER — POTASSIUM CHLORIDE CRYS ER 20 MEQ PO TBCR: 40.0000 meq | EXTENDED_RELEASE_TABLET | Freq: Two times a day (BID) | ORAL | Status: DC

## 2024-08-19 MED ORDER — FUROSEMIDE 10 MG/ML IJ SOLN
40.0000 mg | Freq: Two times a day (BID) | INTRAMUSCULAR | Status: AC
  Administered 2024-08-19 (×2): 40 mg via INTRAVENOUS
  Filled 2024-08-19 (×2): qty 4

## 2024-08-19 MED ORDER — POTASSIUM CHLORIDE 20 MEQ PO PACK
40.0000 meq | PACK | Freq: Once | ORAL | Status: AC
  Administered 2024-08-19: 40 meq via ORAL
  Filled 2024-08-19: qty 2

## 2024-08-19 MED ORDER — POTASSIUM CHLORIDE CRYS ER 20 MEQ PO TBCR: 40.0000 meq | EXTENDED_RELEASE_TABLET | Freq: Once | ORAL | Status: DC

## 2024-08-19 MED ORDER — PANTOPRAZOLE SODIUM 40 MG PO TBEC
40.0000 mg | DELAYED_RELEASE_TABLET | Freq: Every day | ORAL | Status: DC
  Administered 2024-08-19 – 2024-08-25 (×7): 40 mg via ORAL
  Filled 2024-08-19 (×7): qty 1

## 2024-08-19 NOTE — TOC Progression Note (Signed)
 Transition of Care [facility]) - Progression Note    Patient Details  Name: Monica Faulkner MRN: [id number] Date of Birth: 1941/01/18  [facility] CM/SW Contact  Lauraine FORBES Saa, LCSWA Phone Number: 08/19/2024, 12:30 PM  Clinical Narrative:     12:30 PM Per progressions, patient remains on HFNC and has transfer orders for tele unit. CSW to submit SNF insurance authorization once patient has weaned off HFNC. CSW will continue to follow.  Expected Discharge Plan: Skilled Nursing Facility Barriers to Discharge: Continued Medical Work up, English As A Second Language Teacher               Expected Discharge Plan and Services In-house Referral: Clinical Social Work   Post Acute Care Choice: Skilled Nursing Facility Living arrangements for the past 2 months: Single Family Home, Skilled Nursing Facility                                       Social Drivers of Health (SDOH) Interventions SDOH Screenings   Food Insecurity: Patient Unable To Answer (07/23/2024)  Housing: Patient Unable To Answer (07/23/2024)  Transportation Needs: Patient Unable To Answer (07/23/2024)  Utilities: Patient Unable To Answer (07/23/2024)  Social Connections: Patient Unable To Answer (07/23/2024)  Tobacco Use: Low Risk (07/22/2024)    Readmission Risk Interventions     No data to display

## 2024-08-19 NOTE — Progress Notes (Signed)
 SLP Cancellation Note  Patient Details Name: Masen Salvas MRN: [id number] DOB: May 30, 1941   Cancelled treatment:       Reason Eval/Treat Not Completed: Patient at procedure or test/unavailable   Pat Ostin Mathey,M.S.,CCC-SLP 08/19/2024, 3:28 PM

## 2024-08-19 NOTE — Plan of Care (Signed)

## 2024-08-19 NOTE — Progress Notes (Signed)
 PHARMACY - ANTICOAGULATION CONSULT NOTE  Pharmacy Consult for IV Heparin  Indication: pulmonary embolus  Allergies[1]  Patient Measurements: Height: 5' 5 (165.1 cm) Weight: 67.6 kg (149 lb 0.5 oz) IBW/kg (Calculated) : 57 HEPARIN  DW (KG): 60.5  Vital Signs: Temp: 97.8 F (36.6 C) (01/02 1446) Temp Source: Oral (01/02 1446) BP: 142/63 (01/02 1446) Pulse Rate: 81 (01/02 1446)  Labs: Recent Labs    08/17/24 0350 08/17/24 1136 08/18/24 0307 08/18/24 2220 08/19/24 0254 08/19/24 0902  HGB 8.5*  --  8.2*  --  8.4*  --   HCT 25.0*  --  24.5*  --  25.8*  --   PLT 284  --  279  --  314  --   HEPARINUNFRC  --   --   --  0.17*  --  0.60  CREATININE 0.70 0.73 0.69  --  0.70  --     Estimated Creatinine Clearance: 48.8 mL/min (by C-G formula based on SCr of 0.7 mg/dL).   Medical History: Past Medical History:  Diagnosis Date   Anxiety    seen at [facility], for extreme anxiety. As a result of her anxiety  she had ECHO   Cancer (HCC)    basal cell    Complication of anesthesia    atropine  increased BP & increased pulse   Depression    Diabetes mellitus    type 2- 2006, managed with diet   GERD (gastroesophageal reflux disease)    rare occurence   H/O hiatal hernia    Headache(784.0)    caused by sinus congestion fr. time to time    Hypertension     Medications:  Scheduled:   sodium chloride    Intravenous Once   atenolol   25 mg Oral QPM   Chlorhexidine  Gluconate Cloth  6 each Topical Daily   clonazePAM   0.5 mg Oral QHS   escitalopram   10 mg Oral Daily   furosemide   40 mg Intravenous BID   hydrALAZINE   50 mg Oral Q8H   insulin  aspart  0-5 Units Subcutaneous QHS   insulin  aspart  0-9 Units Subcutaneous TID WC   irbesartan   300 mg Oral Daily   pantoprazole   40 mg Oral Daily   polyethylene glycol  17 g Oral BID   Infusions:   heparin  950 Units/hr (08/19/24 1400)    Assessment: 84 years of age female admitted with Flu and pneumonia, found to have PE and colitis with  GIB. Anticoagulation was held with Hgb down to 4.5 on admission and patient was transfused 2 units PRBCs. Hemoglobin has since stabilized around 8  (baseline 11) and no further bleeding reported. MD discussed with GI - no indication for intervention at this time. Pharmacy consulted to trial IV Heparin  for PE treatment - no bolus with recent bleeding. Plans are to titrate up slowly given recent bleed.   08/19/24: Heparin  level 0.60 this morning, therapeutic at heparin  950 units/hr. No issues with infusion running or signs of bleeding noted. CBC stable (Hgb 8.4, PLT 314).   Goal of Therapy:  Heparin  level 0.3-0.7 units/ml Monitor platelets by anticoagulation protocol: Yes   Plan:  Continue heparin  950 units/hr Check heparin  level with AM labs Monitor CBC and s/sx of bleeding daily  Morna Breach, PharmD, BCPS PGY2 Cardiology Pharmacy Resident 08/19/2024 3:10 PM    [1]  Allergies Allergen Reactions   Avapro  [Irbesartan ] Other (See Comments)    Difficulty breathing, feeling like she was going to pass out   Calcium Channel Blockers  ineffective   Decongestant [Pseudoephedrine] Other (See Comments)    Heart races , blood pressure goes up   Norvasc [Amlodipine Besylate] Itching, Other (See Comments) and Hypertension    Turned skin red   Sulfa Antibiotics Other (See Comments)    Made kidney infection worse   Zofran  [Ondansetron ] Diarrhea    Only ODT tablets cause this problem.

## 2024-08-19 NOTE — Progress Notes (Signed)
" °  Echocardiogram 2D Echocardiogram has been performed.  Koleen KANDICE Popper, RDCS 08/19/2024, 1:33 PM "

## 2024-08-19 NOTE — Progress Notes (Signed)
 " PROGRESS NOTE    Monica Faulkner  FMW:[id number] DOB: 10/06/1940 DOA: 08/12/2024 PCP: Verdia Lombard, MD   Brief Narrative:  Monica Faulkner is a 84 y.o. female with medical history significant of basal cell carcinoma, type 2 diabetes, GERD, essential hypertension, anxiety disorder, depression.  Admitted on 08/12/2024 with diagnosis from rehab with the flu and pneumonia with severe hypoxia and respiratory distress.  Patient initially requiring nonrebreather and heated high flow to maintain sats above 90%.  Patient found to have profound anemia at 4.5 -FOBT positive but dark brown stool noted on exam.  She also has notable subacute PE and age-indeterminate DVT of the left peroneal vein complicating her symptoms. CT also notable for colitis - Admitted, started on Tamiflu  and antibiotics, CT abdomen concerning for colitis - Respiratory status slowly improving - No further bleeding, 1/1 started on IV heparin   Assessment & Plan:  Acute hypoxic respiratory failure Influenza pneumonia Bibasilar pneumonia, rule out bacterial component Subactute PE, age indeterminant DVT, L peroneal vein - Patient's respiratory status is multifactorial in the setting of subacute PE, influenza pneumonia with concern over superimposed bacterial pneumonia as well  - Completed 5 days of Tamiflu , completed 7 days of ABX - Was initially on on 20 L heated high flow, FiO2-45% - Iatrogenic fluid overload also contributing, continue IV Lasix  today - had been unable to anticoagulate for PE in the setting of severe anemia and GI bleed, appears to be no further evidence of active GI bleed at this time, started on IV heparin  yesterday, will continue for 48 to 72 hours and if stable switch to DOAC  Goals of care  - Dr.Lancaster had lengthy discussion with patient's brother [name] over the weekend who is her default power of attorney as he is her only living relative. - transitioned to DNR.,  Plan for palliative care eval  if she deteriorates further, fortunately has been slowly stabilizing/improving for now  Acute metabolic encephalopathy, multifactorial, POA  -Multifactorial, likely secondary to above, improving  Severe acute symptomatic anemia Acute GI bleed Acute colitis, ?  Ischemic - Dr.Lancaster Discussed case with GI, given her tenuous status there is no indication for consultation at this time as she would not be appropriate or stable for endoscopy/colonoscopy - Status post 2 unit PRBC - hemoglobin stabilizing around 8 (baseline 11) - RX w metronidazole  and ceftriaxone  for presumed pneumonia as well, completed 7 days of ABX -No overt bleeding reported anymore   Essential hypertension:  - Restart home medications as below including atenolol , hydralazine , irbesartan (substituted for home telmisartan)  AKI, resolved: Likely prerenal, downtrending appropriately with supportive care/transfusion  Severe hypokalemia Replete  Subacute pulmonary embolism:  - See discussion above  GERD: Continue with PPIs    DVT prophylaxis: SCDs Start: 08/12/24 2331 no chemical prophylaxis in the setting of GI bleed Code Status:   Code Status: Limited: Do not attempt resuscitation (DNR) -DNR-LIMITED -Do Not Intubate/DNI   Family Communication: No family at bedside today, called and updated brother [name] yesterday  Status is: Inpatient  Dispo: The patient is from: [facility]               Anticipated d/c is to: SNF              Anticipated d/c date is: To be determined              Patient currently not medically stable for discharge  Consultants:  [facility] GI (discussed there is currently no indication for intervention and would  reconsult them if patient's symptoms improved)  Procedures:  None  Antimicrobials:  Ceftriaxone , metronidazole   Subjective: No events overnight, O2 needs improving, down to 5 L high flow this morning  Objective: Vitals:   08/19/24 0900 08/19/24 1000 08/19/24 1010 08/19/24 1100   BP: (!) 140/68 (!) 151/69  (!) 163/61  Pulse: 74 91 87 82  Resp: (!) 22 (!) 23 (!) 22 (!) 24  Temp:      TempSrc:      SpO2: 94% 90% 92% 99%  Weight:      Height:        Intake/Output Summary (Last 24 hours) at 08/19/2024 1156 Last data filed at 08/19/2024 1000 Gross per 24 hour  Intake 853.89 ml  Output 2025 ml  Net -1171.11 ml   Filed Weights   08/12/24 1559 08/14/24 0400 08/17/24 2000  Weight: 60.5 kg 71.6 kg 67.6 kg    Examination:  General: Elderly chronically ill female sitting up in bed, AAO x 2, mild cognitive deficits HEENT: No JVD CVS: S1-S2, regular rhythm Lungs: Few scattered rhonchi, decreased at the bases Abdomen: Soft, nontender, bowel sounds present Extremities: Trace edema   Data Reviewed: I have personally reviewed following labs and imaging studies  CBC: Recent Labs  Lab 08/12/24 1618 08/13/24 0023 08/14/24 0048 08/15/24 0210 08/17/24 0350 08/18/24 0307 08/19/24 0254  WBC 8.8   < > 7.3 6.8 6.1 4.7 4.2  NEUTROABS 7.1  --   --   --   --   --   --   HGB 4.5*   < > 7.9* 7.8* 8.5* 8.2* 8.4*  HCT 14.1*   < > 23.7* 23.1* 25.0* 24.5* 25.8*  MCV 102.2*   < > 92.6 92.4 89.3 91.4 91.8  PLT 371   < > 277 259 284 279 314   < > = values in this interval not displayed.   Basic Metabolic Panel: Recent Labs  Lab 08/12/24 1618 08/13/24 0902 08/14/24 0709 08/15/24 0210 08/17/24 0350 08/17/24 1136 08/18/24 0307 08/19/24 0254  NA 133*   < > 136 138 135 130* 132* 135  K 4.8   < > 3.5 3.4* 2.2* 3.7 3.5 4.0  CL 102   < > 103 104 96* 95* 96* 98  CO2 23   < > 23 25 31 27 28 31   GLUCOSE 148*   < > 242* 132* 126* 165* 124* 103*  BUN 45*   < > 19 16 11 12 11 9   CREATININE 1.33*   < > 0.92 0.80 0.70 0.73 0.69 0.70  CALCIUM 8.9   < > 8.3* 8.5* 8.0* 7.8* 8.2* 8.1*  MG 1.9  --  1.7  --   --  1.5*  --   --   PHOS  --   --  2.9  --   --   --   --   --    < > = values in this interval not displayed.   GFR: Estimated Creatinine Clearance: 48.8 mL/min (by C-G  formula based on SCr of 0.7 mg/dL).  Liver Function Tests: Recent Labs  Lab 08/12/24 1618 08/13/24 0902  AST 28 32  ALT 29 29  ALKPHOS 35* 42  BILITOT 0.4 0.3  PROT 5.0* 4.8*  ALBUMIN 3.2* 3.0*   BNP (last 3 results) Recent Labs    08/12/24 1618  PROBNP 5,942.0*   CBG: Recent Labs  Lab 08/18/24 1927 08/18/24 2323 08/19/24 0339 08/19/24 0726 08/19/24 1140  GLUCAP 212* 166* 101* 119* 205*  Thyroid  Function Tests: No results for input(s): TSH, T4TOTAL, FREET4, T3FREE, THYROIDAB in the last 72 hours.  Sepsis Labs: Recent Labs  Lab 08/12/24 1617  LATICACIDVEN 1.1   Recent Results (from the past 240 hours)  Blood culture (routine x 2)     Status: None   Collection Time: 08/12/24  4:09 PM   Specimen: BLOOD  Result Value Ref Range Status   Specimen Description BLOOD SITE NOT SPECIFIED  Final   Special Requests   Final    BOTTLES DRAWN AEROBIC AND ANAEROBIC Blood Culture adequate volume   Culture   Final    NO GROWTH 5 DAYS Performed at [facility], [address], [place], [place] [address]    Report Status 08/17/2024 FINAL  Final  Blood culture (routine x 2)     Status: None   Collection Time: 08/12/24  4:14 PM   Specimen: BLOOD LEFT FOREARM  Result Value Ref Range Status   Specimen Description BLOOD LEFT FOREARM  Final   Special Requests   Final    BOTTLES DRAWN AEROBIC AND ANAEROBIC Blood Culture adequate volume   Culture   Final    NO GROWTH 5 DAYS Performed at [facility], [address] [place], [place] [address]    Report Status 08/17/2024 FINAL  Final  Resp panel by RT-PCR (RSV, Flu A&B, Covid) Anterior Nasal Swab     Status: Abnormal   Collection Time: 08/12/24  4:42 PM   Specimen: Anterior Nasal Swab  Result Value Ref Range Status   SARS Coronavirus 2 by RT PCR NEGATIVE NEGATIVE Final   Influenza A by PCR POSITIVE (A) NEGATIVE Final   Influenza B by PCR NEGATIVE NEGATIVE Final    Comment: (NOTE) The Xpert Xpress  SARS-CoV-2/FLU/RSV plus assay is intended as an aid in the diagnosis of influenza from Nasopharyngeal swab specimens and should not be used as a sole basis for treatment. Nasal washings and aspirates are unacceptable for Xpert Xpress SARS-CoV-2/FLU/RSV testing.  Fact Sheet for Patients: bloggercourse.com  Fact Sheet for Healthcare Providers: seriousbroker.it  This test is not yet approved or cleared by the [place]  FDA and has been authorized for detection and/or diagnosis of SARS-CoV-2 by FDA under an Emergency Use Authorization (EUA). This EUA will remain in effect (meaning this test can be used) for the duration of the COVID-19 declaration under Section 564(b)(1) of the Act, 21 U.S.C. section 360bbb-3(b)(1), unless the authorization is terminated or revoked.     Resp Syncytial Virus by PCR NEGATIVE NEGATIVE Final    Comment: (NOTE) Fact Sheet for Patients: bloggercourse.com  Fact Sheet for Healthcare Providers: seriousbroker.it  This test is not yet approved or cleared by the [place]  FDA and has been authorized for detection and/or diagnosis of SARS-CoV-2 by FDA under an Emergency Use Authorization (EUA). This EUA will remain in effect (meaning this test can be used) for the duration of the COVID-19 declaration under Section 564(b)(1) of the Act, 21 U.S.C. section 360bbb-3(b)(1), unless the authorization is terminated or revoked.  Performed at [facility], [address], [place], [place] [address]   Urine Culture     Status: None   Collection Time: 08/12/24  8:34 PM   Specimen: Urine, Catheterized  Result Value Ref Range Status   Specimen Description URINE, CATHETERIZED  Final   Special Requests NONE  Final   Culture   Final    NO GROWTH Performed at [facility], [address]  [place], [place] [address]    Report Status 08/14/2024 FINAL   Final  MRSA Next Gen by PCR, Nasal     Status: None   Collection Time: 08/12/24 11:21 PM   Specimen: Nasal Mucosa; Nasal Swab  Result Value Ref Range Status   MRSA by PCR Next Gen NOT DETECTED NOT DETECTED Final    Comment: (NOTE) The GeneXpert MRSA Assay (FDA approved for NASAL specimens only), is one component of a comprehensive MRSA colonization surveillance program. It is not intended to diagnose MRSA infection nor to guide or monitor treatment for MRSA infections. Test performance is not FDA approved in patients less than 45 years old. Performed at [facility], [address], [place], [place] [address]     Radiology Studies: DG CHEST PORT 1 VIEW Result Date: 08/18/2024 EXAM: 1 VIEW(S) XRAY OF THE CHEST 08/17/2024 12:37:21 PM COMPARISON: 08/12/2024 CLINICAL HISTORY: 200808 Hypoxia 200808 FINDINGS: LUNGS AND PLEURA: Low lung volumes. Bibasilar opacities. Increased bilateral pleural effusions. No pneumothorax. HEART AND MEDIASTINUM: Aortic atherosclerosis. No acute abnormality of the cardiac and mediastinal silhouettes. BONES AND SOFT TISSUES: Thoracic dextroscoliosis. IMPRESSION: 1. Low lung volumes with bibasilar opacities and increased bilateral pleural effusions. Electronically signed by: Greig Pique MD 08/18/2024 01:23 AM EST RP Workstation: [id number]     Scheduled Meds:  sodium chloride    Intravenous Once   atenolol   25 mg Oral QPM   Chlorhexidine  Gluconate Cloth  6 each Topical Daily   clonazePAM   0.5 mg Oral QHS   escitalopram   10 mg Oral Daily   furosemide   40 mg Intravenous BID   hydrALAZINE   50 mg Oral Q8H   insulin  aspart  0-5 Units Subcutaneous QHS   insulin  aspart  0-9 Units Subcutaneous TID WC   irbesartan   300 mg Oral Daily   pantoprazole   40 mg Oral Daily   polyethylene glycol  17 g Oral BID   Continuous Infusions:  heparin  950 Units/hr (08/19/24 1000)     LOS: 7 days   Time spent:   Sigurd Pac, MD [facility]  If 7PM-7AM,  please contact night-coverage [email or web address]  08/19/2024, 11:56 AM      "

## 2024-08-20 ENCOUNTER — Encounter (HOSPITAL_COMMUNITY): Payer: Self-pay | Admitting: Internal Medicine

## 2024-08-20 DIAGNOSIS — J9621 Acute and chronic respiratory failure with hypoxia: Secondary | ICD-10-CM | POA: Diagnosis not present

## 2024-08-20 LAB — CBC
HCT: 26.6 % — ABNORMAL LOW (ref 36.0–46.0)
Hemoglobin: 8.7 g/dL — ABNORMAL LOW (ref 12.0–15.0)
MCH: 30.5 pg (ref 26.0–34.0)
MCHC: 32.7 g/dL (ref 30.0–36.0)
MCV: 93.3 fL (ref 80.0–100.0)
Platelets: 395 K/uL (ref 150–400)
RBC: 2.85 MIL/uL — ABNORMAL LOW (ref 3.87–5.11)
RDW: 15.8 % — ABNORMAL HIGH (ref 11.5–15.5)
WBC: 6.3 K/uL (ref 4.0–10.5)
nRBC: 0 % (ref 0.0–0.2)

## 2024-08-20 LAB — GLUCOSE, CAPILLARY
Glucose-Capillary: 111 mg/dL — ABNORMAL HIGH (ref 70–99)
Glucose-Capillary: 198 mg/dL — ABNORMAL HIGH (ref 70–99)
Glucose-Capillary: 127 mg/dL — ABNORMAL HIGH (ref 70–99)
Glucose-Capillary: 192 mg/dL — ABNORMAL HIGH (ref 70–99)

## 2024-08-20 LAB — BASIC METABOLIC PANEL WITH GFR
Anion gap: 8 (ref 5–15)
BUN: 8 mg/dL (ref 8–23)
CO2: 29 mmol/L (ref 22–32)
Calcium: 8.7 mg/dL — ABNORMAL LOW (ref 8.9–10.3)
Chloride: 96 mmol/L — ABNORMAL LOW (ref 98–111)
Creatinine, Ser: 0.79 mg/dL (ref 0.44–1.00)
GFR, Estimated: >60 mL/min
Glucose, Bld: 193 mg/dL — ABNORMAL HIGH (ref 70–99)
Potassium: 3.8 mmol/L (ref 3.5–5.1)
Sodium: 133 mmol/L — ABNORMAL LOW (ref 135–145)

## 2024-08-20 LAB — HEPARIN LEVEL (UNFRACTIONATED): Heparin Unfractionated: 0.48 [IU]/mL (ref 0.30–0.70)

## 2024-08-20 MED ORDER — FUROSEMIDE 10 MG/ML IJ SOLN
40.0000 mg | Freq: Two times a day (BID) | INTRAMUSCULAR | Status: AC
  Administered 2024-08-20 (×2): 40 mg via INTRAVENOUS
  Filled 2024-08-20 (×2): qty 4

## 2024-08-20 MED ORDER — POTASSIUM CHLORIDE 20 MEQ PO PACK
40.0000 meq | PACK | Freq: Once | ORAL | Status: AC
  Administered 2024-08-20: 40 meq via ORAL
  Filled 2024-08-20: qty 2

## 2024-08-20 MED ORDER — POLYETHYLENE GLYCOL 3350 17 G PO PACK: 17.0000 g | PACK | Freq: Every day | ORAL | Status: DC | PRN

## 2024-08-20 NOTE — Progress Notes (Signed)
 " PROGRESS NOTE    Monica Faulkner  FMW:[id number] DOB: [birth year] DOA: 08/12/2024 PCP: Verdia Lombard, MD   Brief Narrative:  Monica Faulkner is a 84 y.o. female with medical history significant of basal cell carcinoma, type 2 diabetes, GERD, essential hypertension, anxiety disorder, depression.  Admitted on 08/12/2024 with diagnosis from rehab with the flu and pneumonia with severe hypoxia and respiratory distress.  Patient initially requiring nonrebreather and heated high flow to maintain sats above 90%.  Patient found to have profound anemia at 4.5 -FOBT positive but dark brown stool noted on exam.  She also has notable subacute PE and age-indeterminate DVT of the left peroneal vein complicating her symptoms. CT also notable for colitis - Admitted, started on Tamiflu  and antibiotics, CT abdomen concerning for colitis - Respiratory status slowly improving - No further bleeding, 1/1 started on IV heparin   Assessment & Plan:  Acute hypoxic respiratory failure Influenza pneumonia Bibasilar pneumonia, rule out bacterial component Subactute PE, age indeterminant DVT, L peroneal vein - Patient's respiratory status is multifactorial in the setting of subacute PE, influenza pneumonia with concern over superimposed bacterial pneumonia as well  - Completed 5 days of Tamiflu , completed 7 days of ABX - Was initially on on 20 L heated high flow, FiO2-45% - Iatrogenic fluid overload also contributing, continue IV Lasix  again today -Improving well, down to 4 L nasal cannula now - Initially had been unable to anticoagulate for PE in the setting of severe anemia and GI bleed, appears to be no further evidence of active GI bleed at this time, started on IV heparin  1/1, continue this for now, will discuss with GI regarding appropriateness for workup  Goals of care  - Dr.Lancaster had lengthy discussion with patient's brother Monica Faulkner over the weekend who is her default power of attorney as he is her  only living relative.  Now DNR - Clinically appears to be stabilizing now  Acute metabolic encephalopathy, multifactorial, POA  -Multifactorial, likely secondary to above, improving  Severe acute symptomatic anemia Acute GI bleed Acute colitis, ?  Ischemic - Dr.Lancaster Discussed case with GI, on admission, initially with severe hypoxia she was too unstable for any GI workup - Status post 2 unit PRBC on admission, now hemoglobin is stable - RX w metronidazole  and ceftriaxone  for presumed pneumonia as well, completed 7 days of ABX -No overt bleeding reported anymore -will d/w GI   Essential hypertension:  - Restart home medications as below including atenolol , hydralazine , irbesartan (substituted for home telmisartan)  AKI, resolved: Likely prerenal, downtrending appropriately with supportive care/transfusion  Severe hypokalemia Replete  Subacute pulmonary embolism:  - See discussion above  GERD: Continue with PPIs    DVT prophylaxis: SCDs Start: 08/12/24 2331 no chemical prophylaxis in the setting of GI bleed Code Status:   Code Status: Limited: Do not attempt resuscitation (DNR) -DNR-LIMITED -Do Not Intubate/DNI   Family Communication: No family at bedside today, called and updated brother Monica Faulkner yesterday  Status is: Inpatient  Dispo: The patient is from: [facility]               Anticipated d/c is to: SNF              Anticipated d/c date is: To be determined              Patient currently not medically stable for discharge  Consultants:  PCCM   Procedures:  None  Antimicrobials:  Ceftriaxone , metronidazole   Subjective: Feels fair, no events overnight, denies any  bleeding, no abdominal pain nausea vomiting, breathing continues to improve  Objective: Vitals:   08/20/24 0434 08/20/24 0514 08/20/24 0744 08/20/24 0813  BP: (!) 173/60 (!) 173/60 (!) 159/56   Pulse:   75 76  Resp:   14 15  Temp: 98 F (36.7 C)     TempSrc: Oral  Oral   SpO2:   92% 98%   Weight:      Height:        Intake/Output Summary (Last 24 hours) at 08/20/2024 1118 Last data filed at 08/20/2024 0815 Gross per 24 hour  Intake 1658 ml  Output 1700 ml  Net -42 ml   Filed Weights   08/12/24 1559 08/14/24 0400 08/17/24 2000  Weight: 60.5 kg 71.6 kg 67.6 kg    Examination:  General: Elderly chronically ill female sitting up in bed, AAO x 2, mild cognitive deficits HEENT: + JVD CVS: S1-S2, regular rhythm Lungs: Few scattered rhonchi, decreased at the bases Abdomen: Soft, nontender, bowel sounds present Extremities: Trace edema   Data Reviewed: I have personally reviewed following labs and imaging studies  CBC: Recent Labs  Lab 08/15/24 0210 08/17/24 0350 08/18/24 0307 08/19/24 0254 08/20/24 0535  WBC 6.8 6.1 4.7 4.2 6.3  HGB 7.8* 8.5* 8.2* 8.4* 8.7*  HCT 23.1* 25.0* 24.5* 25.8* 26.6*  MCV 92.4 89.3 91.4 91.8 93.3  PLT 259 284 279 314 395   Basic Metabolic Panel: Recent Labs  Lab 08/14/24 0709 08/15/24 0210 08/17/24 0350 08/17/24 1136 08/18/24 0307 08/19/24 0254  NA 136 138 135 130* 132* 135  K 3.5 3.4* 2.2* 3.7 3.5 4.0  CL 103 104 96* 95* 96* 98  CO2 23 25 31 27 28 31   GLUCOSE 242* 132* 126* 165* 124* 103*  BUN 19 16 11 12 11 9   CREATININE 0.92 0.80 0.70 0.73 0.69 0.70  CALCIUM 8.3* 8.5* 8.0* 7.8* 8.2* 8.1*  MG 1.7  --   --  1.5*  --   --   PHOS 2.9  --   --   --   --   --    GFR: Estimated Creatinine Clearance: 48.8 mL/min (by C-G formula based on SCr of 0.7 mg/dL).  Liver Function Tests: No results for input(s): AST, ALT, ALKPHOS, BILITOT, PROT, ALBUMIN in the last 168 hours.  BNP (last 3 results) Recent Labs    08/12/24 1618  PROBNP 5,942.0*   CBG: Recent Labs  Lab 08/19/24 0726 08/19/24 1140 08/19/24 1614 08/19/24 2112 08/20/24 0746  GLUCAP 119* 205* 167* 144* 111*   Thyroid  Function Tests: No results for input(s): TSH, T4TOTAL, FREET4, T3FREE, THYROIDAB in the last 72 hours.  Sepsis  Labs: No results for input(s): PROCALCITON, LATICACIDVEN in the last 168 hours.  Recent Results (from the past 240 hours)  Blood culture (routine x 2)     Status: None   Collection Time: 08/12/24  4:09 PM   Specimen: BLOOD  Result Value Ref Range Status   Specimen Description BLOOD SITE NOT SPECIFIED  Final   Special Requests   Final    BOTTLES DRAWN AEROBIC AND ANAEROBIC Blood Culture adequate volume   Culture   Final    NO GROWTH 5 DAYS Performed at [facility], [address] [place], [place] [address]    Report Status 08/17/2024 FINAL  Final  Blood culture (routine x 2)     Status: None   Collection Time: 08/12/24  4:14 PM   Specimen: BLOOD LEFT FOREARM  Result Value Ref Range  Status   Specimen Description BLOOD LEFT FOREARM  Final   Special Requests   Final    BOTTLES DRAWN AEROBIC AND ANAEROBIC Blood Culture adequate volume   Culture   Final    NO GROWTH 5 DAYS Performed at [facility], [address], [place], [place] [address]    Report Status 08/17/2024 FINAL  Final  Resp panel by RT-PCR (RSV, Flu A&B, Covid) Anterior Nasal Swab     Status: Abnormal   Collection Time: 08/12/24  4:42 PM   Specimen: Anterior Nasal Swab  Result Value Ref Range Status   SARS Coronavirus 2 by RT PCR NEGATIVE NEGATIVE Final   Influenza A by PCR POSITIVE (A) NEGATIVE Final   Influenza B by PCR NEGATIVE NEGATIVE Final    Comment: (NOTE) The Xpert Xpress SARS-CoV-2/FLU/RSV plus assay is intended as an aid in the diagnosis of influenza from Nasopharyngeal swab specimens and should not be used as a sole basis for treatment. Nasal washings and aspirates are unacceptable for Xpert Xpress SARS-CoV-2/FLU/RSV testing.  Fact Sheet for Patients: bloggercourse.com  Fact Sheet for Healthcare Providers: seriousbroker.it  This test is not yet approved or cleared by the [place]  FDA and has been authorized for detection and/or  diagnosis of SARS-CoV-2 by FDA under an Emergency Use Authorization (EUA). This EUA will remain in effect (meaning this test can be used) for the duration of the COVID-19 declaration under Section 564(b)(1) of the Act, 21 U.S.C. section 360bbb-3(b)(1), unless the authorization is terminated or revoked.     Resp Syncytial Virus by PCR NEGATIVE NEGATIVE Final    Comment: (NOTE) Fact Sheet for Patients: bloggercourse.com  Fact Sheet for Healthcare Providers: seriousbroker.it  This test is not yet approved or cleared by the [place]  FDA and has been authorized for detection and/or diagnosis of SARS-CoV-2 by FDA under an Emergency Use Authorization (EUA). This EUA will remain in effect (meaning this test can be used) for the duration of the COVID-19 declaration under Section 564(b)(1) of the Act, 21 U.S.C. section 360bbb-3(b)(1), unless the authorization is terminated or revoked.  Performed at [facility], [address], [place], [place] [address]   Urine Culture     Status: None   Collection Time: 08/12/24  8:34 PM   Specimen: Urine, Catheterized  Result Value Ref Range Status   Specimen Description URINE, CATHETERIZED  Final   Special Requests NONE  Final   Culture   Final    NO GROWTH Performed at [facility], [address], [place], [place] [address]    Report Status 08/14/2024 FINAL  Final  MRSA Next Gen by PCR, Nasal     Status: None   Collection Time: 08/12/24 11:21 PM   Specimen: Nasal Mucosa; Nasal Swab  Result Value Ref Range Status   MRSA by PCR Next Gen NOT DETECTED NOT DETECTED Final    Comment: (NOTE) The GeneXpert MRSA Assay (FDA approved for NASAL specimens only), is one component of a comprehensive MRSA colonization surveillance program. It is not intended to diagnose MRSA infection nor to guide or monitor treatment for MRSA infections. Test performance is not FDA approved in patients  less than 34 years old. Performed at [facility], [address], [place], [place] [address]     Radiology Studies: ECHOCARDIOGRAM COMPLETE Result Date: 08/19/2024    ECHOCARDIOGRAM REPORT   Patient Name:   Rhyse Zakirah Weingart Date of Exam: 08/19/2024 Medical Rec #:  [id number]  Height:       65.0 in Accession #:    [id number]         Weight:       149.0 lb Date of Birth:  02-Sep-1940           BSA:          1.746 m Patient Age:    83 years           BP:           149/68 mmHg Patient Gender: F                  HR:           84 bpm. Exam Location:  Inpatient Procedure: 2D Echo, Cardiac Doppler and Color Doppler (Both Spectral and Color            Flow Doppler were utilized during procedure). Indications:    CHF- Acute Diastolic I50.31  History:        Patient has no prior history of Echocardiogram examinations.                 Pulmonary Embolism; Risk Factors:Diabetes, Hypertension and                 GERD.  Sonographer:    Koleen Popper RDCS Referring Phys: SIGURD PAC  Sonographer Comments: Suboptimal parasternal window. Image acquisition challenging due to respiratory motion. IMPRESSIONS  1. Left ventricular ejection fraction, by estimation, is 60 to 65%. The left ventricle has normal function. The left ventricle has no regional wall motion abnormalities. Left ventricular diastolic parameters are consistent with Grade I diastolic dysfunction (impaired relaxation).  2. Right ventricular systolic function is normal. The right ventricular size is normal.  3. The mitral valve is normal in structure. No evidence of mitral valve regurgitation. No evidence of mitral stenosis.  4. The aortic valve is tricuspid. Aortic valve regurgitation is not visualized. No aortic stenosis is present.  5. The inferior vena cava is normal in size with greater than 50% respiratory variability, suggesting right atrial pressure of 3 mmHg. FINDINGS  Left Ventricle: Left ventricular ejection fraction, by estimation, is 60  to 65%. The left ventricle has normal function. The left ventricle has no regional wall motion abnormalities. The left ventricular internal cavity size was normal in size. There is  no left ventricular hypertrophy. Left ventricular diastolic parameters are consistent with Grade I diastolic dysfunction (impaired relaxation). Normal left ventricular filling pressure. Right Ventricle: The right ventricular size is normal. No increase in right ventricular wall thickness. Right ventricular systolic function is normal. Left Atrium: Left atrial size was normal in size. Right Atrium: Right atrial size was normal in size. Pericardium: There is no evidence of pericardial effusion. Mitral Valve: The mitral valve is normal in structure. No evidence of mitral valve regurgitation. No evidence of mitral valve stenosis. Tricuspid Valve: The tricuspid valve is normal in structure. Tricuspid valve regurgitation is not demonstrated. No evidence of tricuspid stenosis. Aortic Valve: The aortic valve is tricuspid. Aortic valve regurgitation is not visualized. No aortic stenosis is present. Pulmonic Valve: The pulmonic valve was normal in structure. Pulmonic valve regurgitation is not visualized. No evidence of pulmonic stenosis. Aorta: The aortic root is normal in size and structure. Venous: The inferior vena cava is normal in size with greater than 50% respiratory variability, suggesting right atrial pressure of 3 mmHg. IAS/Shunts: No atrial level shunt detected by color flow Doppler.  LEFT VENTRICLE PLAX 2D LVIDd:  4.40 cm      Diastology LVIDs:         2.40 cm      LV e' medial:    4.46 cm/s LV PW:         0.80 cm      LV E/e' medial:  16.0 LV IVS:        0.80 cm      LV e' lateral:   10.10 cm/s LVOT diam:     1.70 cm      LV E/e' lateral: 7.1 LV SV:         53 LV SV Index:   30 LVOT Area:     2.27 cm  LV Volumes (MOD) LV vol d, MOD A2C: 109.0 ml LV vol d, MOD A4C: 87.7 ml LV vol s, MOD A2C: 41.4 ml LV vol s, MOD A4C: 34.8 ml  LV SV MOD A2C:     67.6 ml LV SV MOD A4C:     87.7 ml LV SV MOD BP:      61.2 ml RIGHT VENTRICLE             IVC RV S prime:     14.80 cm/s  IVC diam: 1.20 cm TAPSE (M-mode): 1.6 cm LEFT ATRIUM             Index LA diam:        4.70 cm 2.69 cm/m LA Vol (A2C):   25.4 ml 14.55 ml/m LA Vol (A4C):   40.2 ml 23.03 ml/m LA Biplane Vol: 32.7 ml 18.73 ml/m  AORTIC VALVE LVOT Vmax:   134.00 cm/s LVOT Vmean:  85.600 cm/s LVOT VTI:    0.234 m  AORTA Ao Root diam: 2.60 cm MITRAL VALVE MV Area (PHT): 3.42 cm     SHUNTS MV Decel Time: 222 msec     Systemic VTI:  0.23 m MV E velocity: 71.30 cm/s   Systemic Diam: 1.70 cm MV A velocity: 106.00 cm/s MV E/A ratio:  0.67 Morene Brownie Electronically signed by Morene Brownie Signature Date/Time: 08/19/2024/1:27:34 PM    Final      Scheduled Meds:  sodium chloride    Intravenous Once   atenolol   25 mg Oral QPM   clonazePAM   0.5 mg Oral QHS   escitalopram   10 mg Oral Daily   furosemide   40 mg Intravenous BID   hydrALAZINE   50 mg Oral Q8H   insulin  aspart  0-5 Units Subcutaneous QHS   insulin  aspart  0-9 Units Subcutaneous TID WC   irbesartan   300 mg Oral Daily   pantoprazole   40 mg Oral Daily   polyethylene glycol  17 g Oral BID   Continuous Infusions:  heparin  950 Units/hr (08/19/24 1400)     LOS: 8 days   Time spent:   Sigurd Pac, MD [facility]  If 7PM-7AM, please contact night-coverage [email or web address]  08/20/2024, 11:18 AM      "

## 2024-08-20 NOTE — Progress Notes (Signed)
 Pt removed PIV x2 and heparin  was paused for a while. Unclear how long ago she removed PIVs. Also noted pt removed her purewick. Pt unable to recall what happened.  Loyce Blazing, RN

## 2024-08-20 NOTE — Progress Notes (Signed)
 PHARMACY - ANTICOAGULATION CONSULT NOTE  Pharmacy Consult for IV Heparin  Indication: pulmonary embolus  Allergies[1]  Patient Measurements: Height: 5' 5 (165.1 cm) Weight: 67.6 kg (149 lb 0.5 oz) IBW/kg (Calculated) : 57 HEPARIN  DW (KG): 60.5  Vital Signs: Temp: 98 F (36.7 C) (01/03 0434) Temp Source: Oral (01/03 0434) BP: 173/60 (01/03 0514) Pulse Rate: 67 (01/03 0100)  Labs: Recent Labs    08/17/24 1136 08/18/24 0307 08/18/24 0307 08/18/24 2220 08/19/24 0254 08/19/24 0902 08/20/24 0535  HGB  --  8.2*   < >  --  8.4*  --  8.7*  HCT  --  24.5*  --   --  25.8*  --  26.6*  PLT  --  279  --   --  314  --  395  HEPARINUNFRC  --   --   --  0.17*  --  0.60 0.48  CREATININE 0.73 0.69  --   --  0.70  --   --    < > = values in this interval not displayed.    Estimated Creatinine Clearance: 48.8 mL/min (by C-G formula based on SCr of 0.7 mg/dL).   Medical History: Past Medical History:  Diagnosis Date   Anxiety    seen at [facility], for extreme anxiety. As a result of her anxiety  she had ECHO   Cancer (HCC)    basal cell    Complication of anesthesia    atropine  increased BP & increased pulse   Depression    Diabetes mellitus    type 2- 2006, managed with diet   GERD (gastroesophageal reflux disease)    rare occurence   H/O hiatal hernia    Headache(784.0)    caused by sinus congestion fr. time to time    Hypertension     Medications:  Scheduled:   sodium chloride    Intravenous Once   atenolol   25 mg Oral QPM   Chlorhexidine  Gluconate Cloth  6 each Topical Daily   clonazePAM   0.5 mg Oral QHS   escitalopram   10 mg Oral Daily   hydrALAZINE   50 mg Oral Q8H   insulin  aspart  0-5 Units Subcutaneous QHS   insulin  aspart  0-9 Units Subcutaneous TID WC   irbesartan   300 mg Oral Daily   pantoprazole   40 mg Oral Daily   polyethylene glycol  17 g Oral BID   Infusions:   heparin  950 Units/hr (08/19/24 1400)    Assessment: 84 years of age female admitted with  Flu and pneumonia, found to have PE and colitis with GIB. Anticoagulation was held with Hgb down to 4.5 on admission and patient was transfused 2 units PRBCs. Hemoglobin has since stabilized around 8  (baseline 11) and no further bleeding reported. MD discussed with GI - no indication for intervention at this time. Pharmacy consulted to trial IV Heparin  for PE treatment - no bolus with recent bleeding. Plans are to titrate up slowly given recent bleed.   08/20/24: Heparin  level remains therapeutic at 0.48 on infusion of 950 units/h. Hgb stable in the 8's; platelets wnl. No issues with heparin  infusion or new s/sx bleeding reported.   Goal of Therapy:  Heparin  level 0.3-0.7 units/ml Monitor platelets by anticoagulation protocol: Yes   Plan:  Continue heparin  950 units/hr Check heparin  level with AM labs Monitor CBC and s/sx of bleeding daily  Maurilio Patten, PharmD PGY1 Pharmacy [facility] 08/20/2024 7:39 AM     [1]  Allergies Allergen Reactions   Avapro  [  Irbesartan ] Other (See Comments)    Difficulty breathing, feeling like she was going to pass out   Calcium Channel Blockers     ineffective   Decongestant [Pseudoephedrine] Other (See Comments)    Heart races , blood pressure goes up   Norvasc [Amlodipine Besylate] Itching, Other (See Comments) and Hypertension    Turned skin red   Sulfa Antibiotics Other (See Comments)    Made kidney infection worse   Zofran  [Ondansetron ] Diarrhea    Only ODT tablets cause this problem.

## 2024-08-21 DIAGNOSIS — J9621 Acute and chronic respiratory failure with hypoxia: Secondary | ICD-10-CM | POA: Diagnosis not present

## 2024-08-21 LAB — GLUCOSE, CAPILLARY
Glucose-Capillary: 138 mg/dL — ABNORMAL HIGH (ref 70–99)
Glucose-Capillary: 125 mg/dL — ABNORMAL HIGH (ref 70–99)
Glucose-Capillary: 161 mg/dL — ABNORMAL HIGH (ref 70–99)
Glucose-Capillary: 143 mg/dL — ABNORMAL HIGH (ref 70–99)

## 2024-08-21 LAB — CBC
HCT: 26.9 % — ABNORMAL LOW (ref 36.0–46.0)
Hemoglobin: 8.7 g/dL — ABNORMAL LOW (ref 12.0–15.0)
MCH: 30.2 pg (ref 26.0–34.0)
MCHC: 32.3 g/dL (ref 30.0–36.0)
MCV: 93.4 fL (ref 80.0–100.0)
Platelets: 381 K/uL (ref 150–400)
RBC: 2.88 MIL/uL — ABNORMAL LOW (ref 3.87–5.11)
RDW: 16 % — ABNORMAL HIGH (ref 11.5–15.5)
WBC: 6.1 K/uL (ref 4.0–10.5)
nRBC: 0 % (ref 0.0–0.2)

## 2024-08-21 LAB — BASIC METABOLIC PANEL WITH GFR
Anion gap: 7 (ref 5–15)
BUN: 8 mg/dL (ref 8–23)
CO2: 31 mmol/L (ref 22–32)
Calcium: 8.9 mg/dL (ref 8.9–10.3)
Chloride: 94 mmol/L — ABNORMAL LOW (ref 98–111)
Creatinine, Ser: 0.84 mg/dL (ref 0.44–1.00)
GFR, Estimated: >60 mL/min
Glucose, Bld: 139 mg/dL — ABNORMAL HIGH (ref 70–99)
Potassium: 3.6 mmol/L (ref 3.5–5.1)
Sodium: 132 mmol/L — ABNORMAL LOW (ref 135–145)

## 2024-08-21 LAB — HEPARIN LEVEL (UNFRACTIONATED): Heparin Unfractionated: 0.46 [IU]/mL (ref 0.30–0.70)

## 2024-08-21 MED ORDER — ENOXAPARIN SODIUM 80 MG/0.8ML IJ SOSY
70.0000 mg | PREFILLED_SYRINGE | Freq: Two times a day (BID) | INTRAMUSCULAR | Status: AC
  Administered 2024-08-21 – 2024-08-22 (×3): 70 mg via SUBCUTANEOUS
  Filled 2024-08-21 (×3): qty 0.8

## 2024-08-21 MED ORDER — FUROSEMIDE 10 MG/ML IJ SOLN
40.0000 mg | Freq: Once | INTRAMUSCULAR | Status: AC
  Administered 2024-08-21: 40 mg via INTRAVENOUS
  Filled 2024-08-21: qty 4

## 2024-08-21 NOTE — Progress Notes (Signed)
 " PROGRESS NOTE    Monica Faulkner  FMW:[id number] DOB: [birth year] DOA: 08/12/2024 PCP: Verdia Lombard, MD   Brief Narrative:  Monica Faulkner is a 84 y.o. female with medical history significant of basal cell carcinoma, type 2 diabetes, GERD, essential hypertension, anxiety disorder, depression.  Admitted on 08/12/2024 with diagnosis from rehab with the flu and pneumonia with severe hypoxia and respiratory distress.  Patient initially requiring nonrebreather and heated high flow to maintain sats above 90%.  Patient found to have profound anemia at 4.5 -FOBT positive but dark brown stool noted on exam.  She also has notable subacute PE and age-indeterminate DVT of the left peroneal vein complicating her symptoms. CT also notable for colitis - Admitted, started on Tamiflu  and antibiotics, CT abdomen concerning for colitis - Respiratory status slowly improving - No further bleeding, 1/1 started on IV heparin   Assessment & Plan:  Acute hypoxic respiratory failure Influenza pneumonia Bibasilar pneumonia, rule out bacterial component Subactute PE, age indeterminant DVT, L peroneal vein - Patient's respiratory status is multifactorial in the setting of subacute PE, influenza pneumonia with concern over superimposed bacterial pneumonia as well  - Completed 5 days of Tamiflu , completed 7 days of ABX - Was initially on on 20 L heated high flow, FiO2-45% - Iatrogenic fluid overload also contributing, continue IV Lasix  again today, switch to oral diuretics tomorrow - Improving well, weaned off O2 today - Initially had been unable to anticoagulate for PE in the setting of severe anemia and GI bleed, appears to be no further evidence of active GI bleed at this time, started on IV heparin  1/1, continue this for now, discussed with GI, they will evaluate tomorrow  Goals of care  - Dr.[place] had lengthy discussion with patient's brother [name] over the weekend, her default power of attorney as  he is her only living relative.  Now DNR - Clinically appears to be stabilizing now  Acute metabolic encephalopathy, multifactorial, POA  -Multifactorial, likely secondary to above, improving  Severe acute symptomatic anemia Acute GI bleed Acute colitis, ?  Ischemic - Dr.[place] Discussed case with GI, on admission, initially with severe hypoxia she was too unstable for any GI workup - Status post 2 unit PRBC on admission, now hemoglobin is stable - RX w metronidazole  and ceftriaxone  for presumed pneumonia as well, completed 7 days of ABX -No overt bleeding reported anymore - Discussed with GI, they will evaluate tomorrow for workup   Essential hypertension:  - Restart home medications as below including atenolol , hydralazine , irbesartan (substituted for home telmisartan)  AKI, resolved: Likely prerenal, downtrending appropriately with supportive care/transfusion  Severe hypokalemia Replete  Subacute pulmonary embolism:  - See discussion above  GERD: Continue with PPIs    DVT prophylaxis: SCDs Start: 08/12/24 2331 no chemical prophylaxis in the setting of GI bleed Code Status:   Code Status: Limited: Do not attempt resuscitation (DNR) -DNR-LIMITED -Do Not Intubate/DNI   Family Communication: No family at bedside today, called and updated brother [name] 1/2  Status is: Inpatient  Dispo: The patient is from: [facility] SNF               Anticipated d/c is to: SNF              Anticipated d/c date is: To be determined              Patient currently not medically stable for discharge  Consultants:  PCCM   Procedures:  None  Antimicrobials:  Ceftriaxone , metronidazole   Subjective:  For this better overall, breathing continues to improve, denies any bleeding, tolerating diet  Objective: Vitals:   08/21/24 0003 08/21/24 0501 08/21/24 0505 08/21/24 0750  BP: 131/65  (!) 136/50 (!) 140/49  Pulse: 72   72  Resp: 16 18  (!) 23  Temp: (!) 97.3 F (36.3 C) (!) 97.4 F  (36.3 C)  97.7 F (36.5 C)  TempSrc: Oral Oral  Oral  SpO2: 91% 93%    Weight:      Height:        Intake/Output Summary (Last 24 hours) at 08/21/2024 1058 Last data filed at 08/21/2024 0600 Gross per 24 hour  Intake 1478.35 ml  Output 1150 ml  Net 328.35 ml   Filed Weights   08/12/24 1559 08/14/24 0400 08/17/24 2000  Weight: 60.5 kg 71.6 kg 67.6 kg    Examination:  General: Early chronically ill female sitting up in bed, AAO x 2, mild cognitive deficits HEENT: Positive JVD CVS: S1-S2, regular rhythm Lungs: Scattered rhonchi, decreased at the bases  Abdomen: Soft, nontender, bowel sounds present Extremities: Trace edema   Data Reviewed: I have personally reviewed following labs and imaging studies  CBC: Recent Labs  Lab 08/17/24 0350 08/18/24 0307 08/19/24 0254 08/20/24 0535 08/21/24 0400  WBC 6.1 4.7 4.2 6.3 6.1  HGB 8.5* 8.2* 8.4* 8.7* 8.7*  HCT 25.0* 24.5* 25.8* 26.6* 26.9*  MCV 89.3 91.4 91.8 93.3 93.4  PLT 284 279 314 395 381   Basic Metabolic Panel: Recent Labs  Lab 08/17/24 1136 08/18/24 0307 08/19/24 0254 08/20/24 0949 08/21/24 0400  NA 130* 132* 135 133* 132*  K 3.7 3.5 4.0 3.8 3.6  CL 95* 96* 98 96* 94*  CO2 27 28 31 29 31   GLUCOSE 165* 124* 103* 193* 139*  BUN 12 11 9 8 8   CREATININE 0.73 0.69 0.70 0.79 0.84  CALCIUM 7.8* 8.2* 8.1* 8.7* 8.9  MG 1.5*  --   --   --   --    GFR: Estimated Creatinine Clearance: 45.7 mL/min (by C-G formula based on SCr of 0.84 mg/dL).  Liver Function Tests: No results for input(s): AST, ALT, ALKPHOS, BILITOT, PROT, ALBUMIN in the last 168 hours.  BNP (last 3 results) Recent Labs    08/12/24 1618  PROBNP 5,942.0*   CBG: Recent Labs  Lab 08/20/24 0746 08/20/24 1142 08/20/24 1549 08/20/24 2043 08/21/24 0747  GLUCAP 111* 198* 127* 192* 138*   Thyroid  Function Tests: No results for input(s): TSH, T4TOTAL, FREET4, T3FREE, THYROIDAB in the last 72 hours.  Sepsis Labs: No  results for input(s): PROCALCITON, LATICACIDVEN in the last 168 hours.  Recent Results (from the past 240 hours)  Blood culture (routine x 2)     Status: None   Collection Time: 08/12/24  4:09 PM   Specimen: BLOOD  Result Value Ref Range Status   Specimen Description BLOOD SITE NOT SPECIFIED  Final   Special Requests   Final    BOTTLES DRAWN AEROBIC AND ANAEROBIC Blood Culture adequate volume   Culture   Final    NO GROWTH 5 DAYS Performed at [facility], [address] [place], [place] [address]    Report Status 08/17/2024 FINAL  Final  Blood culture (routine x 2)     Status: None   Collection Time: 08/12/24  4:14 PM   Specimen: BLOOD LEFT FOREARM  Result Value Ref Range Status   Specimen Description BLOOD LEFT FOREARM  Final   Special Requests   Final  BOTTLES DRAWN AEROBIC AND ANAEROBIC Blood Culture adequate volume   Culture   Final    NO GROWTH 5 DAYS Performed at [facility], [address] [place], [place] [address]    Report Status 08/17/2024 FINAL  Final  Resp panel by RT-PCR (RSV, Flu A&B, Covid) Anterior Nasal Swab     Status: Abnormal   Collection Time: 08/12/24  4:42 PM   Specimen: Anterior Nasal Swab  Result Value Ref Range Status   SARS Coronavirus 2 by RT PCR NEGATIVE NEGATIVE Final   Influenza A by PCR POSITIVE (A) NEGATIVE Final   Influenza B by PCR NEGATIVE NEGATIVE Final    Comment: (NOTE) The Xpert Xpress SARS-CoV-2/FLU/RSV plus assay is intended as an aid in the diagnosis of influenza from Nasopharyngeal swab specimens and should not be used as a sole basis for treatment. Nasal washings and aspirates are unacceptable for Xpert Xpress SARS-CoV-2/FLU/RSV testing.  Fact Sheet for Patients: bloggercourse.com  Fact Sheet for Healthcare Providers: seriousbroker.it  This test is not yet approved or cleared by the [place]  FDA and has been authorized for detection and/or diagnosis  of SARS-CoV-2 by FDA under an Emergency Use Authorization (EUA). This EUA will remain in effect (meaning this test can be used) for the duration of the COVID-19 declaration under Section 564(b)(1) of the Act, 21 U.S.C. section 360bbb-3(b)(1), unless the authorization is terminated or revoked.     Resp Syncytial Virus by PCR NEGATIVE NEGATIVE Final    Comment: (NOTE) Fact Sheet for Patients: bloggercourse.com  Fact Sheet for Healthcare Providers: seriousbroker.it  This test is not yet approved or cleared by the [place]  FDA and has been authorized for detection and/or diagnosis of SARS-CoV-2 by FDA under an Emergency Use Authorization (EUA). This EUA will remain in effect (meaning this test can be used) for the duration of the COVID-19 declaration under Section 564(b)(1) of the Act, 21 U.S.C. section 360bbb-3(b)(1), unless the authorization is terminated or revoked.  Performed at [facility], [address] [place], [place] [address]   Urine Culture     Status: None   Collection Time: 08/12/24  8:34 PM   Specimen: Urine, Catheterized  Result Value Ref Range Status   Specimen Description URINE, CATHETERIZED  Final   Special Requests NONE  Final   Culture   Final    NO GROWTH Performed at [facility], [address] [place], [place] [address]    Report Status 08/14/2024 FINAL  Final  MRSA Next Gen by PCR, Nasal     Status: None   Collection Time: 08/12/24 11:21 PM   Specimen: Nasal Mucosa; Nasal Swab  Result Value Ref Range Status   MRSA by PCR Next Gen NOT DETECTED NOT DETECTED Final    Comment: (NOTE) The GeneXpert MRSA Assay (FDA approved for NASAL specimens only), is one component of a comprehensive MRSA colonization surveillance program. It is not intended to diagnose MRSA infection nor to guide or monitor treatment for MRSA infections. Test performance is not FDA approved in patients less than 75  years old. Performed at [facility], [address], [place], [place] [address]     Radiology Studies: ECHOCARDIOGRAM COMPLETE Result Date: 08/19/2024    ECHOCARDIOGRAM REPORT   Patient Name:   Kenlea Cortana Vanderford Date of Exam: 08/19/2024 Medical Rec #:  [id number]          Height:       65.0 in Accession #:  [id number]         Weight:       149.0 lb Date of Birth:  09-06-1940           BSA:          1.746 m Patient Age:    83 years           BP:           149/68 mmHg Patient Gender: F                  HR:           84 bpm. Exam Location:  Inpatient Procedure: 2D Echo, Cardiac Doppler and Color Doppler (Both Spectral and Color            Flow Doppler were utilized during procedure). Indications:    CHF- Acute Diastolic I50.31  History:        Patient has no prior history of Echocardiogram examinations.                 Pulmonary Embolism; Risk Factors:Diabetes, Hypertension and                 GERD.  Sonographer:    Koleen Popper RDCS Referring Phys: SIGURD PAC  Sonographer Comments: Suboptimal parasternal window. Image acquisition challenging due to respiratory motion. IMPRESSIONS  1. Left ventricular ejection fraction, by estimation, is 60 to 65%. The left ventricle has normal function. The left ventricle has no regional wall motion abnormalities. Left ventricular diastolic parameters are consistent with Grade I diastolic dysfunction (impaired relaxation).  2. Right ventricular systolic function is normal. The right ventricular size is normal.  3. The mitral valve is normal in structure. No evidence of mitral valve regurgitation. No evidence of mitral stenosis.  4. The aortic valve is tricuspid. Aortic valve regurgitation is not visualized. No aortic stenosis is present.  5. The inferior vena cava is normal in size with greater than 50% respiratory variability, suggesting right atrial pressure of 3 mmHg. FINDINGS  Left Ventricle: Left ventricular ejection fraction, by estimation, is 60 to 65%. The  left ventricle has normal function. The left ventricle has no regional wall motion abnormalities. The left ventricular internal cavity size was normal in size. There is  no left ventricular hypertrophy. Left ventricular diastolic parameters are consistent with Grade I diastolic dysfunction (impaired relaxation). Normal left ventricular filling pressure. Right Ventricle: The right ventricular size is normal. No increase in right ventricular wall thickness. Right ventricular systolic function is normal. Left Atrium: Left atrial size was normal in size. Right Atrium: Right atrial size was normal in size. Pericardium: There is no evidence of pericardial effusion. Mitral Valve: The mitral valve is normal in structure. No evidence of mitral valve regurgitation. No evidence of mitral valve stenosis. Tricuspid Valve: The tricuspid valve is normal in structure. Tricuspid valve regurgitation is not demonstrated. No evidence of tricuspid stenosis. Aortic Valve: The aortic valve is tricuspid. Aortic valve regurgitation is not visualized. No aortic stenosis is present. Pulmonic Valve: The pulmonic valve was normal in structure. Pulmonic valve regurgitation is not visualized. No evidence of pulmonic stenosis. Aorta: The aortic root is normal in size and structure. Venous: The inferior vena cava is normal in size with greater than 50% respiratory variability, suggesting right atrial pressure of 3 mmHg. IAS/Shunts: No atrial level shunt detected by color flow Doppler.  LEFT VENTRICLE PLAX 2D LVIDd:         4.40 cm  Diastology LVIDs:         2.40 cm      LV e' medial:    4.46 cm/s LV PW:         0.80 cm      LV E/e' medial:  16.0 LV IVS:        0.80 cm      LV e' lateral:   10.10 cm/s LVOT diam:     1.70 cm      LV E/e' lateral: 7.1 LV SV:         53 LV SV Index:   30 LVOT Area:     2.27 cm  LV Volumes (MOD) LV vol d, MOD A2C: 109.0 ml LV vol d, MOD A4C: 87.7 ml LV vol s, MOD A2C: 41.4 ml LV vol s, MOD A4C: 34.8 ml LV SV MOD  A2C:     67.6 ml LV SV MOD A4C:     87.7 ml LV SV MOD BP:      61.2 ml RIGHT VENTRICLE             IVC RV S prime:     14.80 cm/s  IVC diam: 1.20 cm TAPSE (M-mode): 1.6 cm LEFT ATRIUM             Index LA diam:        4.70 cm 2.69 cm/m LA Vol (A2C):   25.4 ml 14.55 ml/m LA Vol (A4C):   40.2 ml 23.03 ml/m LA Biplane Vol: 32.7 ml 18.73 ml/m  AORTIC VALVE LVOT Vmax:   134.00 cm/s LVOT Vmean:  85.600 cm/s LVOT VTI:    0.234 m  AORTA Ao Root diam: 2.60 cm MITRAL VALVE MV Area (PHT): 3.42 cm     SHUNTS MV Decel Time: 222 msec     Systemic VTI:  0.23 m MV E velocity: 71.30 cm/s   Systemic Diam: 1.70 cm MV A velocity: 106.00 cm/s MV E/A ratio:  0.67 Morene Brownie Electronically signed by Morene Brownie Signature Date/Time: 08/19/2024/1:27:34 PM    Final      Scheduled Meds:  sodium chloride    Intravenous Once   atenolol   25 mg Oral QPM   clonazePAM   0.5 mg Oral QHS   escitalopram   10 mg Oral Daily   hydrALAZINE   50 mg Oral Q8H   insulin  aspart  0-5 Units Subcutaneous QHS   insulin  aspart  0-9 Units Subcutaneous TID WC   irbesartan   300 mg Oral Daily   pantoprazole   40 mg Oral Daily   Continuous Infusions:  heparin  950 Units/hr (08/20/24 1718)     LOS: 9 days   Time spent:   Sigurd Pac, MD [facility]  If 7PM-7AM, please contact night-coverage [email or web address]  08/21/2024, 10:58 AM      "

## 2024-08-21 NOTE — Progress Notes (Signed)
 PHARMACY - ANTICOAGULATION CONSULT NOTE  Pharmacy Consult for IV Heparin  Indication: pulmonary embolus  Allergies[1]  Patient Measurements: Height: 5' 5 (165.1 cm) Weight: 67.6 kg (149 lb 0.5 oz) IBW/kg (Calculated) : 57 HEPARIN  DW (KG): 60.5  Vital Signs: Temp: 97.7 F (36.5 C) (01/04 0750) Temp Source: Oral (01/04 0750) BP: 140/49 (01/04 0750) Pulse Rate: 72 (01/04 0750)  Labs: Recent Labs    08/19/24 0254 08/19/24 0902 08/20/24 0535 08/20/24 0949 08/21/24 0400  HGB 8.4*  --  8.7*  --  8.7*  HCT 25.8*  --  26.6*  --  26.9*  PLT 314  --  395  --  381  HEPARINUNFRC  --  0.60 0.48  --  0.46  CREATININE 0.70  --   --  0.79 0.84    Estimated Creatinine Clearance: 45.7 mL/min (by C-G formula based on SCr of 0.84 mg/dL).   Medical History: Past Medical History:  Diagnosis Date   Anxiety    seen at [facility], for extreme anxiety. As a result of her anxiety  she had ECHO   Cancer (HCC)    basal cell    Complication of anesthesia    atropine  increased BP & increased pulse   Depression    Diabetes mellitus    type 2- 2006, managed with diet   GERD (gastroesophageal reflux disease)    rare occurence   H/O hiatal hernia    Headache(784.0)    caused by sinus congestion fr. time to time    Hypertension     Medications:  Scheduled:   sodium chloride    Intravenous Once   atenolol   25 mg Oral QPM   clonazePAM   0.5 mg Oral QHS   escitalopram   10 mg Oral Daily   hydrALAZINE   50 mg Oral Q8H   insulin  aspart  0-5 Units Subcutaneous QHS   insulin  aspart  0-9 Units Subcutaneous TID WC   irbesartan   300 mg Oral Daily   pantoprazole   40 mg Oral Daily   Infusions:   heparin  950 Units/hr (08/20/24 1718)    Assessment: 84 years of age female admitted with Flu and pneumonia, found to have PE and colitis with GIB. Anticoagulation was held with Hgb down to 4.5 on admission and patient was transfused 2 units PRBCs. Hemoglobin has since stabilized around 8  (baseline 11) and no  further bleeding reported. MD discussed with GI - no indication for intervention at this time. Pharmacy consulted to trial IV Heparin  for PE treatment - no bolus with recent bleeding. Plans are to titrate up slowly given recent bleed.   08/21/24: Heparin  level remains therapeutic at 0.46 on infusion of 950 units/h. Hgb stable in the 8's; platelets wnl. Heparin  paused yesterday afternoon due to PIV being removed by patient, but was restarted and no other issues with heparin  infusion or new s/sx bleeding reported.   Goal of Therapy:  Heparin  level 0.3-0.7 units/ml Monitor platelets by anticoagulation protocol: Yes   Plan:  Continue heparin  950 units/hr Check heparin  level with AM labs Monitor CBC and s/sx of bleeding daily Follow up for opportunity to transition to po anticoagulation  Jaxon Mynhier, PharmD PGY1 Pharmacy Resident [facility] 08/21/2024 9:14 AM    [1]  Allergies Allergen Reactions   Avapro  [Irbesartan ] Other (See Comments)    Difficulty breathing, feeling like she was going to pass out   Calcium Channel Blockers     ineffective   Decongestant [Pseudoephedrine] Other (See Comments)    Heart races , blood  pressure goes up   Norvasc [Amlodipine Besylate] Itching, Other (See Comments) and Hypertension    Turned skin red   Sulfa Antibiotics Other (See Comments)    Made kidney infection worse   Zofran  [Ondansetron ] Diarrhea    Only ODT tablets cause this problem.

## 2024-08-21 NOTE — Progress Notes (Signed)
 PHARMACY - ANTICOAGULATION CONSULT NOTE  Pharmacy Consult for IV Heparin  >> enoxaparin   Indication: pulmonary embolus  Allergies[1]  Patient Measurements: Height: 5' 5 (165.1 cm) Weight: 67.6 kg (149 lb 0.5 oz) IBW/kg (Calculated) : 57 HEPARIN  DW (KG): 60.5  Vital Signs: Temp: 97.5 F (36.4 C) (01/04 1146) Temp Source: Oral (01/04 1146) BP: 167/87 (01/04 1146) Pulse Rate: 79 (01/04 1146)  Labs: Recent Labs    08/19/24 0254 08/19/24 0902 08/20/24 0535 08/20/24 0949 08/21/24 0400  HGB 8.4*  --  8.7*  --  8.7*  HCT 25.8*  --  26.6*  --  26.9*  PLT 314  --  395  --  381  HEPARINUNFRC  --  0.60 0.48  --  0.46  CREATININE 0.70  --   --  0.79 0.84    Estimated Creatinine Clearance: 45.7 mL/min (by C-G formula based on SCr of 0.84 mg/dL).   Medical History: Past Medical History:  Diagnosis Date   Anxiety    seen at [facility], for extreme anxiety. As a result of her anxiety  she had ECHO   Cancer (HCC)    basal cell    Complication of anesthesia    atropine  increased BP & increased pulse   Depression    Diabetes mellitus    type 2- 2006, managed with diet   GERD (gastroesophageal reflux disease)    rare occurence   H/O hiatal hernia    Headache(784.0)    caused by sinus congestion fr. time to time    Hypertension     Medications:  Scheduled:   sodium chloride    Intravenous Once   atenolol   25 mg Oral QPM   clonazePAM   0.5 mg Oral QHS   escitalopram   10 mg Oral Daily   hydrALAZINE   50 mg Oral Q8H   insulin  aspart  0-5 Units Subcutaneous QHS   insulin  aspart  0-9 Units Subcutaneous TID WC   irbesartan   300 mg Oral Daily   pantoprazole   40 mg Oral Daily   Infusions:     Assessment: 84 years of age female admitted with Flu and pneumonia, found to have PE and colitis with GIB. Anticoagulation was held with Hgb down to 4.5 on admission and patient was transfused 2 units PRBCs. Hemoglobin has since stabilized around 8  (baseline 11) and no further bleeding  reported. MD discussed with GI - no indication for intervention at this time. Pharmacy consulted to trial IV Heparin  for PE treatment - no bolus with recent bleeding. Plans are to titrate up slowly given recent bleed.   08/21/24: Heparin  level remains therapeutic at 0.46 on infusion of 950 units/h. Hgb stable in the 8's; platelets wnl. Heparin  paused yesterday afternoon due to PIV being removed by patient, but was restarted and no other issues with heparin  infusion or new s/sx bleeding reported.   Consulted to switch heparin  GTT to LMWH.   Goal of Therapy:  Heparin  level 0.3-0.7 units/ml Monitor platelets by anticoagulation protocol: Yes   Plan:  Stop heparin , start enoxaparin  70mg  (1mg /kg) q12h.  Monitor CBC and s/sx of bleeding daily Follow up for opportunity to transition to po anticoagulation  Powell Blush, PharmD, BCCCP  [facility] 08/21/2024 4:17 PM     [1]  Allergies Allergen Reactions   Avapro  [Irbesartan ] Other (See Comments)    Difficulty breathing, feeling like she was going to pass out   Calcium Channel Blockers     ineffective   Decongestant [Pseudoephedrine] Other (See Comments)  Heart races , blood pressure goes up   Norvasc [Amlodipine Besylate] Itching, Other (See Comments) and Hypertension    Turned skin red   Sulfa Antibiotics Other (See Comments)    Made kidney infection worse   Zofran  [Ondansetron ] Diarrhea    Only ODT tablets cause this problem.

## 2024-08-21 NOTE — Plan of Care (Signed)

## 2024-08-22 DIAGNOSIS — D62 Acute posthemorrhagic anemia: Secondary | ICD-10-CM

## 2024-08-22 DIAGNOSIS — J9621 Acute and chronic respiratory failure with hypoxia: Secondary | ICD-10-CM | POA: Diagnosis not present

## 2024-08-22 DIAGNOSIS — R195 Other fecal abnormalities: Secondary | ICD-10-CM

## 2024-08-22 DIAGNOSIS — J09X1 Influenza due to identified novel influenza A virus with pneumonia: Secondary | ICD-10-CM | POA: Diagnosis not present

## 2024-08-22 DIAGNOSIS — I2699 Other pulmonary embolism without acute cor pulmonale: Secondary | ICD-10-CM

## 2024-08-22 DIAGNOSIS — R933 Abnormal findings on diagnostic imaging of other parts of digestive tract: Secondary | ICD-10-CM

## 2024-08-22 DIAGNOSIS — K922 Gastrointestinal hemorrhage, unspecified: Secondary | ICD-10-CM | POA: Diagnosis not present

## 2024-08-22 LAB — GLUCOSE, CAPILLARY
Glucose-Capillary: 179 mg/dL — ABNORMAL HIGH (ref 70–99)
Glucose-Capillary: 228 mg/dL — ABNORMAL HIGH (ref 70–99)
Glucose-Capillary: 193 mg/dL — ABNORMAL HIGH (ref 70–99)
Glucose-Capillary: 185 mg/dL — ABNORMAL HIGH (ref 70–99)

## 2024-08-22 LAB — BASIC METABOLIC PANEL WITH GFR
Anion gap: 11 (ref 5–15)
BUN: 12 mg/dL (ref 8–23)
CO2: 27 mmol/L (ref 22–32)
Calcium: 9.2 mg/dL (ref 8.9–10.3)
Chloride: 95 mmol/L — ABNORMAL LOW (ref 98–111)
Creatinine, Ser: 0.92 mg/dL (ref 0.44–1.00)
GFR, Estimated: >60 mL/min
Glucose, Bld: 138 mg/dL — ABNORMAL HIGH (ref 70–99)
Potassium: 3.5 mmol/L (ref 3.5–5.1)
Sodium: 133 mmol/L — ABNORMAL LOW (ref 135–145)

## 2024-08-22 LAB — CBC
HCT: 27.3 % — ABNORMAL LOW (ref 36.0–46.0)
Hemoglobin: 8.9 g/dL — ABNORMAL LOW (ref 12.0–15.0)
MCH: 30.3 pg (ref 26.0–34.0)
MCHC: 32.6 g/dL (ref 30.0–36.0)
MCV: 92.9 fL (ref 80.0–100.0)
Platelets: 384 K/uL (ref 150–400)
RBC: 2.94 MIL/uL — ABNORMAL LOW (ref 3.87–5.11)
RDW: 16.3 % — ABNORMAL HIGH (ref 11.5–15.5)
WBC: 5.9 K/uL (ref 4.0–10.5)
nRBC: 0 % (ref 0.0–0.2)

## 2024-08-22 MED ORDER — FUROSEMIDE 40 MG PO TABS
40.0000 mg | ORAL_TABLET | Freq: Every day | ORAL | Status: DC
  Administered 2024-08-22 – 2024-08-25 (×4): 40 mg via ORAL
  Filled 2024-08-22 (×4): qty 1

## 2024-08-22 MED ORDER — HYDRALAZINE HCL 25 MG PO TABS
25.0000 mg | ORAL_TABLET | Freq: Three times a day (TID) | ORAL | Status: DC
  Administered 2024-08-22 – 2024-08-25 (×9): 25 mg via ORAL
  Filled 2024-08-22 (×10): qty 1

## 2024-08-22 NOTE — Consult Note (Addendum)
 "                                               Consultation Note   Referring Provider:   [facility] PCP: Verdia Lombard, MD Primary Gastroenterologist:   Sampson      Reason for Consultation: Anemia / FOBT+ DOA: 08/12/2024         Hospital Day: 11   Assessment and Plan:  84 year old female with acute hypoxic respiratory failure in setting of pneumonia , influenza A , severe anemia.   Severe anemia / FOBT+ Presenting hemoglobin 4.5, down from 13 just 2 weeks prior.  Hemoglobin improved to 8.7 after only 2 units of blood.  Though her hemoglobin has clearly dropped compared to baseline, not sure the 4.5 on admit was accurate.  Patient denies overt GI blood loss but apparently at some point there was dark brown stool (heme positive) on exam.  Rule out erosive disease PUD. Schedule for EGD tomorrow. The risks and benefits of EGD with possible biopsies were discussed with the patient who agrees to proceed.   Continue daily pantoprazole   Monitor H&H Please hold Lovenox  after today's dose (will message Hospitalist) N.p.o. after midnight  Abnormal left colon on CT angio There is circumferential mild bowel wall thickening and pericolonic inflammatory stranding involving the descending colon. The examination is limited as venous phase imaging was not performed.   She denies symptoms of infectious colitis prior to admission (diarrhea, abdominal pain).  Ischemic colitis possible but would be unlikely without abdominal pain  /overt blood in stool . Her white count was elevated on admission but she also had influenza, pneumonia.  She received 5 days of Flagyl  Could consider flexible sigmoidoscopy at time of upper endoscopy though I did not discuss this with the patient today as she was already hesitant to proceed with upper endoscopy.  Another option would be to repeat abdominal imaging in a few days for reevaluation of colon findings on prior CT scan  Influenza  A Pneumonia Started on Tamiflu  and antibiotics.  Respiratory status has improved.  No supplemental oxygen requirements  PE, subacute or chronic On Sq Lovenox   Type 2 diabetes  Hypertension  Cholelithiasis, incidental finding on imaging  See PMH for any additional medical history  / medical problems  Principal Problem:   Acute on chronic hypoxic respiratory failure (HCC) Active Problems:   Hyponatremia   Acute metabolic encephalopathy   Controlled type 2 diabetes mellitus without complication, without long-term current use of insulin  (HCC)   Essential hypertension   GERD (gastroesophageal reflux disease)   GI bleed   Symptomatic anemia   Acute colitis   Acute pulmonary embolism (HCC)   Influenza A with pneumonia    HPI   Brief History:  Monica Faulkner was hospitalized last month with sepsis, influenza, and pneumonia.  She was discharged on 07/28/2024 to [facility] rehab.  Patient was brought to the ED 08/12/2024 with respiratory distress.  Workup remarkable for influenza A, pneumonia, PE, and new and severe anemia.  Patient denies any overt GI blood loss.  She tells me her stools are light brown and that she is sure about this because she does her own toileting.  However in the records it is noted that on exam she had dark Hemoccult positive stools.  She denies any blood loss from any source including hematuria , vaginal bleeding ,  nosebleeds etc. She denies abdominal pain, nausea / vomiting.  Monica Faulkner tells me she has never had an upper endoscopy or colonoscopy.     Monica Faulkner has a brother that lives in [place] , no other family in [place]                                                  Recent Labs    08/20/24 0535 08/21/24 0400 08/22/24 0510  WBC 6.3 6.1 5.9  HGB 8.7* 8.7* 8.9*  HCT 26.6* 26.9* 27.3*  MCV 93.3 93.4 92.9  PLT 395 381 384   Recent Labs    08/20/24 0949 08/21/24 0400 08/22/24 0510  NA 133* 132* 133*  K 3.8 3.6 3.5  CL 96* 94* 95*  CO2 29 31 27    GLUCOSE 193* 139* 138*  BUN 8 8 12   CREATININE 0.79 0.84 0.92  CALCIUM 8.7* 8.9 9.2     ECHOCARDIOGRAM COMPLETE    ECHOCARDIOGRAM REPORT       Patient Name:   Monica Faulkner Date of Exam: 08/19/2024 Medical Rec #:  [id number]          Height:       65.0 in Accession #:    [id number]         Weight:       149.0 lb Date of Birth:  12-19-1940           BSA:          1.746 m Patient Age:    83 years           BP:           149/68 mmHg Patient Gender: F                  HR:           84 bpm. Exam Location:  Inpatient  Procedure: 2D Echo, Cardiac Doppler and Color Doppler (Both Spectral and Color            Flow Doppler were utilized during procedure).  Indications:    CHF- Acute Diastolic I50.31   History:        Patient has no prior history of Echocardiogram examinations.                 Pulmonary Embolism; Risk Factors:Diabetes, Hypertension and                 GERD.   Sonographer:    Koleen Popper RDCS Referring Phys: SIGURD PAC    Sonographer Comments: Suboptimal parasternal window. Image acquisition challenging due to respiratory motion. IMPRESSIONS   1. Left ventricular ejection fraction, by estimation, is 60 to 65%. The left ventricle has normal function. The left ventricle has no regional wall motion abnormalities. Left ventricular diastolic parameters are consistent with Grade I diastolic  dysfunction (impaired relaxation).  2. Right ventricular systolic function is normal. The right ventricular size is normal.  3. The mitral valve is normal in structure. No evidence of mitral valve regurgitation. No evidence of mitral stenosis.  4. The aortic valve is tricuspid. Aortic valve regurgitation is not visualized. No aortic stenosis is present.  5. The inferior vena cava is normal in size with greater than 50% respiratory variability, suggesting right atrial pressure of 3 mmHg.  FINDINGS  Left Ventricle: Left ventricular ejection fraction, by estimation,  is 60 to 65%.  The left ventricle has normal function. The left ventricle has no regional wall motion abnormalities. The left ventricular internal cavity size was normal in size. There is  no left ventricular hypertrophy. Left ventricular diastolic parameters are consistent with Grade I diastolic dysfunction (impaired relaxation). Normal left ventricular filling pressure.  Right Ventricle: The right ventricular size is normal. No increase in right ventricular wall thickness. Right ventricular systolic function is normal.  Left Atrium: Left atrial size was normal in size.  Right Atrium: Right atrial size was normal in size.  Pericardium: There is no evidence of pericardial effusion.  Mitral Valve: The mitral valve is normal in structure. No evidence of mitral valve regurgitation. No evidence of mitral valve stenosis.  Tricuspid Valve: The tricuspid valve is normal in structure. Tricuspid valve regurgitation is not demonstrated. No evidence of tricuspid stenosis.  Aortic Valve: The aortic valve is tricuspid. Aortic valve regurgitation is not visualized. No aortic stenosis is present.  Pulmonic Valve: The pulmonic valve was normal in structure. Pulmonic valve regurgitation is not visualized. No evidence of pulmonic stenosis.  Aorta: The aortic root is normal in size and structure.  Venous: The inferior vena cava is normal in size with greater than 50% respiratory variability, suggesting right atrial pressure of 3 mmHg.  IAS/Shunts: No atrial level shunt detected by color flow Doppler.    LEFT VENTRICLE PLAX 2D LVIDd:         4.40 cm      Diastology LVIDs:         2.40 cm      LV e' medial:    4.46 cm/s LV PW:         0.80 cm      LV E/e' medial:  16.0 LV IVS:        0.80 cm      LV e' lateral:   10.10 cm/s LVOT diam:     1.70 cm      LV E/e' lateral: 7.1 LV SV:         53 LV SV Index:   30 LVOT Area:     2.27 cm   LV Volumes (MOD) LV vol d, MOD A2C: 109.0 ml LV vol d, MOD A4C: 87.7 ml LV vol  s, MOD A2C: 41.4 ml LV vol s, MOD A4C: 34.8 ml LV SV MOD A2C:     67.6 ml LV SV MOD A4C:     87.7 ml LV SV MOD BP:      61.2 ml  RIGHT VENTRICLE             IVC RV S prime:     14.80 cm/s  IVC diam: 1.20 cm TAPSE (M-mode): 1.6 cm  LEFT ATRIUM             Index LA diam:        4.70 cm 2.69 cm/m LA Vol (A2C):   25.4 ml 14.55 ml/m LA Vol (A4C):   40.2 ml 23.03 ml/m LA Biplane Vol: 32.7 ml 18.73 ml/m  AORTIC VALVE LVOT Vmax:   134.00 cm/s LVOT Vmean:  85.600 cm/s LVOT VTI:    0.234 m   AORTA Ao Root diam: 2.60 cm  MITRAL VALVE MV Area (PHT): 3.42 cm     SHUNTS MV Decel Time: 222 msec     Systemic VTI:  0.23 m MV E velocity: 71.30 cm/s   Systemic Diam: 1.70 cm MV A velocity: 106.00 cm/s MV E/A ratio:  0.67  Morene Brownie Electronically signed by Morene Brownie Signature Date/Time: 08/19/2024/1:27:34 PM      Final     Prior Endoscopic Evaluations:    None   Review of Systems: All systems reviewed and negative except where noted in HPI.  Physical Exam: Vital signs in last 24 hours: Temp:  [97.5 F (36.4 C)-98.1 F (36.7 C)] 97.6 F (36.4 C) (01/05 0753) Pulse Rate:  [70-91] 91 (01/05 0753) Resp:  [19-25] 20 (01/05 0753) BP: (136-167)/(55-87) 148/61 (01/05 0753) SpO2:  [91 %-100 %] 91 % (01/05 0753)   General:  Pleasant female in NAD Psych:  Cooperative. Normal mood and affect Eyes: Pupils equal Ears:  Normal auditory acuity Nose: No deformity, discharge or lesions Neck:  Supple, no masses felt Lungs: Inspiratory crackles at bilateral bases clear to auscultation.  Heart:  Regular rate.  Abdomen:  Soft, nondistended, nontender, active bowel sounds, no masses felt Rectal :  Deferred Msk: Symmetrical without gross deformities.  Neurologic:  Alert, oriented, grossly normal neurologically Extremities : No edema Skin:  Intact without significant lesions.   OUTPATIENT MEDICATIONS Prior to Admission medications  Medication Sig Start Date End Date  Taking? Authorizing Provider  amoxicillin -clavulanate (AUGMENTIN ) 500-125 MG tablet Take 1 tablet by mouth in the morning and at bedtime.   Yes [provider]  atenolol  (TENORMIN ) 25 MG tablet Take 25 mg by mouth every evening.   Yes [provider]  bisacodyl (DULCOLAX) 10 MG suppository Place 10 mg rectally as needed for moderate constipation.   Yes [provider]  cholecalciferol  (VITAMIN D ) 1000 units tablet Take 2,000 Units by mouth daily.   Yes [provider]  clonazePAM  (KLONOPIN ) 0.5 MG tablet Take 1 tablet (0.5 mg total) by mouth at bedtime. 07/27/24  Yes Pahwani, Fredia, MD  cloNIDine  (CATAPRES ) 0.1 MG tablet Take 2 tablets (0.2 mg total) by mouth 2 (two) times daily. 07/27/24  Yes Pahwani, Fredia, MD  escitalopram  (LEXAPRO ) 10 MG tablet Take 10 mg by mouth daily.   Yes [provider]  famotidine  (PEPCID ) 40 MG tablet Take 1 tablet (40 mg total) by mouth daily for 15 days. 04/30/21 08/12/24 Yes Tobie Litter, MD  Glucerna [facility]) LIQD Take 237 mLs by mouth daily.   Yes [provider]  hydrALAZINE  (APRESOLINE ) 50 MG tablet Take 1 tablet (50 mg total) by mouth every 8 (eight) hours. Patient taking differently: Take 100 mg by mouth every 8 (eight) hours. 07/28/24 08/27/24 Yes Pahwani, Fredia, MD  ibuprofen  (ADVIL ,MOTRIN ) 200 MG tablet Take 200 mg by mouth daily as needed for fever or mild pain.    Yes [provider]  ipratropium-albuterol  (DUONEB) 0.5-2.5 (3) MG/3ML SOLN Take 3 mLs by nebulization every 6 (six) hours as needed.   Yes [provider]  JANUVIA 100 MG tablet Take 100 mg by mouth daily. 04/01/18  Yes [provider]  Loperamide  HCl (IMODIUM  A-D PO) Take 2 tablets by mouth every hour as needed (diarrhea).   Yes [provider]  magnesium  hydroxide (MILK OF MAGNESIA) 400 MG/5ML suspension Take 30 mLs by mouth daily as needed for mild constipation.   Yes [provider]  mineral oil  enema Place 1 enema rectally once.   Yes [provider]  Naphazoline HCl (CLEAR EYES OP) Place 1 drop into both eyes daily as needed (dry eyes).   Yes [provider]  oseltamivir  (TAMIFLU ) 75 MG capsule Take 75 mg by mouth 2 (two) times daily.   Yes [provider]  pravastatin  (  PRAVACHOL ) 20 MG tablet Take 20 mg by mouth every other day.  02/24/18  Yes [provider]  predniSONE (DELTASONE) 20 MG tablet Take 20 mg by mouth daily with breakfast. 3 days   Yes [provider]  telmisartan (MICARDIS) 80 MG tablet Take 80 mg by mouth daily. 04/17/21  Yes [provider]  ondansetron  (ZOFRAN -ODT) 4 MG disintegrating tablet Take 1 tablet (4 mg total) by mouth every 8 (eight) hours as needed. Patient not taking: Reported on 08/12/2024 11/15/23   Griselda Norris, MD    Allergies as of 08/12/2024 - Review Complete 08/12/2024  Allergen Reaction Noted   Avapro  [irbesartan ] Other (See Comments) 03/08/2012   Calcium channel blockers  04/28/2018   Decongestant [pseudoephedrine] Other (See Comments) 03/08/2012   Norvasc [amlodipine besylate] Itching, Other (See Comments), and Hypertension 12/11/2013   Sulfa antibiotics Other (See Comments) 03/08/2012   Zofran  [ondansetron ] Diarrhea 04/30/2021    INPATIENT MEDICATIONS Current Facility-Administered Medications  Medication Dose Route Frequency Provider Last Rate Last Admin   0.9 %  sodium chloride  infusion (Manually program via Guardrails IV Fluids)   Intravenous Once Guillermina Hamilton, MD       acetaminophen  (TYLENOL ) tablet 650 mg  650 mg Oral Q6H PRN Joseph, Preetha, MD   650 mg at 08/19/24 1235   albuterol  (PROVENTIL ) (2.5 MG/3ML) 0.083% nebulizer solution 2.5 mg  2.5 mg Nebulization Q4H PRN Olalere, Adewale A, MD   2.5 mg at 08/13/24 1118   atenolol  (TENORMIN ) tablet 25 mg  25 mg Oral QPM Lue Elsie BROCKS, MD   25 mg at 08/21/24 1615   clonazePAM  (KLONOPIN ) tablet 0.5 mg  0.5 mg Oral QHS Lue Elsie BROCKS, MD   0.5 mg at 08/21/24 2254   enoxaparin  (LOVENOX ) injection 70 mg  70 mg Subcutaneous Q12H Tanda Powell ORN, RPH   70 mg at 08/22/24 0545   escitalopram  (LEXAPRO ) tablet 10 mg  10 mg Oral Daily Lue Elsie BROCKS, MD   10 mg at 08/21/24 0940   hydrALAZINE  (APRESOLINE ) injection 10 mg  10 mg Intravenous Q4H PRN Lue Elsie BROCKS, MD   10 mg at 08/19/24 9687   hydrALAZINE  (APRESOLINE ) tablet 50 mg  50 mg Oral Q8H Joseph, Preetha, MD   50 mg at 08/22/24 0542   insulin  aspart (novoLOG ) injection 0-5 Units  0-5 Units Subcutaneous QHS Garba, Mohammad L, MD   2 Units at 08/18/24 2036   insulin  aspart (novoLOG ) injection 0-9 Units  0-9 Units Subcutaneous TID WC Sim Emery CROME, MD   2 Units at 08/21/24 1615   irbesartan  (AVAPRO ) tablet 300 mg  300 mg Oral Daily Lue Elsie BROCKS, MD   300 mg at 08/21/24 0940   Oral care mouth rinse  15 mL Mouth Rinse PRN Maree Harder, MD       pantoprazole  (PROTONIX ) EC tablet 40 mg  40 mg Oral Daily Joseph, Preetha, MD   40 mg at 08/21/24 0940   polyethylene glycol (MIRALAX  / GLYCOLAX ) packet 17 g  17 g Oral Daily PRN Joseph, Preetha, MD       promethazine  (PHENERGAN ) tablet 12.5 mg  12.5 mg Oral Q6H PRN Garba, Mohammad L, MD       senna-docusate (Senokot-S) tablet 1 tablet  1 tablet Oral QHS PRN Fairy Frames, MD         Past Medical History:  Diagnosis Date   Anxiety    seen at [facility], for extreme anxiety. As a result of her anxiety  she had ECHO  Cancer (HCC)    basal cell    Complication of anesthesia    atropine  increased BP & increased pulse   Depression    Diabetes mellitus    type 2- 2006, managed with diet   GERD (gastroesophageal reflux disease)    rare occurence   H/O hiatal hernia    Headache(784.0)    caused by sinus congestion fr. time to time    Hypertension     Past Surgical History:  Procedure Laterality Date   ABDOMINAL HYSTERECTOMY     1991-scar later had to be further repaired    APPENDECTOMY     L ovarian  cyst - ruptured & appendectomy done at same time.    EYE SURGERY     R eye- macular hole repair- 2008   GAS INSERTION  03/18/2012   Procedure: INSERTION OF GAS;  Surgeon: Norleen JONETTA Ku, MD;  Location: [facility] OR;  Service: Ophthalmology;  Laterality: Left;   HERNIA REPAIR     1974- inguinal repair   PARS PLANA VITRECTOMY  03/18/2012   Procedure: PARS PLANA VITRECTOMY WITH 25 GAUGE;  Surgeon: Norleen JONETTA Ku, MD;  Location: [facility] OR;  Service: Ophthalmology;  Laterality: Left;  25 GAUGE PPV FOR MACULAR HOLE   TEAR DUCT PROBING     as an infant   TONSILLECTOMY     and adenioidectomy- as a child   VITRECTOMY  03/18/2012    History reviewed. No pertinent family history.  Social History   Socioeconomic History   Marital status: Single    Spouse name: Not on file   Number of children: Not on file   Years of education: Not on file   Highest education level: Not on file  Occupational History   Not on file  Tobacco Use   Smoking status: Never   Smokeless tobacco: Never  Vaping Use   Vaping status: Never Used  Substance and Sexual Activity   Alcohol use: No   Drug use: No   Sexual activity: Not Currently    Birth control/protection: Post-menopausal  Other Topics Concern   Not on file  Social History Narrative   Not on file   Social Drivers of Health   Tobacco Use: Low Risk (08/20/2024)   Patient History    Smoking Tobacco Use: Never    Smokeless Tobacco Use: Never    Passive Exposure: Not on file  Financial Resource Strain: Not on file  Food Insecurity: Patient Unable To Answer (07/23/2024)   Epic    Worried About Programme Researcher, Broadcasting/film/video in the Last Year: Patient unable to answer    Ran Out of Food in the Last Year: Patient unable to answer  Transportation Needs: Patient Unable To Answer (07/23/2024)   Epic    Lack of Transportation (Medical): Patient unable to answer    Lack of Transportation (Non-Medical): Patient unable to answer  Physical Activity: Not on file  Stress: Not on file   Social Connections: Patient Unable To Answer (07/23/2024)   Social Connection and Isolation Panel    Frequency of Communication with Friends and Family: Patient unable to answer    Frequency of Social Gatherings with Friends and Family: Patient unable to answer    Attends Religious Services: Patient unable to answer    Active Member of Clubs or Organizations: Patient unable to answer    Attends Banker Meetings: Patient unable to answer    Marital Status: Patient unable to answer  Intimate Partner Violence: Patient Unable To Answer (  07/23/2024)   Epic    Fear of Current or Ex-Partner: Patient unable to answer    Emotionally Abused: Patient unable to answer    Physically Abused: Patient unable to answer    Sexually Abused: Patient unable to answer  Depression (PHQ2-9): Not on file  Alcohol Screen: Not on file  Housing: Patient Unable To Answer (07/23/2024)   Epic    Unable to Pay for Housing in the Last Year: Patient unable to answer    Number of Times Moved in the Last Year: Not on file    Homeless in the Last Year: Patient unable to answer  Utilities: Patient Unable To Answer (07/23/2024)   Epic    Threatened with loss of utilities: Patient unable to answer  Health Literacy: Not on file    Code Status   Code Status: Limited: Do not attempt resuscitation (DNR) -DNR-LIMITED -Do Not Intubate/DNI    Vina Dasen, NP-C   08/22/2024, 9:56 AM  ------------------------------------------------------  I have taken a history, reviewed the chart and examined the patient. I performed a substantive portion of this encounter, including complete performance of at least one of the key components, in conjunction with the APP. I agree with the APP's note, impression and recommendations  84 year old female with history of diabetes, hypertension, anxiety and depression admitted with respiratory failure from influenza and pneumonia, also found to have profound anemia with hemoglobin 4.5  down from 13 only 2 weeks prior.  No clear history of active GI bleeding per the patient.  She was transfused and her hemoglobin has remained stable since December 27.  She has not had evidence of overt bleeding.  Her respiratory status has improved significantly, and we were asked to reevaluate for possible endoscopic evaluation.  Although the patient had initially agreed to proceed with an upper endoscopy when discussed with NP Dasen, when I came to see the patient later today, the patient had changed her mind and decided that she did not want to proceed with the upper endoscopy.  I had a lengthy conversation with the patient about the risks and benefits of undergoing the upper endoscopy, as well as the risks and benefits of not doing the procedure.  I did recommend that she reconsider, as she is of a fairly good functional status at baseline, and would potentially be at risk for recurrent bleeding.  We do not have any clear idea about what may have bled and I raised the possibility of benign etiologies such as AVMs as well as potential for malignant possibilities such as gastric cancer.  Despite this lengthy conversation, the patient was still fairly certain that she did not want to proceed with the procedure.  I told her that we would leave her on the schedule for now and readdress this with her in the morning in case she changes her mind overnight.  With regards to the suspected bowel wall thickening in the colon, this was not noted on a CT scan earlier in the month (although this was limited by absence of contrast).  She did not have any symptoms of colitis.  Given her reluctance to undergo sedated procedures, would recommend a repeat CT scan in 4-6 weeks to see if there are persistent abnormalities (assuming that the patient would be open to an endoscopic evaluation if the CT abnormalities are persistent).  Will reassess patient in the morning.  If she still declines to proceed with upper  endoscopy, can resume regular diet in the morning  Kimra Kantor E. Stacia, MD  [facility]  Moderate complex medical decision making (this includes chart review, review of results, face-to-face time used for counseling as well as treatment plan and follow-up. The patient was provided an opportunity to ask questions and all were answered. The patient agreed with the plan and demonstrated an understanding of the instructions    "

## 2024-08-22 NOTE — TOC Progression Note (Addendum)
 Transition of Care [facility]) - Progression Note    Patient Details  Name: Monica Faulkner MRN: [id number] Date of Birth: [birth year]  Transition of Care [facility]) CM/SW Contact  Isaiah Public, LCSWA Phone Number: 08/22/2024, 12:45 PM  Clinical Narrative:     CSW following to start insurance authorization closer to being medically ready. CSW awaiting call back from [facility] admissions.   Expected Discharge Plan: Skilled Nursing Facility Barriers to Discharge: Continued Medical Work up, English As A Second Language Teacher               Expected Discharge Plan and Services In-house Referral: Clinical Social Work   Post Acute Care Choice: Skilled Nursing Facility Living arrangements for the past 2 months: Single Family Home, Skilled Nursing Facility                                       Social Drivers of Health (SDOH) Interventions SDOH Screenings   Food Insecurity: Patient Unable To Answer (07/23/2024)  Housing: Patient Unable To Answer (07/23/2024)  Transportation Needs: Patient Unable To Answer (07/23/2024)  Utilities: Patient Unable To Answer (07/23/2024)  Social Connections: Patient Unable To Answer (07/23/2024)  Tobacco Use: Low Risk (08/20/2024)    Readmission Risk Interventions     No data to display

## 2024-08-22 NOTE — Progress Notes (Signed)
 Speech Language Pathology Treatment: Dysphagia  Patient Details Name: Monica Faulkner MRN: [id number] DOB: 20-Jul-1941 Today's Date: 08/22/2024 Time: 9044-8991 SLP Time Calculation (min) (ACUTE ONLY): 13 min  Assessment / Plan / Recommendation Clinical Impression  Pt is now on room air and able to feed herself. Her diet was advanced to regular solids over the weekend. Oral transit is prompt and complete with regular solids. No signs clinically concerning for aspiration were observed with consecutive straw sips of thin liquids. No previous diagnoses of pneumonia noted per chart review. Continue current diet without ongoing SLP f/u.     HPI HPI: Monica Faulkner is an 84 y.o. female who presented to the hospital from SNF on 08/12/24 with diagnosis of flu and PNA. She was recently admitted 12/5-12/15/25 with acute metabolic encephalopathy and severe dehydration with AKI, lactic acidosis as well and sent to SNF upon discharge. For current admission, she developed severe respiratory distress, placed on NRB for SpO2 90%; she was barely arousable; hemoglobin was 4.5 (was 13.6 at discharge two weeks prior). CT angio/chest/PE showed superimposed consolidation within the peripheral right lower lobe may reflect changes of acute infection or aspiration. CXR showed new right basilar airspace opacity; suspicious for PNA. She was advanced from NRB to [facility] on 45L and was admitted with acute metabolic encephalopathy, multifactorial, influenza PNA, acute GI bleed, subacute PE. PMH: GERD, DM-2, HTN, anxiety disorder, depression      SLP Plan  All goals met        Swallow Evaluation Recommendations   Recommendations: PO diet PO Diet Recommendation: Regular;Thin liquids (Level 0) Liquid Administration via: Cup;Straw Medication Administration: Whole meds with puree Supervision: Patient able to self-feed;Full supervision/cueing for swallowing strategies Postural changes: Position pt fully upright for meals Oral  care recommendations: Oral care BID (2x/day)     Recommendations                     Oral care BID   Frequent or constant Supervision/Assistance Dysphagia, unspecified (R13.10)     All goals met     Damien Blumenthal, M.A., CCC-SLP Speech Language Pathology, Acute Rehabilitation Services  Secure Chat preferred [phone]   08/22/2024, 10:37 AM

## 2024-08-22 NOTE — Progress Notes (Addendum)
 " PROGRESS NOTE    Monica Faulkner  FMW:[id number] DOB: 1940-12-04 DOA: 08/12/2024 PCP: Verdia Lombard, MD   Brief Narrative:  Monica Faulkner is a 84 y.o. female with medical history significant of basal cell carcinoma, type 2 diabetes, GERD, essential hypertension, anxiety disorder, depression.  Admitted on 08/12/2024 with diagnosis from rehab with the flu and pneumonia with severe hypoxia and respiratory distress.  Patient initially requiring nonrebreather and heated high flow to maintain sats above 90%.  Patient found to have profound anemia at 4.5 -FOBT positive but dark brown stool noted on exam.  She also has notable subacute PE and age-indeterminate DVT of the left peroneal vein complicating her symptoms. CT also notable for colitis - Admitted, started on Tamiflu  and antibiotics, CT abdomen concerning for colitis - Respiratory status slowly improving - No further bleeding, 1/1 started on IV heparin  -1/3: GI consulted -1/4: lost IV access, changed to Lovenox   Assessment & Plan:  Acute hypoxic respiratory failure Influenza pneumonia Bibasilar pneumonia, rule out bacterial component Subactute PE, age indeterminant DVT, L peroneal vein - Patient's respiratory status is multifactorial in the setting of subacute PE, influenza pneumonia with concern over superimposed bacterial pneumonia as well  - Completed 5 days of Tamiflu , completed 7 days of ABX - Was initially on on 20 L heated high flow, FiO2-45% - Iatrogenic fluid overload also contributing, continue IV Lasix  again today, switch to oral diuretics tomorrow - Improving well, weaned off O2 today - Initially had been unable to anticoagulate for PE in the setting of severe anemia and GI bleed, appears to be no further evidence of active GI bleed at this time, started on IV heparin  1/1, continue this for now, discussed with GI, they will evaluate today  Acute metabolic encephalopathy, multifactorial, POA  -Multifactorial,  likely secondary to above, improving  Severe acute symptomatic anemia Acute GI bleed Acute colitis, ?  Ischemic - Hemoglobin 4.5 on admission FOBT positive - Status post 2 unit PRBC on admission, now hemoglobin is stable - RX w metronidazole  and ceftriaxone  for presumed pneumonia as well, completed 7 days of ABX -No overt bleeding reported anymore - Discussed with GI, they will evaluate Monday for workup  Acute on chronic diastolic CHF -Echo this admission with EF 60-65%, indeterminate diastolic function, normal RV, no significant valvular heart disease -Suspect a fair bit her if her volume overload was iatrogenic -Improved significantly with diuresis, now weaned off O2, switch to oral diuretics, continue ARB, atenolol  -Poor candidate for SGLT2i   Essential hypertension:  - Restart home medications as below including atenolol , hydralazine , irbesartan (substituted for home telmisartan)  AKI, resolved: Likely prerenal, downtrending appropriately with supportive care/transfusion  Severe hypokalemia Replete  Subacute pulmonary embolism:  - See discussion above  GERD: Continue with PPIs    DVT prophylaxis: SCDs Start: 08/12/24 2331 no chemical prophylaxis in the setting of GI bleed Code Status:   Code Status: Limited: Do not attempt resuscitation (DNR) -DNR-LIMITED -Do Not Intubate/DNI   Family Communication: No family at bedside today, called and updated brother [name] again  Status is: Inpatient  Dispo: The patient is from: [facility] SNF               Anticipated d/c is to: SNF              Anticipated d/c date is: To be determined              Patient currently not medically stable for discharge  Consultants:  [facility]  Procedures:  None  Antimicrobials:  Ceftriaxone , metronidazole   Subjective: Feels fair, pulled out IV yesterday, denies any bleeding, breathing is better  Objective: Vitals:   08/21/24 2042 08/21/24 2316 08/22/24 0320 08/22/24 0753  BP: (!)  136/55 (!) 144/65 (!) 148/61 (!) 148/61  Pulse: 71 73 70 91  Resp: 19 19 (!) 22 20  Temp: 97.6 F (36.4 C) 97.6 F (36.4 C) 98.1 F (36.7 C) 97.6 F (36.4 C)  TempSrc: Oral Oral Oral Axillary  SpO2: 92% 100% 97% 91%  Weight:      Height:        Intake/Output Summary (Last 24 hours) at 08/22/2024 1015 Last data filed at 08/22/2024 0600 Gross per 24 hour  Intake 720 ml  Output 1000 ml  Net -280 ml   Filed Weights   08/12/24 1559 08/14/24 0400 08/17/24 2000  Weight: 60.5 kg 71.6 kg 67.6 kg    Examination:  General: Elderly, chronically ill female, sitting up in bed, AAO x 2, mild cognitive deficits, no distress HEENT: no JVD CVS: S1S2/RRR Lungs: Improved air movement, decreased breath sounds at the bases Abdomen: Soft, nontender, bowel sounds present Extremities: Trace edema   Data Reviewed: I have personally reviewed following labs and imaging studies  CBC: Recent Labs  Lab 08/18/24 0307 08/19/24 0254 08/20/24 0535 08/21/24 0400 08/22/24 0510  WBC 4.7 4.2 6.3 6.1 5.9  HGB 8.2* 8.4* 8.7* 8.7* 8.9*  HCT 24.5* 25.8* 26.6* 26.9* 27.3*  MCV 91.4 91.8 93.3 93.4 92.9  PLT 279 314 395 381 384   Basic Metabolic Panel: Recent Labs  Lab 08/17/24 1136 08/18/24 0307 08/19/24 0254 08/20/24 0949 08/21/24 0400 08/22/24 0510  NA 130* 132* 135 133* 132* 133*  K 3.7 3.5 4.0 3.8 3.6 3.5  CL 95* 96* 98 96* 94* 95*  CO2 27 28 31 29 31 27   GLUCOSE 165* 124* 103* 193* 139* 138*  BUN 12 11 9 8 8 12   CREATININE 0.73 0.69 0.70 0.79 0.84 0.92  CALCIUM 7.8* 8.2* 8.1* 8.7* 8.9 9.2  MG 1.5*  --   --   --   --   --    GFR: Estimated Creatinine Clearance: 41.7 mL/min (by C-G formula based on SCr of 0.92 mg/dL).  Liver Function Tests: No results for input(s): AST, ALT, ALKPHOS, BILITOT, PROT, ALBUMIN in the last 168 hours.  BNP (last 3 results) Recent Labs    08/12/24 1618  PROBNP 5,942.0*   CBG: Recent Labs  Lab 08/21/24 0747 08/21/24 1143 08/21/24 1604  08/21/24 2047 08/22/24 0759  GLUCAP 138* 125* 161* 143* 179*   Thyroid  Function Tests: No results for input(s): TSH, T4TOTAL, FREET4, T3FREE, THYROIDAB in the last 72 hours.  Sepsis Labs: No results for input(s): PROCALCITON, LATICACIDVEN in the last 168 hours.  Recent Results (from the past 240 hours)  Blood culture (routine x 2)     Status: None   Collection Time: 08/12/24  4:09 PM   Specimen: BLOOD  Result Value Ref Range Status   Specimen Description BLOOD SITE NOT SPECIFIED  Final   Special Requests   Final    BOTTLES DRAWN AEROBIC AND ANAEROBIC Blood Culture adequate volume   Culture   Final    NO GROWTH 5 DAYS Performed at [facility], [address], [place], [place] [address]    Report Status 08/17/2024 FINAL  Final  Blood culture (routine x 2)     Status: None   Collection Time: 08/12/24  4:14 PM  Specimen: BLOOD LEFT FOREARM  Result Value Ref Range Status   Specimen Description BLOOD LEFT FOREARM  Final   Special Requests   Final    BOTTLES DRAWN AEROBIC AND ANAEROBIC Blood Culture adequate volume   Culture   Final    NO GROWTH 5 DAYS Performed at [facility], [address], [place], [place] [address]    Report Status 08/17/2024 FINAL  Final  Resp panel by RT-PCR (RSV, Flu A&B, Covid) Anterior Nasal Swab     Status: Abnormal   Collection Time: 08/12/24  4:42 PM   Specimen: Anterior Nasal Swab  Result Value Ref Range Status   SARS Coronavirus 2 by RT PCR NEGATIVE NEGATIVE Final   Influenza A by PCR POSITIVE (A) NEGATIVE Final   Influenza B by PCR NEGATIVE NEGATIVE Final    Comment: (NOTE) The Xpert Xpress SARS-CoV-2/FLU/RSV plus assay is intended as an aid in the diagnosis of influenza from Nasopharyngeal swab specimens and should not be used as a sole basis for treatment. Nasal washings and aspirates are unacceptable for Xpert Xpress SARS-CoV-2/FLU/RSV testing.  Fact Sheet for  Patients: bloggercourse.com  Fact Sheet for Healthcare Providers: seriousbroker.it  This test is not yet approved or cleared by the [place]  FDA and has been authorized for detection and/or diagnosis of SARS-CoV-2 by FDA under an Emergency Use Authorization (EUA). This EUA will remain in effect (meaning this test can be used) for the duration of the COVID-19 declaration under Section 564(b)(1) of the Act, 21 U.S.C. section 360bbb-3(b)(1), unless the authorization is terminated or revoked.     Resp Syncytial Virus by PCR NEGATIVE NEGATIVE Final    Comment: (NOTE) Fact Sheet for Patients: bloggercourse.com  Fact Sheet for Healthcare Providers: seriousbroker.it  This test is not yet approved or cleared by the [place]  FDA and has been authorized for detection and/or diagnosis of SARS-CoV-2 by FDA under an Emergency Use Authorization (EUA). This EUA will remain in effect (meaning this test can be used) for the duration of the COVID-19 declaration under Section 564(b)(1) of the Act, 21 U.S.C. section 360bbb-3(b)(1), unless the authorization is terminated or revoked.  Performed at [facility], [address], [place], [place] [address]   Urine Culture     Status: None   Collection Time: 08/12/24  8:34 PM   Specimen: Urine, Catheterized  Result Value Ref Range Status   Specimen Description URINE, CATHETERIZED  Final   Special Requests NONE  Final   Culture   Final    NO GROWTH Performed at [facility], [address], [place], [place] [address]    Report Status 08/14/2024 FINAL  Final  MRSA Next Gen by PCR, Nasal     Status: None   Collection Time: 08/12/24 11:21 PM   Specimen: Nasal Mucosa; Nasal Swab  Result Value Ref Range Status   MRSA by PCR Next Gen NOT DETECTED NOT DETECTED Final    Comment: (NOTE) The GeneXpert MRSA Assay (FDA approved for NASAL  specimens only), is one component of a comprehensive MRSA colonization surveillance program. It is not intended to diagnose MRSA infection nor to guide or monitor treatment for MRSA infections. Test performance is not FDA approved in patients less than 37 years old. Performed at [facility], [address], [place], [place] [address]     Radiology Studies: No results found.    Scheduled Meds:  sodium chloride    Intravenous Once   atenolol   25 mg Oral  QPM   clonazePAM   0.5 mg Oral QHS   enoxaparin  (LOVENOX ) injection  70 mg Subcutaneous Q12H   escitalopram   10 mg Oral Daily   hydrALAZINE   50 mg Oral Q8H   insulin  aspart  0-5 Units Subcutaneous QHS   insulin  aspart  0-9 Units Subcutaneous TID WC   irbesartan   300 mg Oral Daily   pantoprazole   40 mg Oral Daily   Continuous Infusions:     LOS: 10 days   Time spent:   Sigurd Pac, MD [facility]  If 7PM-7AM, please contact night-coverage [email or web address]  08/22/2024, 10:15 AM      "

## 2024-08-22 NOTE — Care Management Important Message (Signed)
 Important Message  Patient Details  Name: Monica Faulkner MRN: [id number] Date of Birth: [birth year]   Important Message Given:  Yes - Medicare IM     Monica Faulkner 08/22/2024, 10:47 AM

## 2024-08-22 NOTE — Plan of Care (Signed)

## 2024-08-23 ENCOUNTER — Encounter (HOSPITAL_COMMUNITY): Payer: Self-pay | Admitting: Registered Nurse

## 2024-08-23 ENCOUNTER — Telehealth (HOSPITAL_COMMUNITY): Payer: Self-pay

## 2024-08-23 ENCOUNTER — Encounter (HOSPITAL_COMMUNITY)
Admission: EM | Disposition: A | Discharge status: Skilled Nursing Facility | Payer: Self-pay | Source: Skilled Nursing Facility | Attending: Internal Medicine

## 2024-08-23 ENCOUNTER — Other Ambulatory Visit (HOSPITAL_COMMUNITY): Payer: Self-pay

## 2024-08-23 DIAGNOSIS — J9621 Acute and chronic respiratory failure with hypoxia: Secondary | ICD-10-CM | POA: Diagnosis not present

## 2024-08-23 DIAGNOSIS — J09X1 Influenza due to identified novel influenza A virus with pneumonia: Secondary | ICD-10-CM | POA: Diagnosis not present

## 2024-08-23 DIAGNOSIS — K922 Gastrointestinal hemorrhage, unspecified: Secondary | ICD-10-CM

## 2024-08-23 DIAGNOSIS — D62 Acute posthemorrhagic anemia: Secondary | ICD-10-CM | POA: Diagnosis not present

## 2024-08-23 LAB — CBC
HCT: 25.6 % — ABNORMAL LOW (ref 36.0–46.0)
Hemoglobin: 8.5 g/dL — ABNORMAL LOW (ref 12.0–15.0)
MCH: 30.8 pg (ref 26.0–34.0)
MCHC: 33.2 g/dL (ref 30.0–36.0)
MCV: 92.8 fL (ref 80.0–100.0)
Platelets: 355 K/uL (ref 150–400)
RBC: 2.76 MIL/uL — ABNORMAL LOW (ref 3.87–5.11)
RDW: 16.6 % — ABNORMAL HIGH (ref 11.5–15.5)
WBC: 7.5 K/uL (ref 4.0–10.5)
nRBC: 0 % (ref 0.0–0.2)

## 2024-08-23 LAB — BASIC METABOLIC PANEL WITH GFR
Anion gap: 8 (ref 5–15)
BUN: 15 mg/dL (ref 8–23)
CO2: 28 mmol/L (ref 22–32)
Calcium: 9.1 mg/dL (ref 8.9–10.3)
Chloride: 98 mmol/L (ref 98–111)
Creatinine, Ser: 0.9 mg/dL (ref 0.44–1.00)
GFR, Estimated: >60 mL/min
Glucose, Bld: 145 mg/dL — ABNORMAL HIGH (ref 70–99)
Potassium: 3.6 mmol/L (ref 3.5–5.1)
Sodium: 135 mmol/L (ref 135–145)

## 2024-08-23 LAB — GLUCOSE, CAPILLARY
Glucose-Capillary: 146 mg/dL — ABNORMAL HIGH (ref 70–99)
Glucose-Capillary: 199 mg/dL — ABNORMAL HIGH (ref 70–99)
Glucose-Capillary: 175 mg/dL — ABNORMAL HIGH (ref 70–99)
Glucose-Capillary: 189 mg/dL — ABNORMAL HIGH (ref 70–99)

## 2024-08-23 SURGERY — EGD (ESOPHAGOGASTRODUODENOSCOPY)
Anesthesia: Monitor Anesthesia Care

## 2024-08-23 MED ORDER — APIXABAN 5 MG PO TABS
5.0000 mg | ORAL_TABLET | Freq: Two times a day (BID) | ORAL | Status: DC
  Administered 2024-08-23 – 2024-08-25 (×5): 5 mg via ORAL
  Filled 2024-08-23 (×5): qty 1

## 2024-08-23 NOTE — Progress Notes (Signed)
 Patient had hydralazine  due at HS, BP 110s/40s this pm.  Patient is asymptomatic.  Dr. Sundil notified with orders to hold pm hydralazine .

## 2024-08-23 NOTE — TOC Progression Note (Addendum)
 Transition of Care [facility]) - Progression Note    Patient Details  Name: Monica Faulkner MRN: [id number] Date of Birth: 1941/04/04  [facility] CM/SW Contact  Isaiah Public, LCSWA Phone Number: 08/23/2024, 12:50 PM  Clinical Narrative:     CSW spoke with patient who confirmed plan is to dc to [facility] when medically ready. CSW informed patient CSW plans to start insurance authorization for SNF today. Patient confirmed of main point of contact is her brother [name]. All questions answered. No further questions reported at this time.   Pt to see patient today. Once current PT note is in [place] CMA confirmed she plans on starting insurance authorization for patient. CSW will continue to follow.  Update- PT note in . CSW requested for Jon CMA to start insurance authorization for SNF for patient.  UpdateGLENWOOD Jon CMA informed CSW that patients insurance authorization currently pending [name] ID# [id number] .  Expected Discharge Plan: Skilled Nursing Facility Barriers to Discharge: Continued Medical Work up, English As A Second Language Teacher               Expected Discharge Plan and Services In-house Referral: Clinical Social Work   Post Acute Care Choice: Skilled Nursing Facility Living arrangements for the past 2 months: Single Family Home, Skilled Nursing Facility                                       Social Drivers of Health (SDOH) Interventions SDOH Screenings   Food Insecurity: Patient Unable To Answer (07/23/2024)  Housing: Patient Unable To Answer (07/23/2024)  Transportation Needs: Patient Unable To Answer (07/23/2024)  Utilities: Patient Unable To Answer (07/23/2024)  Social Connections: Patient Unable To Answer (07/23/2024)  Tobacco Use: Low Risk (08/20/2024)    Readmission Risk Interventions     No data to display

## 2024-08-23 NOTE — Progress Notes (Signed)
 Physical Therapy Treatment Patient Details Name: Monica Faulkner MRN: [id number] DOB: 02-01-1941 Today's Date: 08/23/2024   History of Present Illness Pt is an 84 y/o F presenting to [facility] on 08/12/24 from rehab facility due to flue/pneumonia with profound hypoxia and respiratory distress.Pt found to be anemic as well. Subacute PE and age indeterminate DVT of the L peroneal vein found as well. CT positive for colitis which MD believes is most likely reason for anemia. Recently discharged on 12/15 in the setting of AKI, lactic acidosis and dehydration with metabolic encephalopathy.   PMHx: HTN, DM, anxiety, basal cell carcinoma, GERD, anxiety, depression    PT Comments  Pt with very limited mobility today, lethargic, flat affect, had back pain in sitting and standing as well as quick fatigue. Pt needed mod A +2 to come to sitting and stand EOB. Attempted to initiate gait but pt only able to step feet 2x before needing to sit due to fatigue. Pt tolerated seated LE there ex EOB and postural exercises. HR up to 110 bpm with activity. SPO2 99% on RA. Patient will benefit from continued inpatient follow up therapy, <3 hours/day. PT will continue to follow.     If plan is discharge home, recommend the following: A lot of help with bathing/dressing/bathroom;Assistance with cooking/housework;Direct supervision/assist for financial management;Direct supervision/assist for medications management;Assist for transportation;Help with stairs or ramp for entrance;Supervision due to cognitive status;A lot of help with walking and/or transfers   Can travel by private vehicle     No  Equipment Recommendations  None recommended by PT    Recommendations for Other Services       Precautions / Restrictions Precautions Precautions: Fall Recall of Precautions/Restrictions: Impaired Precaution/Restrictions Comments: monitor BP, HR Restrictions Weight Bearing Restrictions Per Provider Order: No     Mobility  Bed  Mobility Overal bed mobility: Needs Assistance Bed Mobility: Supine to Sit, Sit to Supine     Supine to sit: Mod assist, +2 for physical assistance, +2 for safety/equipment Sit to supine: Mod assist, +2 for physical assistance   General bed mobility comments: cues for rolling and mod A for LE's off bed as well as elevation of trunk to sitting. Pt able to initate lifting of LE's to return to supine but needed mod A to fully accomplish task    Transfers Overall transfer level: Needs assistance Equipment used: Rolling walker (2 wheels) Transfers: Sit to/from Stand Sit to Stand: Mod assist, +2 physical assistance, +2 safety/equipment           General transfer comment: pt needed mod A for power up and was fearful of falling. Could not come to erect position.    Ambulation/Gait Ambulation/Gait assistance: Min assist, +2 safety/equipment Gait Distance (Feet): 1 Feet Assistive device: Rolling walker (2 wheels) Gait Pattern/deviations: Trunk flexed, Step-to pattern Gait velocity: decreased Gait velocity interpretation: <1.31 ft/sec, indicative of household ambulator   General Gait Details: attempted sidesteps to L along bedside but pt could take only 2 before fatiguing and needing to sit   Stairs             Wheelchair Mobility     Tilt Bed    Modified Rankin (Stroke Patients Only)       Balance Overall balance assessment: Needs assistance Sitting-balance support: Single extremity supported, Feet supported Sitting balance-Leahy Scale: Poor Sitting balance - Comments: kyphotic posture, unable to accept chalenge. able to move LE's and maintain balance   Standing balance support: Bilateral upper extremity supported, During functional activity, Reliant  on assistive device for balance Standing balance-Leahy Scale: Poor Standing balance comment: heavily reliant on UE support and external assist                            Communication  Communication Communication: No apparent difficulties  Cognition Arousal: Lethargic Behavior During Therapy: Flat affect   PT - Cognitive impairments: Initiation, Problem solving, Safety/Judgement                       PT - Cognition Comments: pt closes eyes frequently during session. When first asked how old she just turned she started to say 37 but was then able to correct to 31. Did not want to mobilize Following commands: Impaired Following commands impaired: Follows one step commands with increased time, Follows multi-step commands inconsistently    Cueing Cueing Techniques: Verbal cues, Gestural cues  Exercises General Exercises - Lower Extremity Ankle Circles/Pumps: AROM, Both, 10 reps, Seated Long Arc Quad: AROM, Both, 10 reps, Seated Hip Flexion/Marching: AROM, Both, 10 reps, Seated    General Comments General comments (skin integrity, edema, etc.): SPO2 99% on RA. HR up to 110 bpm. Pt maintains kyphotic position in sitting and standing but was able to tolerate bed flat with one pillow under head before mobilizing.      Pertinent Vitals/Pain Pain Assessment Pain Assessment: Faces Faces Pain Scale: Hurts little more Pain Location: back pain Pain Descriptors / Indicators: Discomfort, Sore, Aching Pain Intervention(s): Limited activity within patient's tolerance, Monitored during session    Home Living                          Prior Function            PT Goals (current goals can now be found in the care plan section) Acute Rehab PT Goals Patient Stated Goal: to leave the hospital PT Goal Formulation: With patient Time For Goal Achievement: 08/31/24 Potential to Achieve Goals: Fair Progress towards PT goals: Not progressing toward goals - comment (lethargy, pain)    Frequency    Min 1X/week      PT Plan      Co-evaluation              AM-PAC PT 6 Clicks Mobility   Outcome Measure  Help needed turning from your back to your  side while in a flat bed without using bedrails?: A Lot Help needed moving from lying on your back to sitting on the side of a flat bed without using bedrails?: A Lot Help needed moving to and from a bed to a chair (including a wheelchair)?: A Lot Help needed standing up from a chair using your arms (e.g., wheelchair or bedside chair)?: A Lot Help needed to walk in hospital room?: Total Help needed climbing 3-5 steps with a railing? : Total 6 Click Score: 10    End of Session Equipment Utilized During Treatment: Gait belt Activity Tolerance: Patient limited by fatigue;Patient limited by lethargy;Patient limited by pain Patient left: with call bell/phone within reach;in bed;with bed alarm set Nurse Communication: Mobility status PT Visit Diagnosis: Other abnormalities of gait and mobility (R26.89)     Time: 8558-8495 PT Time Calculation (min) (ACUTE ONLY): 23 min  Charges:    $Therapeutic Exercise: 8-22 mins $Therapeutic Activity: 8-22 mins PT General Charges $$ ACUTE PT VISIT: 1 Visit  Richerd Lipoma, PT  Acute Rehab Services Secure chat preferred Office [phone]    Richerd CROME Rayana Geurin 08/23/2024, 3:20 PM

## 2024-08-23 NOTE — Progress Notes (Signed)
 " PROGRESS NOTE    Monica Faulkner  FMW:[id number] DOB: Jun 28, 1941 DOA: 08/12/2024 PCP: Verdia Lombard, MD   Brief Narrative:  Monica Faulkner is a 84 y.o. female with medical history significant of basal cell carcinoma, type 2 diabetes, GERD, essential hypertension, anxiety disorder, depression.  Admitted on 08/12/2024 with diagnosis from rehab with the flu and pneumonia with severe hypoxia and respiratory distress.  Patient initially requiring nonrebreather and heated high flow to maintain sats above 90%.  Patient found to have profound anemia at 4.5 -FOBT positive but dark brown stool noted on exam.  She also has notable subacute PE and age-indeterminate DVT of the left peroneal vein complicating her symptoms. CT also notable for colitis - Admitted, started on Tamiflu  and antibiotics, CT abdomen concerning for colitis - Respiratory status slowly improving - No further bleeding, 1/1 started on IV heparin  -1/3: Discussed with GI -1/4: lost IV access, changed to Lovenox  - 1/5: GI consult scheduled for EGD 1/6  Subjective: Refused endoscopy this morning  Assessment & Plan:  Acute hypoxic respiratory failure Influenza pneumonia Healthcare associated pneumonia Subactute PE, age indeterminant DVT, L peroneal vein - Patient's respiratory status is multifactorial in the setting of subacute PE, influenza pneumonia with concern over superimposed bacterial pneumonia as well  - Completed 5 days of Tamiflu , completed 7 days of ABX - Was initially on on 30-40 L heated high flow, FiO2-45% - Iatrogenic fluid overload also contributing, - Improving, weaned off O2 - Initially had been unable to anticoagulate for PE in the setting of severe anemia and GI bleed, appears to be no further evidence of active GI bleed at this time, started on IV heparin  1/1, no further bleeding, today refuses endoscopy will switch to Eliquis   Acute metabolic encephalopathy, multifactorial, POA  -Multifactorial,  likely secondary to above, improving  Severe acute symptomatic anemia Acute GI bleed Acute colitis, ?  Ischemic - Hemoglobin 4.5 on admission FOBT positive, hemoglobin improved to 8.5 after 2 units, likely 4.5 was in error but still significant drop from 12-13 few weeks prior - Status post 2 unit PRBC on admission, now hemoglobin is stable - RX w metronidazole  and ceftriaxone  for presumed pneumonia as well, completed 7 days of ABX -No overt bleeding reported anymore - GI consulted, scheduled for EGD today, this morning refuses procedure, conservative management, GI recommended CT in few weeks  Acute on chronic diastolic CHF -Echo this admission with EF 60-65%, indeterminate diastolic function, normal RV, no significant valvular heart disease -Suspect a fair bit her if her volume overload was iatrogenic -Improved significantly with diuresis, now weaned off O2, switch to oral diuretics, continue ARB, atenolol  -Poor candidate for SGLT2i   Essential hypertension:  - Restart home medications as below including atenolol , hydralazine , irbesartan (substituted for home telmisartan)  AKI, resolved: Likely prerenal, downtrending appropriately with supportive care/transfusion  Severe hypokalemia Replete  Subacute pulmonary embolism:  - See discussion above  GERD: Continue with PPIs    DVT prophylaxis: Start Eliquis  Code Status: DNR Family Communication: No family at bedside today, called and updated brother [name] yesterday Disposition: SNF soon  Consultants:  [facility]   Procedures:  None  Antimicrobials:  Ceftriaxone , metronidazole   Objective: Vitals:   08/22/24 2316 08/23/24 0342 08/23/24 0551 08/23/24 0829  BP: (!) 131/50 (!) 157/53 (!) 150/69 (!) 146/48  Pulse: 74 69  84  Resp: 20 (!) 24  20  Temp: 97.8 F (36.6 C) (!) 97.4 F (36.3 C)  (!) 97.5 F (36.4 C)  TempSrc:  Oral Oral  Oral  SpO2: 97% 96%  91%  Weight:  61.1 kg    Height:        Intake/Output Summary  (Last 24 hours) at 08/23/2024 1116 Last data filed at 08/23/2024 0500 Gross per 24 hour  Intake 360 ml  Output 1200 ml  Net -840 ml   Filed Weights   08/14/24 0400 08/17/24 2000 08/23/24 0342  Weight: 71.6 kg 67.6 kg 61.1 kg    Examination:  General: Elderly, chronically ill female, sitting up in bed, AAO x 2, mild cognitive deficits, no distress HEENT: no JVD CVS: S1S2/RRR Lungs: Improving air movement, clear Abdomen: Soft, nontender, bowel sounds present Extremities: Trace edema   Data Reviewed: I have personally reviewed following labs and imaging studies  CBC: Recent Labs  Lab 08/19/24 0254 08/20/24 0535 08/21/24 0400 08/22/24 0510 08/23/24 0428  WBC 4.2 6.3 6.1 5.9 7.5  HGB 8.4* 8.7* 8.7* 8.9* 8.5*  HCT 25.8* 26.6* 26.9* 27.3* 25.6*  MCV 91.8 93.3 93.4 92.9 92.8  PLT 314 395 381 384 355   Basic Metabolic Panel: Recent Labs  Lab 08/17/24 1136 08/18/24 0307 08/19/24 0254 08/20/24 0949 08/21/24 0400 08/22/24 0510 08/23/24 0428  NA 130*   < > 135 133* 132* 133* 135  K 3.7   < > 4.0 3.8 3.6 3.5 3.6  CL 95*   < > 98 96* 94* 95* 98  CO2 27   < > 31 29 31 27 28   GLUCOSE 165*   < > 103* 193* 139* 138* 145*  BUN 12   < > 9 8 8 12 15   CREATININE 0.73   < > 0.70 0.79 0.84 0.92 0.90  CALCIUM 7.8*   < > 8.1* 8.7* 8.9 9.2 9.1  MG 1.5*  --   --   --   --   --   --    < > = values in this interval not displayed.   GFR: Estimated Creatinine Clearance: 42.6 mL/min (by C-G formula based on SCr of 0.9 mg/dL).  Liver Function Tests: No results for input(s): AST, ALT, ALKPHOS, BILITOT, PROT, ALBUMIN in the last 168 hours.  BNP (last 3 results) Recent Labs    08/12/24 1618  PROBNP 5,942.0*   CBG: Recent Labs  Lab 08/22/24 0759 08/22/24 1248 08/22/24 1648 08/22/24 2028 08/23/24 0829  GLUCAP 179* 228* 193* 185* 146*   Thyroid  Function Tests: No results for input(s): TSH, T4TOTAL, FREET4, T3FREE, THYROIDAB in the last 72 hours.  Sepsis  Labs: No results for input(s): PROCALCITON, LATICACIDVEN in the last 168 hours.  No results found for this or any previous visit (from the past 240 hours).   Radiology Studies: No results found.    Scheduled Meds:  sodium chloride    Intravenous Once   apixaban   5 mg Oral BID   atenolol   25 mg Oral QPM   clonazePAM   0.5 mg Oral QHS   escitalopram   10 mg Oral Daily   furosemide   40 mg Oral Daily   hydrALAZINE   25 mg Oral Q8H   insulin  aspart  0-5 Units Subcutaneous QHS   insulin  aspart  0-9 Units Subcutaneous TID WC   irbesartan   300 mg Oral Daily   pantoprazole   40 mg Oral Daily   Continuous Infusions:     LOS: 11 days   Time spent:   Sigurd Pac, MD [facility]  If 7PM-7AM, please contact night-coverage [email or web address]  08/23/2024, 11:16 AM      "

## 2024-08-23 NOTE — Progress Notes (Signed)
 Patient declined to proceed with upper endoscopy this morning.    Would recommend a repeat CT abd/pelvis with contrast in 4-6 weeks to re-evaluate the colon wall thickening.   This can arranged through her PCP.   Ok to resume anticoagulation if needed for PE.  GI will sign off at this time.  Please reconsult if patient changes her mind.

## 2024-08-23 NOTE — Telephone Encounter (Signed)
 Pharmacy Patient Advocate Encounter  Insurance verification completed.    The patient is insured through [employer]. Patient has Medicare and is not eligible for a copay card, but may be able to apply for patient assistance or Medicare RX Payment Plan (Patient Must reach out to their plan, if eligible for payment plan), if available.    Ran test claim for Eliquis  5mg  tablet and the current 30 day co-pay is $40.   This test claim was processed through [facility]- copay amounts may vary at other pharmacies due to [employer], or as the patient moves through the different stages of their insurance plan.

## 2024-08-23 NOTE — Plan of Care (Signed)

## 2024-08-24 ENCOUNTER — Inpatient Hospital Stay (HOSPITAL_COMMUNITY)

## 2024-08-24 DIAGNOSIS — G9340 Encephalopathy, unspecified: Secondary | ICD-10-CM

## 2024-08-24 DIAGNOSIS — Z66 Do not resuscitate: Secondary | ICD-10-CM

## 2024-08-24 DIAGNOSIS — Z515 Encounter for palliative care: Secondary | ICD-10-CM

## 2024-08-24 DIAGNOSIS — R6520 Severe sepsis without septic shock: Secondary | ICD-10-CM

## 2024-08-24 DIAGNOSIS — J9610 Chronic respiratory failure, unspecified whether with hypoxia or hypercapnia: Secondary | ICD-10-CM

## 2024-08-24 DIAGNOSIS — E871 Hypo-osmolality and hyponatremia: Secondary | ICD-10-CM

## 2024-08-24 LAB — CBC
HCT: 25.3 % — ABNORMAL LOW (ref 36.0–46.0)
Hemoglobin: 8.2 g/dL — ABNORMAL LOW (ref 12.0–15.0)
MCH: 30.4 pg (ref 26.0–34.0)
MCHC: 32.4 g/dL (ref 30.0–36.0)
MCV: 93.7 fL (ref 80.0–100.0)
Platelets: 325 K/uL (ref 150–400)
RBC: 2.7 MIL/uL — ABNORMAL LOW (ref 3.87–5.11)
RDW: 16.8 % — ABNORMAL HIGH (ref 11.5–15.5)
WBC: 6.9 K/uL (ref 4.0–10.5)
nRBC: 0 % (ref 0.0–0.2)

## 2024-08-24 LAB — GLUCOSE, CAPILLARY
Glucose-Capillary: 187 mg/dL — ABNORMAL HIGH (ref 70–99)
Glucose-Capillary: 217 mg/dL — ABNORMAL HIGH (ref 70–99)
Glucose-Capillary: 165 mg/dL — ABNORMAL HIGH (ref 70–99)
Glucose-Capillary: 188 mg/dL — ABNORMAL HIGH (ref 70–99)

## 2024-08-24 LAB — BASIC METABOLIC PANEL WITH GFR
Anion gap: 10 (ref 5–15)
BUN: 14 mg/dL (ref 8–23)
CO2: 26 mmol/L (ref 22–32)
Calcium: 9.1 mg/dL (ref 8.9–10.3)
Chloride: 93 mmol/L — ABNORMAL LOW (ref 98–111)
Creatinine, Ser: 0.87 mg/dL (ref 0.44–1.00)
GFR, Estimated: >60 mL/min
Glucose, Bld: 180 mg/dL — ABNORMAL HIGH (ref 70–99)
Potassium: 3.5 mmol/L (ref 3.5–5.1)
Sodium: 129 mmol/L — ABNORMAL LOW (ref 135–145)

## 2024-08-24 LAB — MAGNESIUM: Magnesium: 1.3 mg/dL — ABNORMAL LOW (ref 1.7–2.4)

## 2024-08-24 MED ORDER — PANTOPRAZOLE SODIUM 40 MG PO TBEC: 40.0000 mg | DELAYED_RELEASE_TABLET | Freq: Every day | ORAL | Status: AC

## 2024-08-24 MED ORDER — FUROSEMIDE 20 MG PO TABS: 20.0000 mg | ORAL_TABLET | Freq: Every day | ORAL | Status: AC

## 2024-08-24 MED ORDER — HYDRALAZINE HCL 25 MG PO TABS: 25.0000 mg | ORAL_TABLET | Freq: Three times a day (TID) | ORAL | Status: AC

## 2024-08-24 MED ORDER — MAGNESIUM SULFATE 4 GM/100ML IV SOLN
4.0000 g | Freq: Once | INTRAVENOUS | Status: AC
  Administered 2024-08-24: 4 g via INTRAVENOUS
  Filled 2024-08-24: qty 100

## 2024-08-24 MED ORDER — APIXABAN 5 MG PO TABS: 5.0000 mg | ORAL_TABLET | Freq: Two times a day (BID) | ORAL | Status: AC

## 2024-08-24 MED ORDER — POLYETHYLENE GLYCOL 3350 17 G PO PACK: 17.0000 g | PACK | Freq: Every day | ORAL | Status: AC | PRN

## 2024-08-24 MED ORDER — ATENOLOL 25 MG PO TABS
12.5000 mg | ORAL_TABLET | Freq: Once | ORAL | Status: AC
  Administered 2024-08-24: 12.5 mg via ORAL
  Filled 2024-08-24: qty 1

## 2024-08-24 NOTE — Plan of Care (Signed)

## 2024-08-24 NOTE — Discharge Summary (Addendum)
 Physician Discharge Summary  Monica Faulkner FMW:[id number] DOB: 01-31-1941 DOA: 08/12/2024  PCP: Verdia Lombard, MD  Admit date: 08/12/2024 Discharge date: 08/25/2024  Time spent: 45 minutes  Recommendations for Outpatient Follow-up:  PCP in 1 week Consider follow-up CT abdomen pelvis with contrast in 4 to 6 weeks to reevaluate colon wall thickening Outpatient palliative care   Discharge Diagnoses:  Principal Problem:   Acute on chronic hypoxic respiratory failure (HCC) Active Problems:   Hyponatremia   Acute metabolic encephalopathy   Controlled type 2 diabetes mellitus without complication, without long-term current use of insulin  (HCC)   Essential hypertension   GERD (gastroesophageal reflux disease)   GI bleed   Symptomatic anemia   Acute colitis   Acute pulmonary embolism (HCC)   Influenza A with pneumonia   Acute blood loss anemia   Discharge Condition: Improved  Diet recommendation: Heart healthy  Filed Weights   08/14/24 0400 08/17/24 2000 08/23/24 0342  Weight: 71.6 kg 67.6 kg 61.1 kg    History of present illness:  Monica Faulkner is a 84 y.o. female with medical history significant of basal cell carcinoma, type 2 diabetes, GERD, essential hypertension, anxiety disorder, depression.  Admitted on 08/12/2024 with diagnosis from rehab with the flu and pneumonia with severe hypoxia and respiratory distress.  Patient initially requiring nonrebreather and heated high flow to maintain sats above 90%.  Patient found to have profound anemia at 4.5 -FOBT positive but dark brown stool noted on exam.  She also has notable subacute PE and age-indeterminate DVT of the left peroneal vein complicating her symptoms. CT also notable for colitis - Admitted, started on Tamiflu  and antibiotics, CT abdomen concerning for colitis - Respiratory status slowly improving - No further bleeding, 1/1 started on IV heparin  -1/3: Discussed with GI -1/4: lost IV access, changed to  Lovenox  - 1/5: GI consult scheduled for EGD 1/6 - 1/6, patient refused EGD, GI signed off  Hospital Course:   Acute hypoxic respiratory failure Influenza pneumonia Healthcare associated pneumonia Subactute PE, age indeterminant DVT, L peroneal vein - Patient's respiratory failure was multifactorial in the setting of subacute PE, influenza pneumonia with concern over superimposed bacterial pneumonia as well  - Completed 5 days of Tamiflu , completed 7 days of ABX - Was initially on on 30-40 L heated high flow, FiO2-45% - Iatrogenic fluid overload also contributing, - Improving, weaned off O2 - Initially had been unable to anticoagulate for PE in the setting of severe anemia and GI bleed, appears to be no further evidence of active GI bleed at this time, started on IV heparin  1/1, no further bleeding, now switched to Eliquis    Acute metabolic encephalopathy, multifactorial, POA  -Multifactorial, likely secondary to above, improving   Severe acute symptomatic anemia Acute GI bleed Acute colitis, ?  Ischemic - Hemoglobin 4.5 on admission FOBT positive, hemoglobin improved to 8.5 after 2 units, likely 4.5 was in error but still significant drop from 12-13 few weeks prior - Status post 2 unit PRBC on admission, now hemoglobin is stable - RX w metronidazole  and ceftriaxone  for presumed pneumonia as well, completed 7 days of ABX -No overt bleeding reported anymore - GI consulted, scheduled for EGD yesterday, subsequently adamant about refusing all procedures, GI signed off recommended  conservative management, and a follow-up CT in few weeks   Acute on chronic diastolic CHF -Echo this admission with EF 60-65%, indeterminate diastolic function, normal RV, no significant valvular heart disease -Suspect a fair bit her if her volume overload  was iatrogenic -Improved significantly with diuresis, now weaned off O2, switch to oral diuretics, continue ARB, atenolol  -Poor candidate for SGLT2i    Essential hypertension:  - Restart home medications as below including atenolol , hydralazine , irbesartan (substituted for home telmisartan)   AKI, resolved: Likely prerenal, downtrending appropriately with supportive care/transfusion   Severe hypokalemia Replete   Subacute pulmonary embolism:  - See discussion above     Code Status: DNR    Consultants:  P[facility]  Discharge Exam: Vitals:   08/24/24 0525 08/24/24 0739  BP: (!) 136/59 (!) 129/55  Pulse:  89  Resp:  20  Temp:  (!) 97.5 F (36.4 C)  SpO2:  93%   General: Elderly, chronically ill female, sitting up in bed, AAO x 2, mild cognitive deficits, no distress HEENT: no JVD CVS: S1S2/RRR Lungs: Improving air movement, clear Abdomen: Soft, nontender, bowel sounds present Extremities: Trace edema      Discharge Instructions    Allergies as of 08/24/2024       Reactions   Avapro  [irbesartan ] Other (See Comments)   Difficulty breathing, feeling like she was going to pass out   Calcium Channel Blockers    ineffective   Decongestant [pseudoephedrine] Other (See Comments)   Heart races , blood pressure goes up   Norvasc [amlodipine Besylate] Itching, Other (See Comments), Hypertension   Turned skin red   Sulfa Antibiotics Other (See Comments)   Made kidney infection worse   Zofran  [ondansetron ] Diarrhea   Only ODT tablets cause this problem.         Medication List     STOP taking these medications    amoxicillin -clavulanate 500-125 MG tablet Commonly known as: AUGMENTIN    cloNIDine  0.1 MG tablet Commonly known as: CATAPRES    famotidine  40 MG tablet Commonly known as: PEPCID    ibuprofen  200 MG tablet Commonly known as: ADVIL    IMODIUM  A-D PO   mineral oil enema   ondansetron  4 MG disintegrating tablet Commonly known as: ZOFRAN -ODT   oseltamivir  75 MG capsule Commonly known as: TAMIFLU    predniSONE 20 MG tablet Commonly known as: DELTASONE       TAKE these medications     apixaban  5 MG Tabs tablet Commonly known as: ELIQUIS  Take 1 tablet (5 mg total) by mouth 2 (two) times daily.   atenolol  25 MG tablet Commonly known as: TENORMIN  Take 25 mg by mouth every evening.   bisacodyl 10 MG suppository Commonly known as: DULCOLAX Place 10 mg rectally as needed for moderate constipation.   cholecalciferol  1000 units tablet Commonly known as: VITAMIN D  Take 2,000 Units by mouth daily.   CLEAR EYES OP Place 1 drop into both eyes daily as needed (dry eyes).   clonazePAM  0.5 MG tablet Commonly known as: KLONOPIN  Take 1 tablet (0.5 mg total) by mouth at bedtime.   escitalopram  10 MG tablet Commonly known as: LEXAPRO  Take 10 mg by mouth daily.   furosemide  20 MG tablet Commonly known as: LASIX  Take 1 tablet (20 mg total) by mouth daily. Start taking on: August 25, 2024   Glucerna Liqd Take 237 mLs by mouth daily.   hydrALAZINE  25 MG tablet Commonly known as: APRESOLINE  Take 1 tablet (25 mg total) by mouth 3 (three) times daily. What changed:  medication strength how much to take when to take this   ipratropium-albuterol  0.5-2.5 (3) MG/3ML Soln Commonly known as: DUONEB Take 3 mLs by nebulization every 6 (six) hours as needed.   Januvia 100 MG tablet Generic drug:  sitaGLIPtin Take 100 mg by mouth daily.   magnesium  hydroxide 400 MG/5ML suspension Commonly known as: MILK OF MAGNESIA Take 30 mLs by mouth daily as needed for mild constipation.   pantoprazole  40 MG tablet Commonly known as: PROTONIX  Take 1 tablet (40 mg total) by mouth daily. Start taking on: August 25, 2024   polyethylene glycol 17 g packet Commonly known as: MIRALAX  / GLYCOLAX  Take 17 g by mouth daily as needed for moderate constipation.   pravastatin  20 MG tablet Commonly known as: PRAVACHOL  Take 20 mg by mouth every other day.   telmisartan 80 MG tablet Commonly known as: MICARDIS Take 80 mg by mouth daily.       Allergies[1]  Contact information for  after-discharge care     Destination     [facility], [place] .   Service: Skilled Nursing Contact information: [address] [place]  [phone]                      The results of significant diagnostics from this hospitalization (including imaging, microbiology, ancillary and laboratory) are listed below for reference.    Significant Diagnostic Studies: DG CHEST PORT 1 VIEW Result Date: 08/24/2024 CLINICAL DATA:  Dyspnea. EXAM: PORTABLE CHEST 1 VIEW COMPARISON:  08/17/2024 FINDINGS: Compared to the previous examination, there is improved aeration at the lung bases. Probable scarring or mild atelectasis at both lung bases. Severe dextroscoliosis in the thoracic spine. Heart size is within normal limits. Atherosclerotic calcifications at the aortic arch. IMPRESSION: Probable scarring or atelectasis at both lung bases. Electronically Signed   By: Juliene Balder M.D.   On: 08/24/2024 07:59   ECHOCARDIOGRAM COMPLETE Result Date: 08/19/2024    ECHOCARDIOGRAM REPORT   Patient Name:   Leaner Kalley Nicholl Date of Exam: 08/19/2024 Medical Rec #:  [id number]          Height:       65.0 in Accession #:    [id number]         Weight:       149.0 lb Date of Birth:  1940-09-21           BSA:          1.746 m Patient Age:    83 years           BP:           149/68 mmHg Patient Gender: F                  HR:           84 bpm. Exam Location:  Inpatient Procedure: 2D Echo, Cardiac Doppler and Color Doppler (Both Spectral and Color            Flow Doppler were utilized during procedure). Indications:    CHF- Acute Diastolic I50.31  History:        Patient has no prior history of Echocardiogram examinations.                 Pulmonary Embolism; Risk Factors:Diabetes, Hypertension and                 GERD.  Sonographer:    Koleen Popper RDCS Referring Phys: SIGURD PAC  Sonographer Comments: Suboptimal parasternal window. Image acquisition challenging due to respiratory  motion. IMPRESSIONS  1. Left ventricular ejection fraction, by estimation, is 60 to 65%. The left ventricle has normal function. The left ventricle has no regional wall motion abnormalities.  Left ventricular diastolic parameters are consistent with Grade I diastolic dysfunction (impaired relaxation).  2. Right ventricular systolic function is normal. The right ventricular size is normal.  3. The mitral valve is normal in structure. No evidence of mitral valve regurgitation. No evidence of mitral stenosis.  4. The aortic valve is tricuspid. Aortic valve regurgitation is not visualized. No aortic stenosis is present.  5. The inferior vena cava is normal in size with greater than 50% respiratory variability, suggesting right atrial pressure of 3 mmHg. FINDINGS  Left Ventricle: Left ventricular ejection fraction, by estimation, is 60 to 65%. The left ventricle has normal function. The left ventricle has no regional wall motion abnormalities. The left ventricular internal cavity size was normal in size. There is  no left ventricular hypertrophy. Left ventricular diastolic parameters are consistent with Grade I diastolic dysfunction (impaired relaxation). Normal left ventricular filling pressure. Right Ventricle: The right ventricular size is normal. No increase in right ventricular wall thickness. Right ventricular systolic function is normal. Left Atrium: Left atrial size was normal in size. Right Atrium: Right atrial size was normal in size. Pericardium: There is no evidence of pericardial effusion. Mitral Valve: The mitral valve is normal in structure. No evidence of mitral valve regurgitation. No evidence of mitral valve stenosis. Tricuspid Valve: The tricuspid valve is normal in structure. Tricuspid valve regurgitation is not demonstrated. No evidence of tricuspid stenosis. Aortic Valve: The aortic valve is tricuspid. Aortic valve regurgitation is not visualized. No aortic stenosis is present. Pulmonic Valve: The  pulmonic valve was normal in structure. Pulmonic valve regurgitation is not visualized. No evidence of pulmonic stenosis. Aorta: The aortic root is normal in size and structure. Venous: The inferior vena cava is normal in size with greater than 50% respiratory variability, suggesting right atrial pressure of 3 mmHg. IAS/Shunts: No atrial level shunt detected by color flow Doppler.  LEFT VENTRICLE PLAX 2D LVIDd:         4.40 cm      Diastology LVIDs:         2.40 cm      LV e' medial:    4.46 cm/s LV PW:         0.80 cm      LV E/e' medial:  16.0 LV IVS:        0.80 cm      LV e' lateral:   10.10 cm/s LVOT diam:     1.70 cm      LV E/e' lateral: 7.1 LV SV:         53 LV SV Index:   30 LVOT Area:     2.27 cm  LV Volumes (MOD) LV vol d, MOD A2C: 109.0 ml LV vol d, MOD A4C: 87.7 ml LV vol s, MOD A2C: 41.4 ml LV vol s, MOD A4C: 34.8 ml LV SV MOD A2C:     67.6 ml LV SV MOD A4C:     87.7 ml LV SV MOD BP:      61.2 ml RIGHT VENTRICLE             IVC RV S prime:     14.80 cm/s  IVC diam: 1.20 cm TAPSE (M-mode): 1.6 cm LEFT ATRIUM             Index LA diam:        4.70 cm 2.69 cm/m LA Vol (A2C):   25.4 ml 14.55 ml/m LA Vol (A4C):   40.2 ml 23.03 ml/m LA Biplane Vol: 32.7  ml 18.73 ml/m  AORTIC VALVE LVOT Vmax:   134.00 cm/s LVOT Vmean:  85.600 cm/s LVOT VTI:    0.234 m  AORTA Ao Root diam: 2.60 cm MITRAL VALVE MV Area (PHT): 3.42 cm     SHUNTS MV Decel Time: 222 msec     Systemic VTI:  0.23 m MV E velocity: 71.30 cm/s   Systemic Diam: 1.70 cm MV A velocity: 106.00 cm/s MV E/A ratio:  0.67 Morene Brownie Electronically signed by Morene Brownie Signature Date/Time: 08/19/2024/1:27:34 PM    Final    DG CHEST PORT 1 VIEW Result Date: 08/18/2024 EXAM: 1 VIEW(S) XRAY OF THE CHEST 08/17/2024 12:37:21 PM COMPARISON: 08/12/2024 CLINICAL HISTORY: 200808 Hypoxia 200808 FINDINGS: LUNGS AND PLEURA: Low lung volumes. Bibasilar opacities. Increased bilateral pleural effusions. No pneumothorax. HEART AND MEDIASTINUM: Aortic  atherosclerosis. No acute abnormality of the cardiac and mediastinal silhouettes. BONES AND SOFT TISSUES: Thoracic dextroscoliosis. IMPRESSION: 1. Low lung volumes with bibasilar opacities and increased bilateral pleural effusions. Electronically signed by: Greig Pique MD 08/18/2024 01:23 AM EST RP Workstation: [id number]   VAS US  LOWER EXTREMITY VENOUS (DVT) (ONLY [facility] & [facility]) Result Date: 08/14/2024  Lower Venous DVT Study Patient Name:  RONNAE KASER  Date of Exam:   08/13/2024 Medical Rec #: [id number]           Accession #:    [id number] Date of Birth: [birth year]            Patient Gender: F Patient Age:   10 years Exam Location:  [facility] Procedure:      VAS US  LOWER EXTREMITY VENOUS (DVT) Referring Phys: HAYLEY NAASZ --------------------------------------------------------------------------------  Indications: Subacute to chronic PE by CTA of chest. Flu A. Other Indications: Recently hospitalized from 07/22/24 to 08/01/24 for pneumonia                    and sepsis. Presented 08/13/24 with acute hypoxic respiratory                    failure, colitis, and sepsis. Risk Factors: Cancer; History of basal cell carcinoma. Limitations: Patient condition/altered mental status, unwilling/unable to keep legs in proper position for adequate imaging, edema, and poor ultrasound/tissue interface. Comparison Study: No prior study on file Performing Technologist: Alberta Lis RVS  Examination Guidelines: A complete evaluation includes B-mode imaging, spectral Doppler, color Doppler, and power Doppler as needed of all accessible portions of each vessel. Bilateral testing is considered an integral part of a complete examination. Limited examinations for reoccurring indications may be performed as noted. The reflux portion of the exam is performed with the patient in reverse Trendelenburg.  +---------+---------------+---------+-----------+----------+-------------------+ RIGHT     CompressibilityPhasicitySpontaneityPropertiesThrombus Aging      +---------+---------------+---------+-----------+----------+-------------------+ CFV      Full           Yes      No                                       +---------+---------------+---------+-----------+----------+-------------------+ SFJ      Full                                                             +---------+---------------+---------+-----------+----------+-------------------+  FV Prox  Full           Yes      No                                       +---------+---------------+---------+-----------+----------+-------------------+ FV Mid   Full                                                             +---------+---------------+---------+-----------+----------+-------------------+ FV Distal               Yes      No                   patent by color and                                                       Doppler             +---------+---------------+---------+-----------+----------+-------------------+ PFV      Full           Yes      No                                       +---------+---------------+---------+-----------+----------+-------------------+ POP                     Yes      No                   patent by color and                                                       Doppler             +---------+---------------+---------+-----------+----------+-------------------+ PTV      Full                                                             +---------+---------------+---------+-----------+----------+-------------------+ PERO                                                  Not well visualized +---------+---------------+---------+-----------+----------+-------------------+   +---------+---------------+---------+-----------+----------+-------------------+ LEFT     CompressibilityPhasicitySpontaneityPropertiesThrombus Aging       +---------+---------------+---------+-----------+----------+-------------------+ CFV      Full           Yes      No                                       +---------+---------------+---------+-----------+----------+-------------------+  SFJ      Full           Yes      No                                       +---------+---------------+---------+-----------+----------+-------------------+ FV Prox                 Yes      No                   patent by color and                                                       Doppler             +---------+---------------+---------+-----------+----------+-------------------+ FV Mid   Full           Yes      No                                       +---------+---------------+---------+-----------+----------+-------------------+ FV Distal               Yes      No                                       +---------+---------------+---------+-----------+----------+-------------------+ PFV                                                   patent by color     +---------+---------------+---------+-----------+----------+-------------------+ POP      Full           Yes      No                                       +---------+---------------+---------+-----------+----------+-------------------+ PTV                                                   Not well visualized +---------+---------------+---------+-----------+----------+-------------------+ PERO     None                                         Age Indeterminate   +---------+---------------+---------+-----------+----------+-------------------+     Summary: RIGHT: - There is no evidence of deep vein thrombosis in the lower extremity. However, portions of this examination were limited- see technologist comments above.  Pulsatile waveforms noted throughout  LEFT: - Findings consistent with age indeterminate deep vein thrombosis involving the left peroneal veins.   Pulsatile waveforms noted throughout.  *See table(s) above for measurements and observations. Electronically signed by Debby Robertson on  08/14/2024 at 11:00:51 AM.    Final    CT Angio Chest/Abd/Pel for Dissection W and/or Wo Contrast Result Date: 08/12/2024 EXAM: CTA CHEST, ABDOMEN AND PELVIS WITH AND WITHOUT CONTRAST 08/12/2024 07:46:15 PM TECHNIQUE: CTA of the chest was performed with and without the administration of intravenous contrast. CTA of the abdomen and pelvis was performed with and without the administration of intravenous contrast. 100 mL of iohexol  (OMNIPAQUE ) 350 MG/ML injection was administered. Multiplanar reformatted images are provided for review. MIP images are provided for review. Automated exposure control, iterative reconstruction, and/or weight based adjustment of the mA/kV was utilized to reduce the radiation dose to as low as reasonably achievable. COMPARISON: 07/22/2024 CLINICAL HISTORY: acute blood loss anemia, pneumonia, gastrointestinal hemorrhage FINDINGS: VASCULATURE: AORTA: Moderate atherosclerotic calcification within the thoracic aorta. No aortic aneurysm. No dissection. No abdominal aortic aneurysm. PULMONARY ARTERIES: Intraluminal filling defect within the right upper lobar pulmonary artery centrally (65/7) in keeping with a pulmonary embolus, likely subacute to chronic in nature given its somewhat irregular contour and mural eccentricity. Central pulmonary arteries are of normal caliber. GREAT VESSELS OF AORTIC ARCH: No acute finding. No dissection. No arterial occlusion or significant stenosis. 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 Superior mesenteric artery and inferior mesenteric arteries are widely patent at their origin making this a questionable clinical significance. SUPERIOR MESENTERIC ARTERY: Widely patent at its origin. No occlusion or significant stenosis. INFERIOR MESENTERIC ARTERY: Widely patent. No occlusion or  significant stenosis. RENAL ARTERIES: No acute finding. No occlusion or significant stenosis. ILIAC ARTERIES: Moderate aortoiliac atherosclerotic calcification. No occlusion or significant stenosis. CHEST: MEDIASTINUM: Mild coronary artery calcification. Global cardiac size within normal limits. No CT evidence of right heart strain. No pericardial effusion. No mediastinal lymphadenopathy. LUNGS AND PLEURA: Mild interstitial pulmonary edema. Small right and trace left pleural effusions with associated bibasilar compressive atelectasis. Superimposed consolidation within the peripheral right lower lobe may reflect changes of acute infection or aspiration. No pneumothorax. THORACIC BONES AND SOFT TISSUES: No acute bone or soft tissue abnormality. ABDOMEN AND PELVIS: LIVER: The liver is unremarkable. GALLBLADDER AND BILE DUCTS: Cholelithiasis without superimposed pericholecystic inflammatory change. No intra or extrahepatic biliary ductal dilation. SPLEEN: The spleen is unremarkable. PANCREAS: The pancreas is unremarkable. ADRENAL GLANDS: Bilateral adrenal glands demonstrate no acute abnormality. KIDNEYS, URETERS AND BLADDER: Foley catheter balloon seen within a decompressed bladder lumen. No stones in the kidneys or ureters. No hydronephrosis. No perinephric or periureteral stranding. GI AND BOWEL: Moderate sigmoid diverticulosis. There is circumferential mild bowel wall thickening and pericolonic inflammatory stranding involving the descending colon which may reflect changes of a infectious, inflammatory, or potentially ischemic colitis. The inferior mesenteric, sigmoid, and left colic artery are widely patent, however, making the latter consideration less likely. Note that the examination is limited as venous phase imaging was not performed. The stomach, small bowel, and large bowel are otherwise unremarkable. There is no bowel obstruction. No abnormal bowel wall thickening or distension. REPRODUCTIVE: Reproductive  organs are unremarkable. PERITONEUM AND RETROPERITONEUM: No ascites or free air. LYMPH NODES: No lymphadenopathy. ABDOMINAL BONES AND SOFT TISSUES: Severe thoracolumbar sigmoid scoliosis. Osseous structures are age appropriate. No acute bone abnormality. No lytic or blastic bone lesion. No acute soft tissue abnormality. IMPRESSION: 1. Circumferential mild bowel wall thickening and pericolonic inflammatory stranding involving the descending colon, most consistent with colitis; ischemic colitis is considered less likely, though evaluation is limited without venous phase imaging. 2. Peripheral right lower lobe consolidation, which may reflect  infection or aspiration. 3. Pulmonary embolus in the central right upper lobar pulmonary artery, likely subacute to chronic in nature, without CT evidence of right heart strain. 4. 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 5. Chronic findings include coronary artery calcification and systemic atherosclerosis, cholelithiasis, sigmoid diverticulosis, and thoracolumbar scoliosis. Electronically signed by: Dorethia Molt MD 08/12/2024 08:12 PM EST RP Workstation: [id number]   DG Chest Portable 1 View Result Date: 08/12/2024 EXAM: 1 VIEW(S) XRAY OF THE CHEST 08/12/2024 04:39:00 PM COMPARISON: 07/23/2024 CLINICAL HISTORY: sob FINDINGS: LUNGS AND PLEURA: New right basilar airspace opacity. No pleural effusion. No pneumothorax. HEART AND MEDIASTINUM: Tortuous thoracic aorta. Atherosclerotic calcifications. BONES AND SOFT TISSUES: Right convex thoracic scoliosis. IMPRESSION: 1. New right basilar airspace opacity, suspicious for pneumonia. 2. Tortuous thoracic aorta with atherosclerotic calcifications. Electronically signed by: Greig Pique MD 08/12/2024 05:44 PM EST RP Workstation: [id number]    Microbiology: No results found for this or any previous visit (from the past 240  hours).   Labs: Basic Metabolic Panel: Recent Labs  Lab 08/17/24 1136 08/18/24 0307 08/19/24 0254 08/20/24 0949 08/21/24 0400 08/22/24 0510 08/23/24 0428  NA 130*   < > 135 133* 132* 133* 135  K 3.7   < > 4.0 3.8 3.6 3.5 3.6  CL 95*   < > 98 96* 94* 95* 98  CO2 27   < > 31 29 31 27 28   GLUCOSE 165*   < > 103* 193* 139* 138* 145*  BUN 12   < > 9 8 8 12 15   CREATININE 0.73   < > 0.70 0.79 0.84 0.92 0.90  CALCIUM 7.8*   < > 8.1* 8.7* 8.9 9.2 9.1  MG 1.5*  --   --   --   --   --   --    < > = values in this interval not displayed.   Liver Function Tests: No results for input(s): AST, ALT, ALKPHOS, BILITOT, PROT, ALBUMIN in the last 168 hours. No results for input(s): LIPASE, AMYLASE in the last 168 hours. No results for input(s): AMMONIA in the last 168 hours. CBC: Recent Labs  Lab 08/20/24 0535 08/21/24 0400 08/22/24 0510 08/23/24 0428 08/24/24 0436  WBC 6.3 6.1 5.9 7.5 6.9  HGB 8.7* 8.7* 8.9* 8.5* 8.2*  HCT 26.6* 26.9* 27.3* 25.6* 25.3*  MCV 93.3 93.4 92.9 92.8 93.7  PLT 395 381 384 355 325   Cardiac Enzymes: No results for input(s): CKTOTAL, CKMB, CKMBINDEX, TROPONINI in the last 168 hours. BNP: BNP (last 3 results) No results for input(s): BNP in the last 8760 hours.  ProBNP (last 3 results) Recent Labs    08/12/24 1618  PROBNP 5,942.0*    CBG: Recent Labs  Lab 08/23/24 0829 08/23/24 1247 08/23/24 1636 08/23/24 2051 08/24/24 0744  GLUCAP 146* 199* 175* 189* 187*       Signed:  Sigurd Pac MD.  [facility] 08/24/2024, 10:59 AM        [1]  Allergies Allergen Reactions   Avapro  [Irbesartan ] Other (See Comments)    Difficulty breathing, feeling like she was going to pass out   Calcium Channel Blockers     ineffective   Decongestant [Pseudoephedrine] Other (See Comments)    Heart races , blood pressure goes up   Norvasc [Amlodipine Besylate] Itching, Other (See Comments) and Hypertension    Turned  skin red   Sulfa Antibiotics Other (See Comments)  Made kidney infection worse   Zofran  [Ondansetron ] Diarrhea    Only ODT tablets cause this problem.

## 2024-08-24 NOTE — TOC Progression Note (Addendum)
 Transition of Care [facility]) - Progression Note    Patient Details  Name: Monica Faulkner MRN: [id number] Date of Birth: 1941-02-27  [facility]) CM/SW Contact  Isaiah Public, LCSWA Phone Number: 08/24/2024, 11:26 AM  Clinical Narrative:     Insurance authorization is pending. Patient has SNF bed at [facility]. Patient request CSW to follow up on status of Medicaid screening that was requested from a previous admission. CSW reached out to [place] in financial counseling and request to check status of patients medicaid screening.CSW will continue to follow. CSW will continue to follow.  Nat Chang with APS request call from CSW.  Nat with APS informed CSW that patient has open APS case .Contact information. Nat tele# [phone].   Expected Discharge Plan: Skilled Nursing Facility Barriers to Discharge: Continued Medical Work up, English As A Second Language Teacher               Expected Discharge Plan and Services In-house Referral: Clinical Social Work   Post Acute Care Choice: Skilled Nursing Facility Living arrangements for the past 2 months: Single Family Home, Skilled Nursing Facility                                       Social Drivers of Health (SDOH) Interventions SDOH Screenings   Food Insecurity: Patient Unable To Answer (07/23/2024)  Housing: Patient Unable To Answer (07/23/2024)  Transportation Needs: Patient Unable To Answer (07/23/2024)  Utilities: Patient Unable To Answer (07/23/2024)  Social Connections: Patient Unable To Answer (07/23/2024)  Tobacco Use: Low Risk (08/20/2024)    Readmission Risk Interventions     No data to display

## 2024-08-24 NOTE — Consult Note (Signed)
 "                                                                                   Consultation Note Date: 08/24/2024   Patient Name: Monica Faulkner  DOB: January 19, 1941  MRN: [id number]  Age / Sex: 84 y.o., female  PCP: Verdia Lombard, MD Referring Physician: Fairy Frames, MD  Reason for Consultation:  goals of care, complex hospitalization for acute on chronic respiratory failure, hyponatremia, metabolic encephalopathy  HPI/Patient Profile: 84 y.o. female  with past medical history of DM2, GERD, HTN, anxiety disorder, depression hospitalized 12/5-12/11 for sepsis due to community acquired pneumonia and discharged to rehab facility, now admitted on 08/12/2024 with recurrent pneumonia and influenza. Admission was also complicated by anemia and GI bleeding with FOBT positive. Patient declined to have EGD to evaluate for source. Palliative medicine consulted for goals of care in patient with complex hospitalization.    Primary Decision Maker PATIENT  Discussion: Case discussed with Dr. Fairy- patient is stabilizing towards discharge, concerns for recurrence of GI bleeding and patient declining procedure to evaluate.  I reviewed patient's chart- labs from 1/7 reviewed-  Hgb today is 8.2. WBC is normal 6.9. Per [facility] Social worker note patient has bed offer at [facility], insurance shara is pending.  On evaluation patient is awake, alert and oriented. Able to tell me she is in the hospital due to pneumonia and influenza. She feels that she is improving.  Prior to hospitalization in early December she was living at home alone with her cat. She has many neighbors who support her. She was mostly independent for all of her ADL's. She enjoys classical music.  If she were unable to make medical decisions then she would prefer for her brother, [name], to be management consultant for her.  We discussed her acute pneumonia/flu/GI bleeding. Option for endoscopy was reviewed and she noted that if she had  bleeding that occurred then she would agree to procedure but does not feel that she needs it at this time.  If her condition were to worsen after she leaves the hospital she would want for rehospitalization.  She has a do not resuscitate order in place.  Her goals are to return to rehab with eventual hope if returning home to live with her cat.    SUMMARY OF RECOMMENDATIONS  -Sepsis due to pneumonia- improving but patient is deconditioned- her goals are to continue aggressive medical interventions, rehospitalization ok, go to rehab and eventually home, limit set at DNR -TOC order placed for referral to outpatient Palliative medicine -PMT will continue to monitor for progress vs decompensation until discharge  Code Status/Advance Care Planning:   Code Status: Limited: Do not attempt resuscitation (DNR) -DNR-LIMITED -Do Not Intubate/DNI     Prognosis:   Unable to determine  Discharge Planning: To Be Determined  Primary Diagnoses: Present on Admission:  Acute metabolic encephalopathy  GERD (gastroesophageal reflux disease)  Essential hypertension  GI bleed  Symptomatic anemia  Acute colitis  Acute pulmonary embolism (HCC)  Influenza A with pneumonia  Acute on chronic hypoxic respiratory failure (HCC)  Hyponatremia   Review of Systems  Constitutional:  Positive for activity change and  fatigue.  Respiratory:  Positive for cough.     Physical Exam Vitals and nursing note reviewed.  Constitutional:      Appearance: She is ill-appearing.     Comments: frail  Cardiovascular:     Rate and Rhythm: Normal rate.  Pulmonary:     Effort: Pulmonary effort is normal.  Skin:    General: Skin is warm and dry.  Neurological:     Mental Status: She is oriented to person, place, and time.     Vital Signs: BP (!) 149/61 (BP Location: Left Arm)   Pulse 75   Temp 97.6 F (36.4 C) (Oral)   Resp (!) 22   Ht 5' 5 (1.651 m)   Wt 61.1 kg   SpO2 93%   BMI 22.42 kg/m  Pain Scale:  0-10 POSS *See Group Information*: S-Acceptable,Sleep, easy to arouse Pain Score: 0-No pain   SpO2: SpO2: 93 % O2 Device:SpO2: 93 % O2 Flow Rate: .O2 Flow Rate (L/min): 4 L/min  IO: Intake/output summary:  Intake/Output Summary (Last 24 hours) at 08/24/2024 1321 Last data filed at 08/24/2024 1020 Gross per 24 hour  Intake 240 ml  Output 300 ml  Net -60 ml    LBM: Last BM Date : 08/23/24 Baseline Weight: Weight: 60.5 kg Most recent weight: Weight: 61.1 kg       Thank you for this consult. Palliative medicine will continue to follow and assist as needed.  Time Total: 60 minutes  Signed by: Cassondra Stain, AGNP-C Palliative Medicine  Time includes:   I personally spent a total of 60 minutes in the care of the patient today including preparing to see the patient, getting/reviewing separately obtained history, performing a medically appropriate exam/evaluation, counseling and educating, placing orders, referring and communicating with other health care professionals, and documenting clinical information in the EHR .   Please contact Palliative Medicine Team phone at [phone] for questions and concerns.  For individual provider: See Amion               "

## 2024-08-24 NOTE — Progress Notes (Signed)
 Occupational Therapy Treatment Patient Details Name: Monica Faulkner MRN: [id number] DOB: 1941/07/17 Today's Date: 08/24/2024   History of present illness Pt is an 84 y/o F presenting to [facility] on 08/12/24 from rehab facility due to flue/pneumonia with profound hypoxia and respiratory distress.Pt found to be anemic as well. Subacute PE and age indeterminate DVT of the L peroneal vein found as well. CT positive for colitis which MD believes is most likely reason for anemia. Recently discharged on 12/15 in the setting of AKI, lactic acidosis and dehydration with metabolic encephalopathy.   PMHx: HTN, DM, anxiety, basal cell carcinoma, GERD, anxiety, depression   OT comments  Patient completed supine to sitting EOB wit mod Ax1, unsupported sitting EOB completed with CGA with R lateral lean noted although patient able to completed UB grooming task of washing face and oral hygiene with Supervision s/p setup.  Patient returned to supine in bed with mod A and left with all needs within reach.  Patient would benefit from additional OT intervention to address functional deficits in order for patient to return to PLOF      If plan is discharge home, recommend the following:  A lot of help with bathing/dressing/bathroom;Assistance with cooking/housework;Direct supervision/assist for medications management;Assist for transportation;Direct supervision/assist for financial management;Help with stairs or ramp for entrance;Supervision due to cognitive status;A little help with walking and/or transfers   Equipment Recommendations       Recommendations for Other Services      Precautions / Restrictions Precautions Precautions: Fall Recall of Precautions/Restrictions: Impaired Precaution/Restrictions Comments: monitor BP, HR Restrictions Weight Bearing Restrictions Per Provider Order: No       Mobility Bed Mobility Overal bed mobility: Needs Assistance Bed Mobility: Supine to Sit, Sit to Supine      Supine to sit: Mod assist          Transfers                         Balance Overall balance assessment: Needs assistance Sitting-balance support: Single extremity supported, Feet supported Sitting balance-Leahy Scale: Poor   Postural control: Right lateral lean                                 ADL either performed or assessed with clinical judgement   ADL Overall ADL's : Needs assistance/impaired Eating/Feeding: Set up;Sitting   Grooming: Sitting;Wash/dry hands;Wash/dry face;Oral care;Contact guard assist           Upper Body Dressing : Total assistance;Sitting (donning socks)                          Extremity/Trunk Assessment              Vision       Perception     Praxis     Communication     Cognition Arousal: Alert Behavior During Therapy: Flat affect                                          Cueing      Exercises      Shoulder Instructions       General Comments      Pertinent Vitals/ Pain       Pain Assessment Pain Assessment: No/denies pain  Home Living  Prior Functioning/Environment              Frequency  Min 1X/week        Progress Toward Goals  OT Goals(current goals can now be found in the care plan section)  Progress towards OT goals: Progressing toward goals  Acute Rehab OT Goals OT Goal Formulation: With patient Time For Goal Achievement: 08/31/24 Potential to Achieve Goals: Fair ADL Goals Pt Will Perform Grooming: with supervision;sitting Pt Will Perform Upper Body Bathing: with min assist;sitting Pt Will Perform Lower Body Bathing: with mod assist;sitting/lateral leans Pt Will Transfer to Toilet: with mod assist;with +2 assist;squat pivot transfer;bedside commode Pt Will Perform Toileting - Clothing Manipulation and hygiene: with mod assist;sitting/lateral leans;sit to/from stand Pt/caregiver  will Perform Home Exercise Program: Increased strength;Both right and left upper extremity;With Supervision Additional ADL Goal #1: Pt will tolerate sitting EOB with no more than close supervision in preparation for ADLs EOB.  Plan      Co-evaluation                 AM-PAC OT 6 Clicks Daily Activity     Outcome Measure   Help from another person eating meals?: A Little Help from another person taking care of personal grooming?: A Little Help from another person toileting, which includes using toliet, bedpan, or urinal?: Total Help from another person bathing (including washing, rinsing, drying)?: A Lot Help from another person to put on and taking off regular upper body clothing?: A Lot Help from another person to put on and taking off regular lower body clothing?: Total 6 Click Score: 12    End of Session    OT Visit Diagnosis: Unsteadiness on feet (R26.81);Other abnormalities of gait and mobility (R26.89);Other symptoms and signs involving cognitive function   Activity Tolerance Patient tolerated treatment well   Patient Left in bed;with call bell/phone within reach;with bed alarm set   Nurse Communication Mobility status        Time: 1532-1550 OT Time Calculation (min): 18 min  Charges: OT General Charges $OT Visit: 1 Visit OT Treatments $Self Care/Home Management : 8-22 mins  Lamarr Pouch OT/L  Lamarr JONETTA Pouch 08/24/2024, 4:07 PM

## 2024-08-24 NOTE — Discharge Instructions (Signed)
Information on my medicine - ELIQUIS (apixaban)  This medication education was reviewed with me or my healthcare representative as part of my discharge preparation.  The pharmacist that spoke with me during my hospital stay was:  Mosetta Anis, [facility]  Why was Eliquis prescribed for you? Eliquis was prescribed to treat blood clots that may have been found in the veins of your legs (deep vein thrombosis) or in your lungs (pulmonary embolism) and to reduce the risk of them occurring again.  What do You need to know about Eliquis ? The dose is ONE 5 mg tablet taken TWICE daily.  Eliquis may be taken with or without food.   Try to take the dose about the same time in the morning and in the evening. If you have difficulty swallowing the tablet whole please discuss with your pharmacist how to take the medication safely.  Take Eliquis exactly as prescribed and DO NOT stop taking Eliquis without talking to the doctor who prescribed the medication.  Stopping may increase your risk of developing a new blood clot.  Refill your prescription before you run out.  After discharge, you should have regular check-up appointments with your healthcare provider that is prescribing your Eliquis.    What do you do if you miss a dose? If a dose of ELIQUIS is not taken at the scheduled time, take it as soon as possible on the same day and twice-daily administration should be resumed. The dose should not be doubled to make up for a missed dose.  Important Safety Information A possible side effect of Eliquis is bleeding. You should call your healthcare provider right away if you experience any of the following: ? Bleeding from an injury or your nose that does not stop. ? Unusual colored urine (red or dark brown) or unusual colored stools (red or black). ? Unusual bruising for unknown reasons. ? A serious fall or if you hit your head (even if there is no bleeding).  Some medicines may interact with  Eliquis and might increase your risk of bleeding or clotting while on Eliquis. To help avoid this, consult your healthcare provider or pharmacist prior to using any new prescription or non-prescription medications, including herbals, vitamins, non-steroidal anti-inflammatory drugs (NSAIDs) and supplements.  This website has more information on Eliquis (apixaban): http://www.eliquis.com/eliquis/home

## 2024-08-25 LAB — GLUCOSE, CAPILLARY
Glucose-Capillary: 166 mg/dL — ABNORMAL HIGH (ref 70–99)
Glucose-Capillary: 258 mg/dL — ABNORMAL HIGH (ref 70–99)
Glucose-Capillary: 242 mg/dL — ABNORMAL HIGH (ref 70–99)

## 2024-08-25 MED ORDER — SODIUM CHLORIDE 0.9 % IV SOLN: INTRAVENOUS | Status: DC

## 2024-08-25 NOTE — TOC Transition Note (Addendum)
 Transition of Care [facility]) - Discharge Note   Patient Details  Name: Monica Faulkner MRN: [id number] Date of Birth: 19-Aug-1940  Transition of Care [facility]) CM/SW Contact:  Jeoffrey LITTIE [name] Phone Number: 08/25/2024, 3:13 PM   Clinical Narrative:    Patient will DC to: [facility]  Anticipated DC date: 08/25/24 Family notified: Yes Transport by: [facility]   Per MD patient ready for DC to [facility]. RN to call report prior to discharge [phone] Room 117A. RN, patient, patient's family, and facility notified of DC. Discharge Summary and FL2 sent to facility. DC packet on chart. Ambulance transport requested for patient.   CSW will sign off for now as social work intervention is no longer needed. Please consult us  again if new needs arise.     Final next level of care: Skilled Nursing Facility Barriers to Discharge: Barriers Resolved   Patient Goals and CMS Choice Patient states their goals for this hospitalization and ongoing recovery are:: Return to [facility]          Discharge Placement   Existing PASRR number confirmed : 08/25/24          Patient chooses bed at: [facility] Patient to be transferred to facility by: [facility] Name of family member notified: Renee Patient and family notified of of transfer: 08/25/24  Discharge Plan and Services Additional resources added to the After Visit Summary for   In-house Referral: Clinical Social Work   Post Acute Care Choice: Skilled Nursing Facility                               Social Drivers of Health (SDOH) Interventions SDOH Screenings   Food Insecurity: Patient Unable To Answer (07/23/2024)  Housing: Patient Unable To Answer (07/23/2024)  Transportation Needs: Patient Unable To Answer (07/23/2024)  Utilities: Patient Unable To Answer (07/23/2024)  Social Connections: Patient Unable To Answer (07/23/2024)  Tobacco Use: Low Risk (08/20/2024)     Readmission Risk Interventions     No data to display

## 2024-08-25 NOTE — TOC Progression Note (Signed)
 Transition of Care [facility]) - Progression Note    Patient Details  Name: Monica Faulkner MRN: [id number] Date of Birth: December 06, 1940  Transition of Care [facility]) CM/SW Contact  [name] [name], [place] Phone Number: 08/25/2024, 2:33 PM  Clinical Narrative:    CSW received insurance auth approval for [name] ID [id number] effective 1/7-1/9. Pt name is spelled [name] in Navi. CSW will continue to follow.   Expected Discharge Plan: Skilled Nursing Facility Barriers to Discharge: Continued Medical Work up, English As A Second Language Teacher               Expected Discharge Plan and Services In-house Referral: Clinical Social Work   Post Acute Care Choice: Skilled Nursing Facility Living arrangements for the past 2 months: Single Family Home, Skilled Nursing Facility                                       Social Drivers of Health (SDOH) Interventions SDOH Screenings   Food Insecurity: Patient Unable To Answer (07/23/2024)  Housing: Patient Unable To Answer (07/23/2024)  Transportation Needs: Patient Unable To Answer (07/23/2024)  Utilities: Patient Unable To Answer (07/23/2024)  Social Connections: Patient Unable To Answer (07/23/2024)  Tobacco Use: Low Risk (08/20/2024)    Readmission Risk Interventions     No data to display
